# Patient Record
Sex: Female | Born: 1935
Health system: Southern US, Community
[De-identification: ages and names within clinical notes are randomized; demographics above are authoritative.]

## PROBLEM LIST (undated history)

## (undated) DIAGNOSIS — Z8489 Family history of other specified conditions: Secondary | ICD-10-CM

## (undated) DIAGNOSIS — M1990 Unspecified osteoarthritis, unspecified site: Secondary | ICD-10-CM

## (undated) DIAGNOSIS — Z9889 Other specified postprocedural states: Secondary | ICD-10-CM

## (undated) DIAGNOSIS — I5042 Chronic combined systolic (congestive) and diastolic (congestive) heart failure: Secondary | ICD-10-CM

## (undated) DIAGNOSIS — D62 Acute posthemorrhagic anemia: Secondary | ICD-10-CM

## (undated) DIAGNOSIS — K219 Gastro-esophageal reflux disease without esophagitis: Secondary | ICD-10-CM

## (undated) DIAGNOSIS — I4891 Unspecified atrial fibrillation: Secondary | ICD-10-CM

## (undated) DIAGNOSIS — R0602 Shortness of breath: Secondary | ICD-10-CM

## (undated) DIAGNOSIS — K922 Gastrointestinal hemorrhage, unspecified: Secondary | ICD-10-CM

## (undated) DIAGNOSIS — I050 Rheumatic mitral stenosis: Secondary | ICD-10-CM

## (undated) DIAGNOSIS — I272 Pulmonary hypertension, unspecified: Secondary | ICD-10-CM

## (undated) DIAGNOSIS — I639 Cerebral infarction, unspecified: Secondary | ICD-10-CM

## (undated) DIAGNOSIS — I10 Essential (primary) hypertension: Secondary | ICD-10-CM

## (undated) DIAGNOSIS — E785 Hyperlipidemia, unspecified: Secondary | ICD-10-CM

## (undated) DIAGNOSIS — R350 Frequency of micturition: Secondary | ICD-10-CM

## (undated) DIAGNOSIS — D649 Anemia, unspecified: Secondary | ICD-10-CM

## (undated) HISTORY — DX: Chronic combined systolic (congestive) and diastolic (congestive) heart failure: I50.42

## (undated) HISTORY — PX: PATENT FORAMEN OVALE CLOSURE: SHX2181

## (undated) HISTORY — DX: Other specified postprocedural states: Z98.890

## (undated) HISTORY — DX: Pulmonary hypertension, unspecified: I27.20

## (undated) HISTORY — DX: Acute posthemorrhagic anemia: D62

## (undated) HISTORY — PX: ABDOMINAL HYSTERECTOMY: SHX81

## (undated) HISTORY — DX: Rheumatic mitral stenosis: I05.0

## (undated) HISTORY — DX: Anemia, unspecified: D64.9

## (undated) HISTORY — PX: EYE SURGERY: SHX253

## (undated) HISTORY — PX: CHOLECYSTECTOMY: SHX55

## (undated) HISTORY — DX: Gastrointestinal hemorrhage, unspecified: K92.2

## (undated) HISTORY — DX: Cerebral infarction, unspecified: I63.9

---

## 2003-02-21 ENCOUNTER — Other Ambulatory Visit: Admission: RE | Admit: 2003-02-21 | Discharge: 2003-02-21 | Payer: Self-pay | Admitting: Obstetrics and Gynecology

## 2003-02-28 ENCOUNTER — Ambulatory Visit (HOSPITAL_COMMUNITY): Admission: RE | Admit: 2003-02-28 | Discharge: 2003-02-28 | Payer: Self-pay | Admitting: Obstetrics and Gynecology

## 2003-02-28 ENCOUNTER — Encounter: Payer: Self-pay | Admitting: Obstetrics and Gynecology

## 2003-07-16 ENCOUNTER — Encounter: Payer: Self-pay | Admitting: Emergency Medicine

## 2003-07-16 ENCOUNTER — Emergency Department (HOSPITAL_COMMUNITY): Admission: EM | Admit: 2003-07-16 | Discharge: 2003-07-16 | Payer: Self-pay | Admitting: Emergency Medicine

## 2004-02-03 ENCOUNTER — Inpatient Hospital Stay (HOSPITAL_COMMUNITY): Admission: EM | Admit: 2004-02-03 | Discharge: 2004-02-04 | Payer: Self-pay | Admitting: Emergency Medicine

## 2004-02-03 ENCOUNTER — Encounter (INDEPENDENT_AMBULATORY_CARE_PROVIDER_SITE_OTHER): Payer: Self-pay | Admitting: Specialist

## 2004-10-16 ENCOUNTER — Ambulatory Visit (HOSPITAL_COMMUNITY): Admission: RE | Admit: 2004-10-16 | Discharge: 2004-10-16 | Payer: Self-pay | Admitting: Cardiovascular Disease

## 2004-11-20 ENCOUNTER — Inpatient Hospital Stay (HOSPITAL_COMMUNITY): Admission: AD | Admit: 2004-11-20 | Discharge: 2004-11-22 | Payer: Self-pay | Admitting: Cardiovascular Disease

## 2004-11-21 ENCOUNTER — Encounter (INDEPENDENT_AMBULATORY_CARE_PROVIDER_SITE_OTHER): Payer: Self-pay | Admitting: Specialist

## 2005-01-23 ENCOUNTER — Ambulatory Visit (HOSPITAL_COMMUNITY): Admission: RE | Admit: 2005-01-23 | Discharge: 2005-01-23 | Payer: Self-pay | Admitting: Gastroenterology

## 2005-01-23 ENCOUNTER — Encounter (INDEPENDENT_AMBULATORY_CARE_PROVIDER_SITE_OTHER): Payer: Self-pay | Admitting: *Deleted

## 2006-10-18 ENCOUNTER — Encounter: Admission: RE | Admit: 2006-10-18 | Discharge: 2006-10-18 | Payer: Self-pay | Admitting: Cardiovascular Disease

## 2007-08-12 ENCOUNTER — Observation Stay (HOSPITAL_COMMUNITY): Admission: AD | Admit: 2007-08-12 | Discharge: 2007-08-13 | Payer: Self-pay | Admitting: Cardiovascular Disease

## 2008-01-29 ENCOUNTER — Encounter: Admission: RE | Admit: 2008-01-29 | Discharge: 2008-01-29 | Payer: Self-pay | Admitting: Cardiovascular Disease

## 2008-02-14 ENCOUNTER — Encounter: Admission: RE | Admit: 2008-02-14 | Discharge: 2008-02-14 | Payer: Self-pay | Admitting: Cardiovascular Disease

## 2008-02-23 ENCOUNTER — Inpatient Hospital Stay (HOSPITAL_COMMUNITY): Admission: EM | Admit: 2008-02-23 | Discharge: 2008-02-25 | Payer: Self-pay | Admitting: Emergency Medicine

## 2008-12-16 DIAGNOSIS — I4891 Unspecified atrial fibrillation: Secondary | ICD-10-CM

## 2008-12-16 DIAGNOSIS — K922 Gastrointestinal hemorrhage, unspecified: Secondary | ICD-10-CM

## 2008-12-16 HISTORY — DX: Unspecified atrial fibrillation: I48.91

## 2008-12-16 HISTORY — DX: Gastrointestinal hemorrhage, unspecified: K92.2

## 2008-12-23 ENCOUNTER — Inpatient Hospital Stay (HOSPITAL_COMMUNITY): Admission: AD | Admit: 2008-12-23 | Discharge: 2008-12-26 | Payer: Self-pay | Admitting: Cardiovascular Disease

## 2008-12-26 ENCOUNTER — Encounter (INDEPENDENT_AMBULATORY_CARE_PROVIDER_SITE_OTHER): Payer: Self-pay | Admitting: Gastroenterology

## 2009-04-26 ENCOUNTER — Encounter (INDEPENDENT_AMBULATORY_CARE_PROVIDER_SITE_OTHER): Payer: Self-pay | Admitting: Cardiovascular Disease

## 2009-04-26 ENCOUNTER — Ambulatory Visit (HOSPITAL_COMMUNITY): Admission: RE | Admit: 2009-04-26 | Discharge: 2009-04-26 | Payer: Self-pay | Admitting: Cardiovascular Disease

## 2009-05-03 ENCOUNTER — Ambulatory Visit (HOSPITAL_COMMUNITY): Admission: RE | Admit: 2009-05-03 | Discharge: 2009-05-03 | Payer: Self-pay | Admitting: Cardiovascular Disease

## 2009-06-09 ENCOUNTER — Ambulatory Visit: Payer: Self-pay | Admitting: Thoracic Surgery (Cardiothoracic Vascular Surgery)

## 2009-06-30 ENCOUNTER — Encounter
Admission: RE | Admit: 2009-06-30 | Discharge: 2009-06-30 | Payer: Self-pay | Admitting: Thoracic Surgery (Cardiothoracic Vascular Surgery)

## 2009-07-03 ENCOUNTER — Ambulatory Visit: Payer: Self-pay | Admitting: Thoracic Surgery (Cardiothoracic Vascular Surgery)

## 2009-07-05 ENCOUNTER — Ambulatory Visit: Payer: Self-pay | Admitting: Thoracic Surgery (Cardiothoracic Vascular Surgery)

## 2009-07-05 ENCOUNTER — Encounter: Payer: Self-pay | Admitting: Thoracic Surgery (Cardiothoracic Vascular Surgery)

## 2009-07-05 ENCOUNTER — Inpatient Hospital Stay (HOSPITAL_COMMUNITY)
Admission: RE | Admit: 2009-07-05 | Discharge: 2009-07-10 | Payer: Self-pay | Admitting: Thoracic Surgery (Cardiothoracic Vascular Surgery)

## 2009-07-05 DIAGNOSIS — Z9889 Other specified postprocedural states: Secondary | ICD-10-CM

## 2009-07-05 HISTORY — PX: MITRAL VALVE REPAIR: SHX2039

## 2009-07-05 HISTORY — DX: Other specified postprocedural states: Z98.890

## 2009-07-12 ENCOUNTER — Ambulatory Visit: Payer: Self-pay | Admitting: Thoracic Surgery (Cardiothoracic Vascular Surgery)

## 2009-07-12 ENCOUNTER — Encounter
Admission: RE | Admit: 2009-07-12 | Discharge: 2009-07-12 | Payer: Self-pay | Admitting: Thoracic Surgery (Cardiothoracic Vascular Surgery)

## 2009-07-17 ENCOUNTER — Encounter
Admission: RE | Admit: 2009-07-17 | Discharge: 2009-07-17 | Payer: Self-pay | Admitting: Thoracic Surgery (Cardiothoracic Vascular Surgery)

## 2009-07-17 ENCOUNTER — Ambulatory Visit: Payer: Self-pay | Admitting: Thoracic Surgery (Cardiothoracic Vascular Surgery)

## 2009-08-02 ENCOUNTER — Observation Stay (HOSPITAL_COMMUNITY): Admission: AD | Admit: 2009-08-02 | Discharge: 2009-08-03 | Payer: Self-pay | Admitting: Cardiovascular Disease

## 2009-08-14 ENCOUNTER — Encounter
Admission: RE | Admit: 2009-08-14 | Discharge: 2009-08-14 | Payer: Self-pay | Admitting: Thoracic Surgery (Cardiothoracic Vascular Surgery)

## 2009-08-14 ENCOUNTER — Ambulatory Visit: Payer: Self-pay | Admitting: Thoracic Surgery (Cardiothoracic Vascular Surgery)

## 2009-09-20 IMAGING — CR DG KNEE 1-2V*R*
2 series · 2 of 2 positions shown · non-contrast
Comparison: none

CLINICAL DATA: Bilateral knee pain.  
 LEFT KNEE - 2 VIEW:

[t knee ap right]
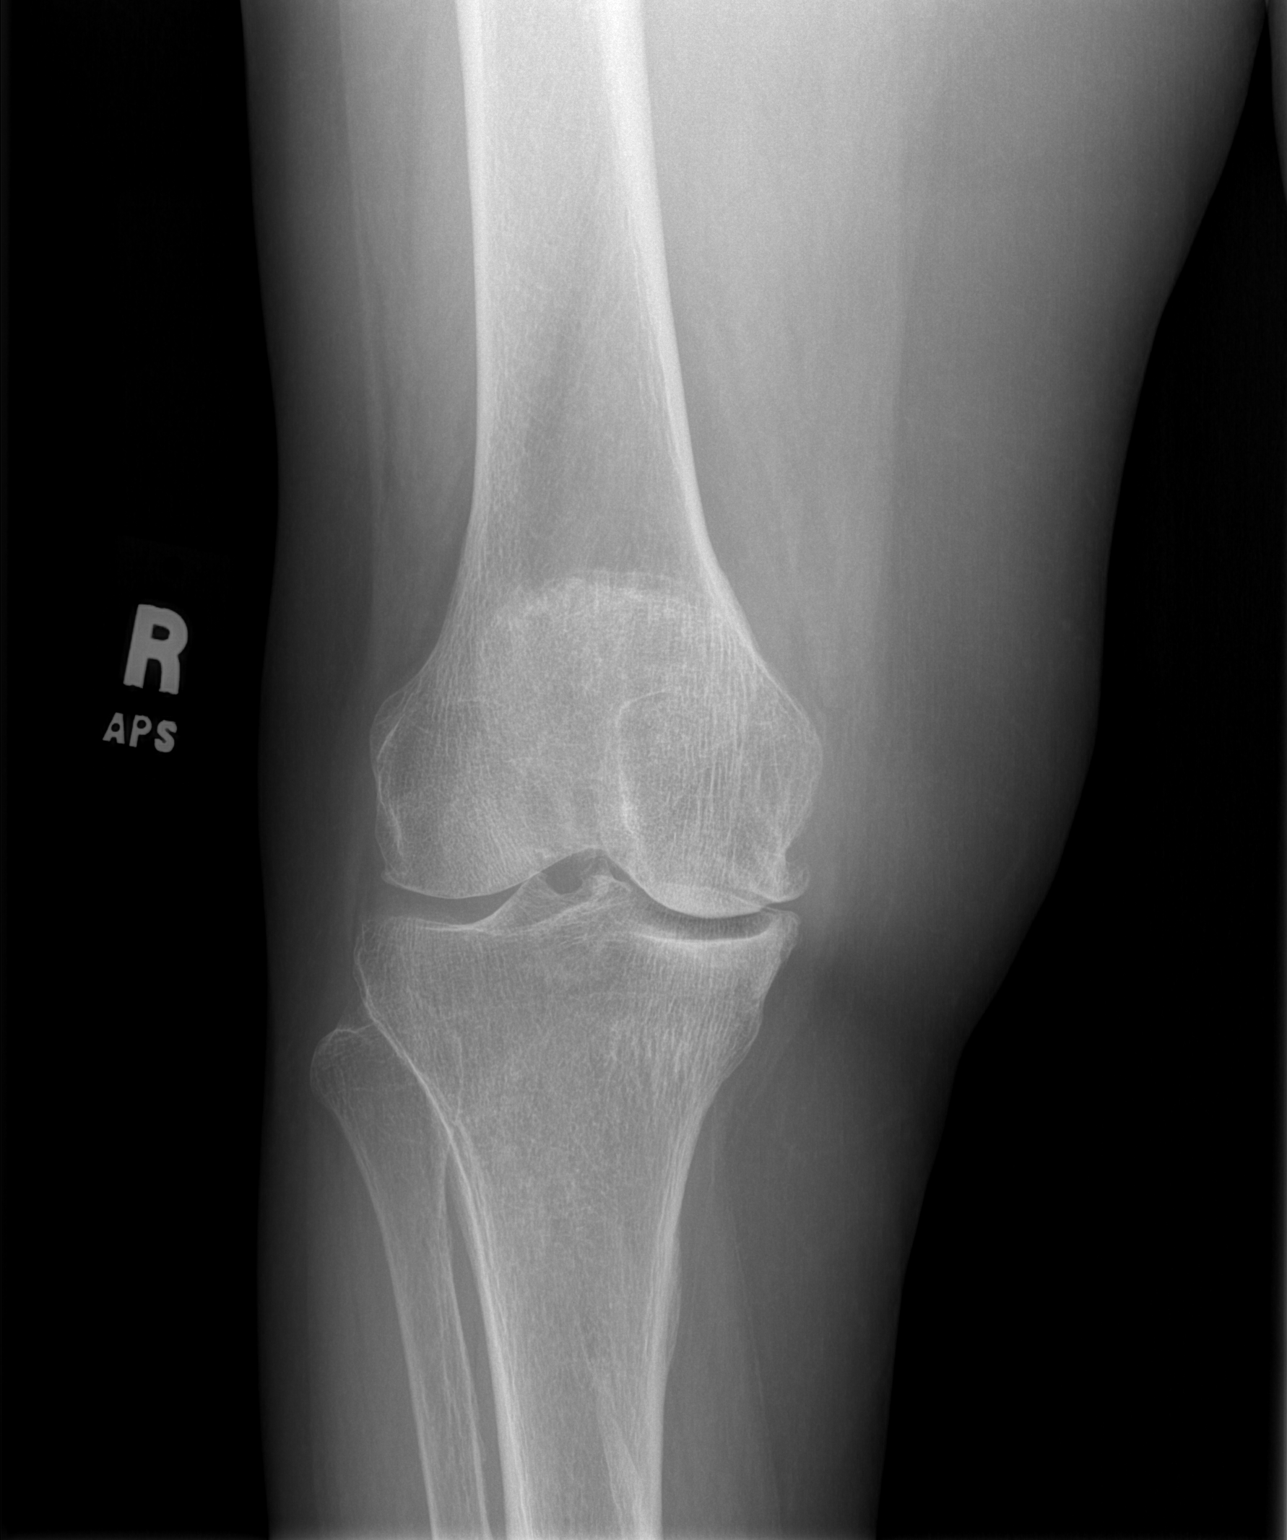

[t knee lat right]
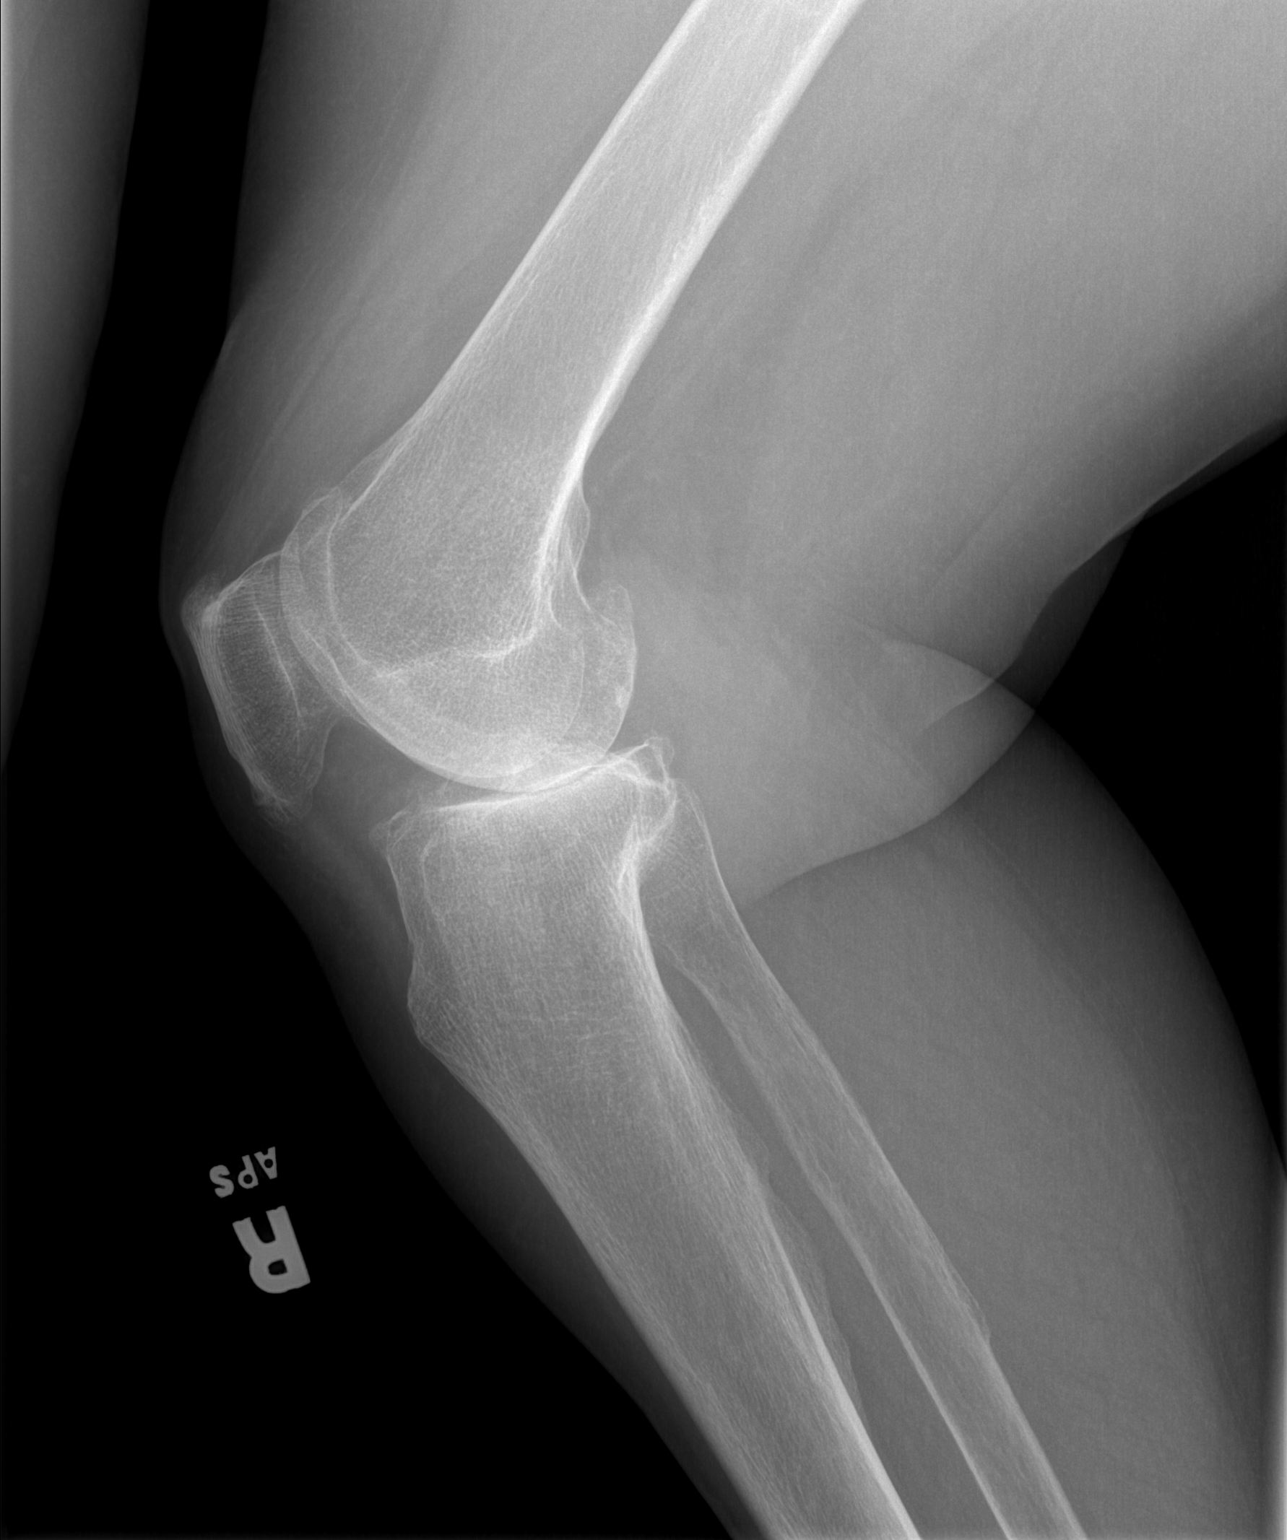

[2 of 2 positions shown; findings below may reference images not displayed]

FINDINGS: Two views of the left knee show significant degenerative change with loss of joint space medially.  Also there is sclerosis and spur formation medially.  No fracture or effusion is seen.   There is a bony density located just above the intercondylar notch of the mid distal femur which appears to be posteriorly positioned on the lateral view and may represent a loose body.
IMPRESSION: Significant degenerative change medially.  Cannot exclude a loose body.  
 RIGHT KNEE - 2 VIEW:
FINDINGS: Two views of the right knee show loss of joint space medially with sclerosis and spur formation consistent with degenerative change.  Mild degenerative change is present at the patellofemoral articulation as well.  No acute abnormality is seen and no effusion is noted.
IMPRESSION: Degenerative change primarily medially.

## 2009-09-20 IMAGING — CR DG KNEE 1-2V*L*
2 series · 2 of 2 positions shown · non-contrast
Comparison: none

CLINICAL DATA: Bilateral knee pain.  
 LEFT KNEE - 2 VIEW:

[t knee ap left]
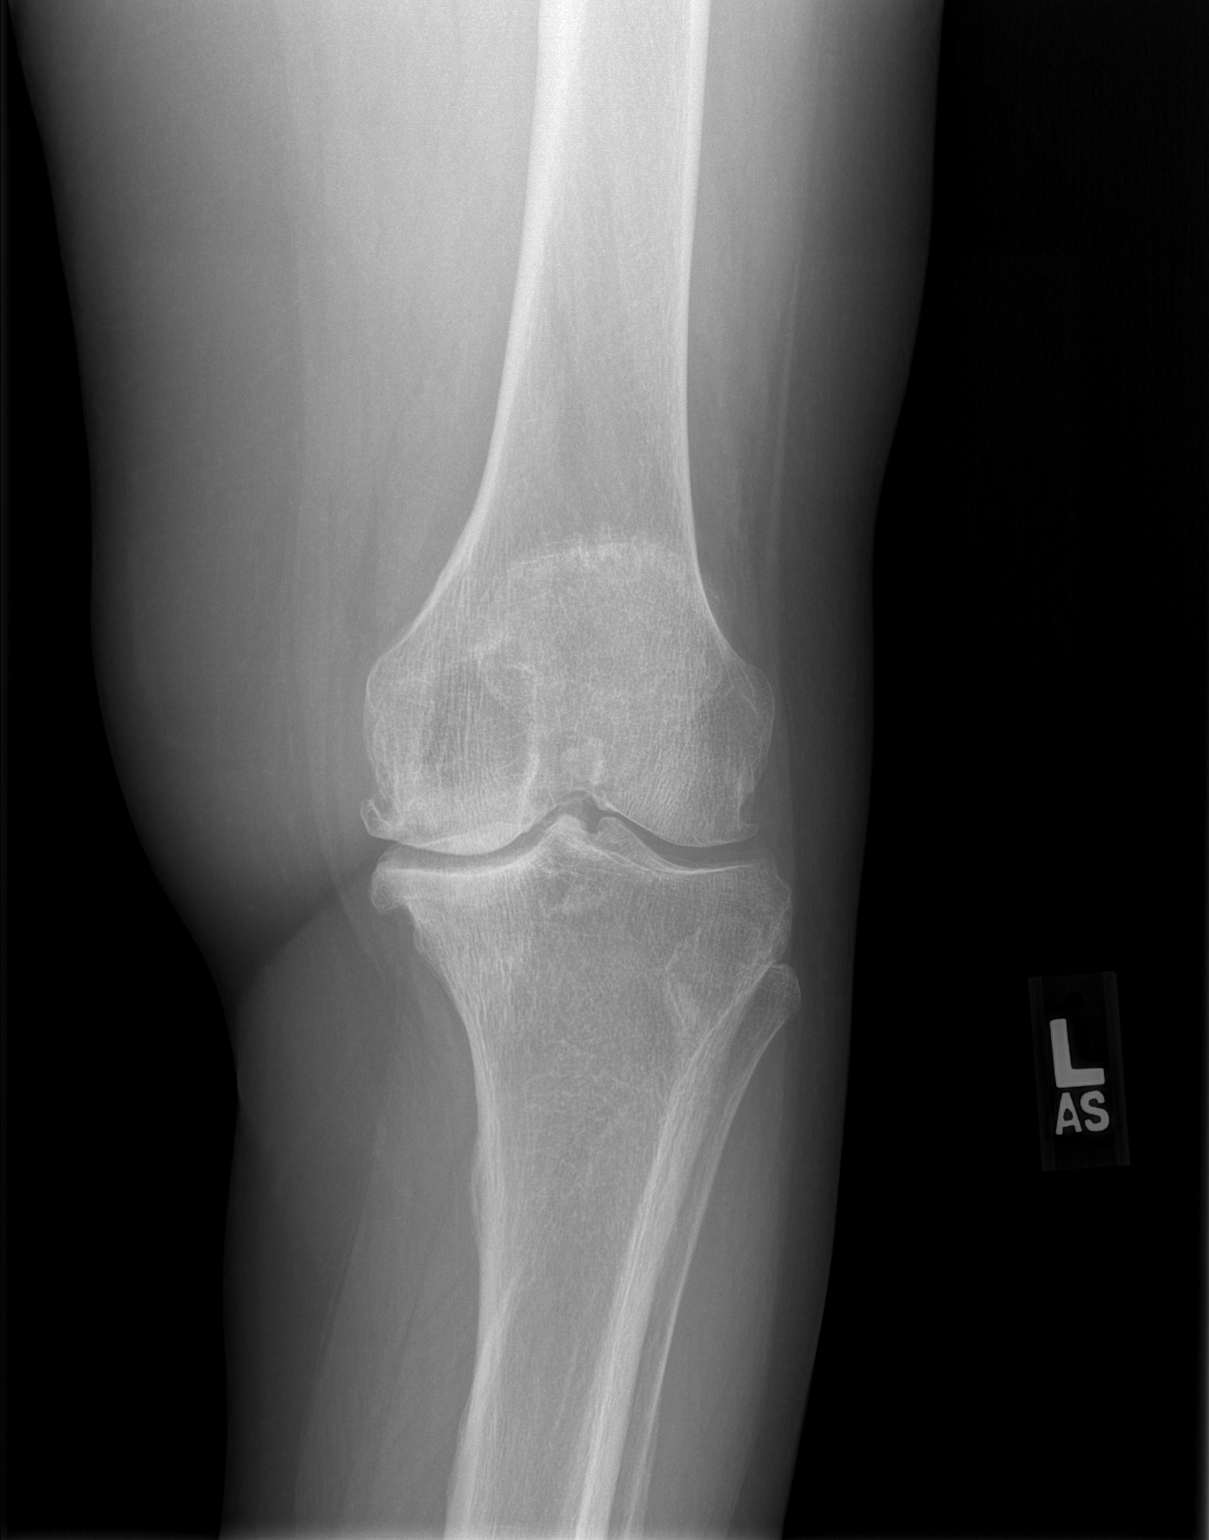

[t knee lat left]
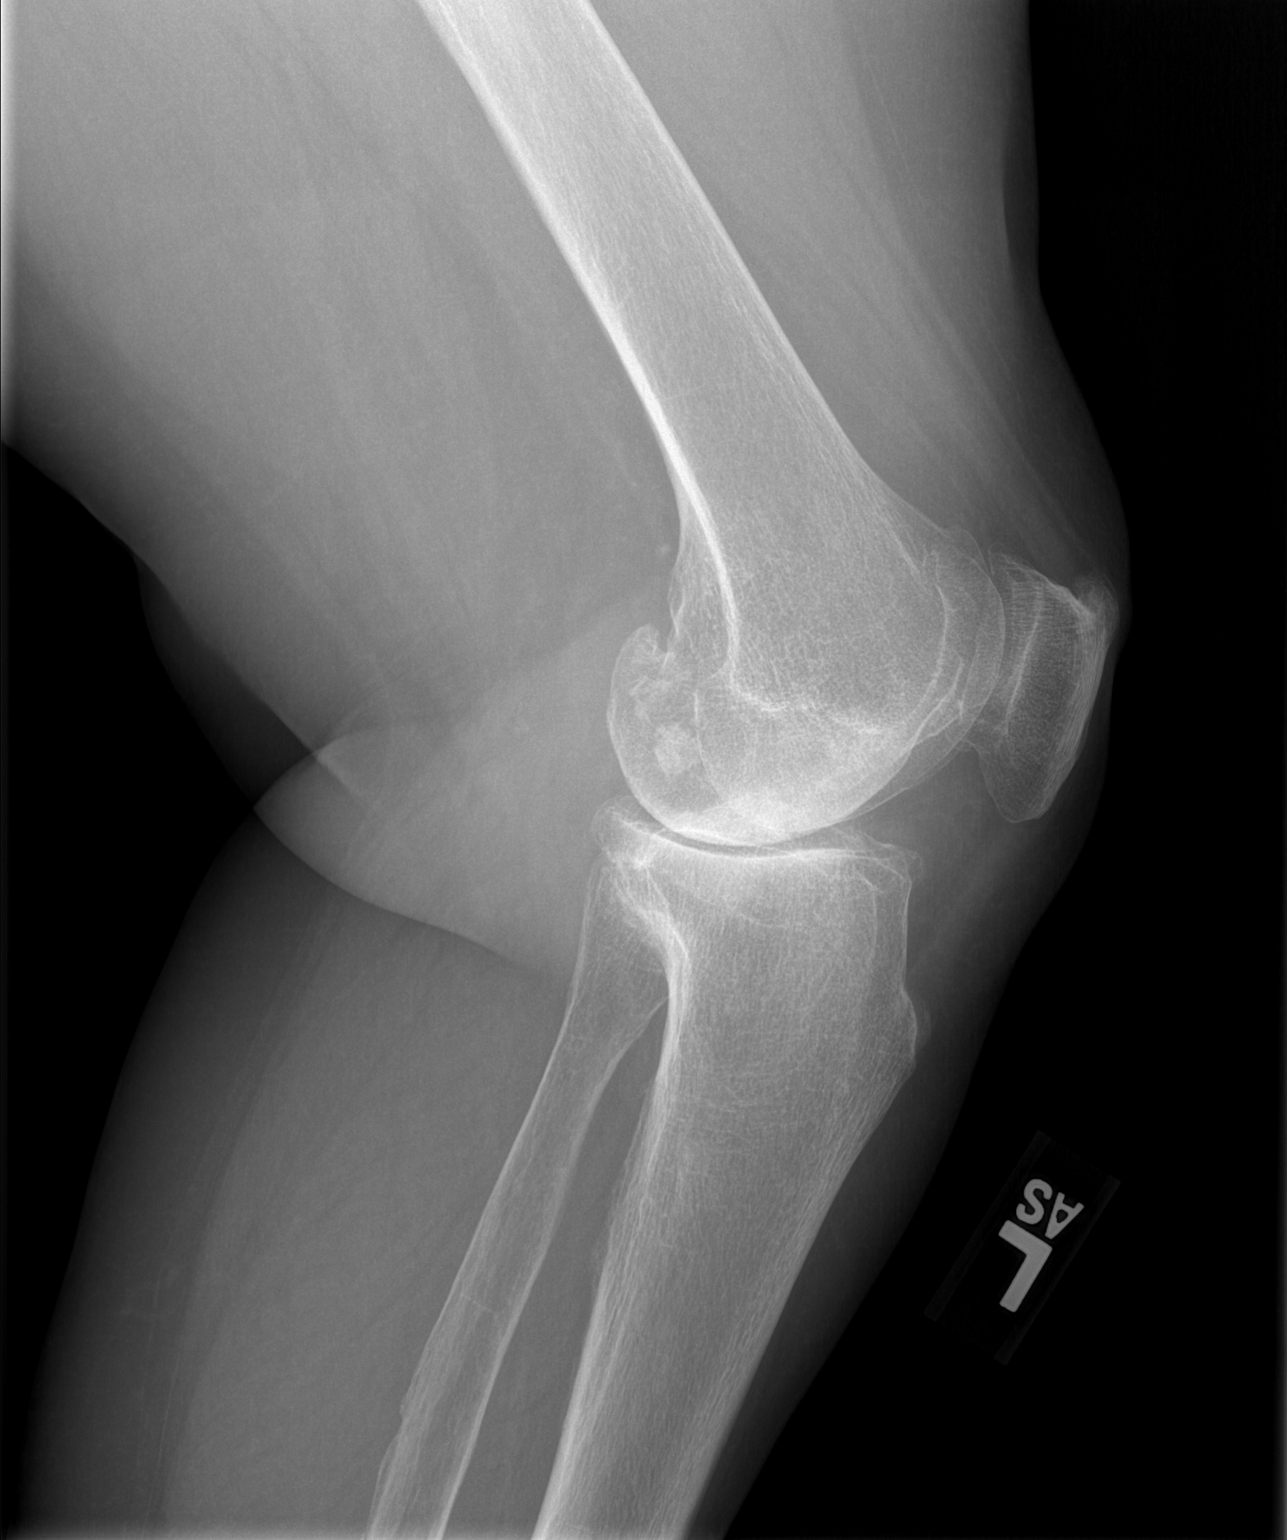

[2 of 2 positions shown; findings below may reference images not displayed]

FINDINGS: Two views of the left knee show significant degenerative change with loss of joint space medially.  Also there is sclerosis and spur formation medially.  No fracture or effusion is seen.   There is a bony density located just above the intercondylar notch of the mid distal femur which appears to be posteriorly positioned on the lateral view and may represent a loose body.
IMPRESSION: Significant degenerative change medially.  Cannot exclude a loose body.  
 RIGHT KNEE - 2 VIEW:
FINDINGS: Two views of the right knee show loss of joint space medially with sclerosis and spur formation consistent with degenerative change.  Mild degenerative change is present at the patellofemoral articulation as well.  No acute abnormality is seen and no effusion is noted.
IMPRESSION: Degenerative change primarily medially.

## 2010-03-05 ENCOUNTER — Ambulatory Visit: Payer: Self-pay | Admitting: Thoracic Surgery (Cardiothoracic Vascular Surgery)

## 2010-10-12 ENCOUNTER — Encounter: Admission: RE | Admit: 2010-10-12 | Discharge: 2010-10-12 | Payer: Self-pay | Admitting: Cardiovascular Disease

## 2011-01-07 ENCOUNTER — Encounter: Payer: Self-pay | Admitting: Thoracic Surgery (Cardiothoracic Vascular Surgery)

## 2011-02-21 ENCOUNTER — Other Ambulatory Visit (HOSPITAL_COMMUNITY): Payer: Self-pay | Admitting: Neurosurgery

## 2011-02-21 DIAGNOSIS — M542 Cervicalgia: Secondary | ICD-10-CM

## 2011-02-21 DIAGNOSIS — G930 Cerebral cysts: Secondary | ICD-10-CM

## 2011-02-21 DIAGNOSIS — G950 Syringomyelia and syringobulbia: Secondary | ICD-10-CM

## 2011-02-26 ENCOUNTER — Other Ambulatory Visit (HOSPITAL_COMMUNITY): Payer: Self-pay

## 2011-03-01 IMAGING — CR DG CHEST 2V
2 series · 2 of 2 positions shown · non-contrast
Comparison: 07/08/2009

CLINICAL DATA: Mitral valve replacement, recent chest tube removal

CHEST - 2 VIEW

[w chest pa]
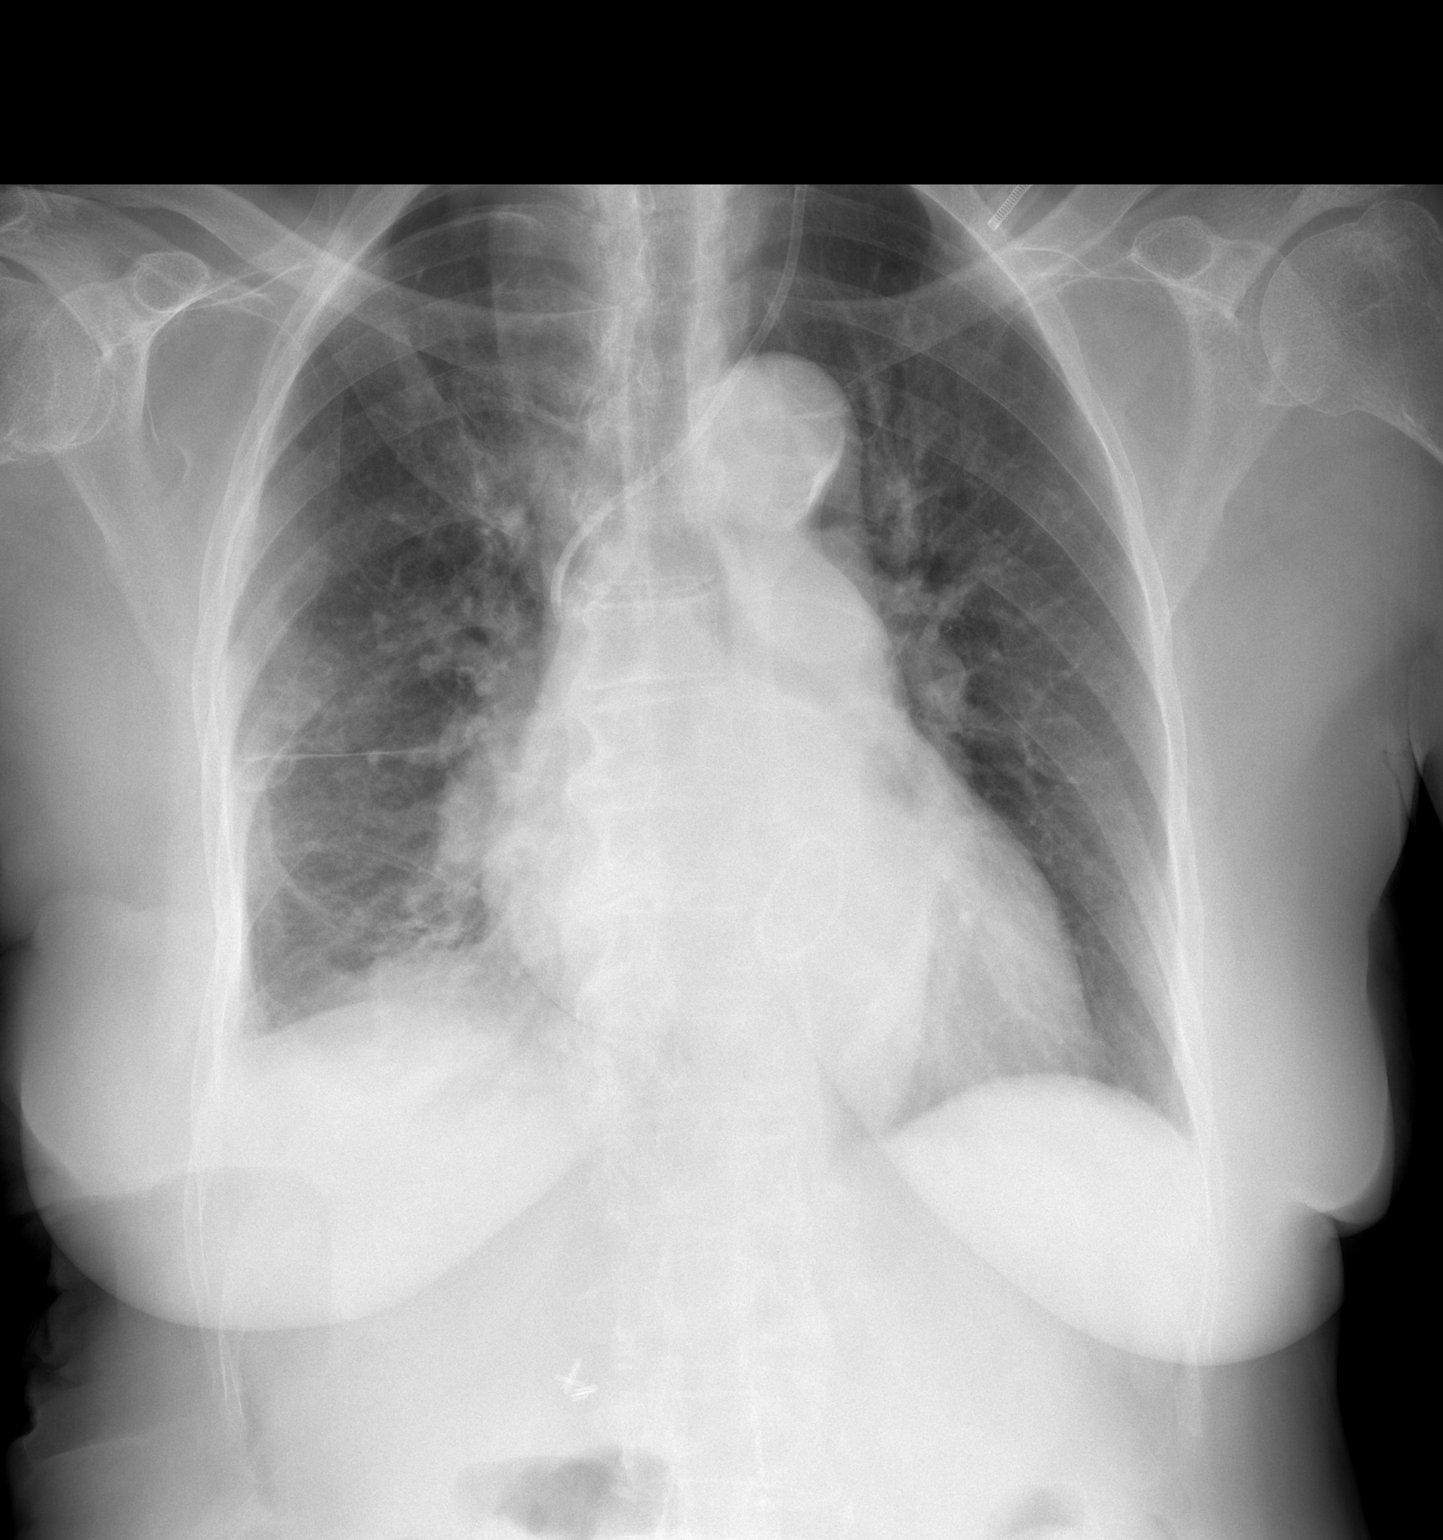

[w chest lat]
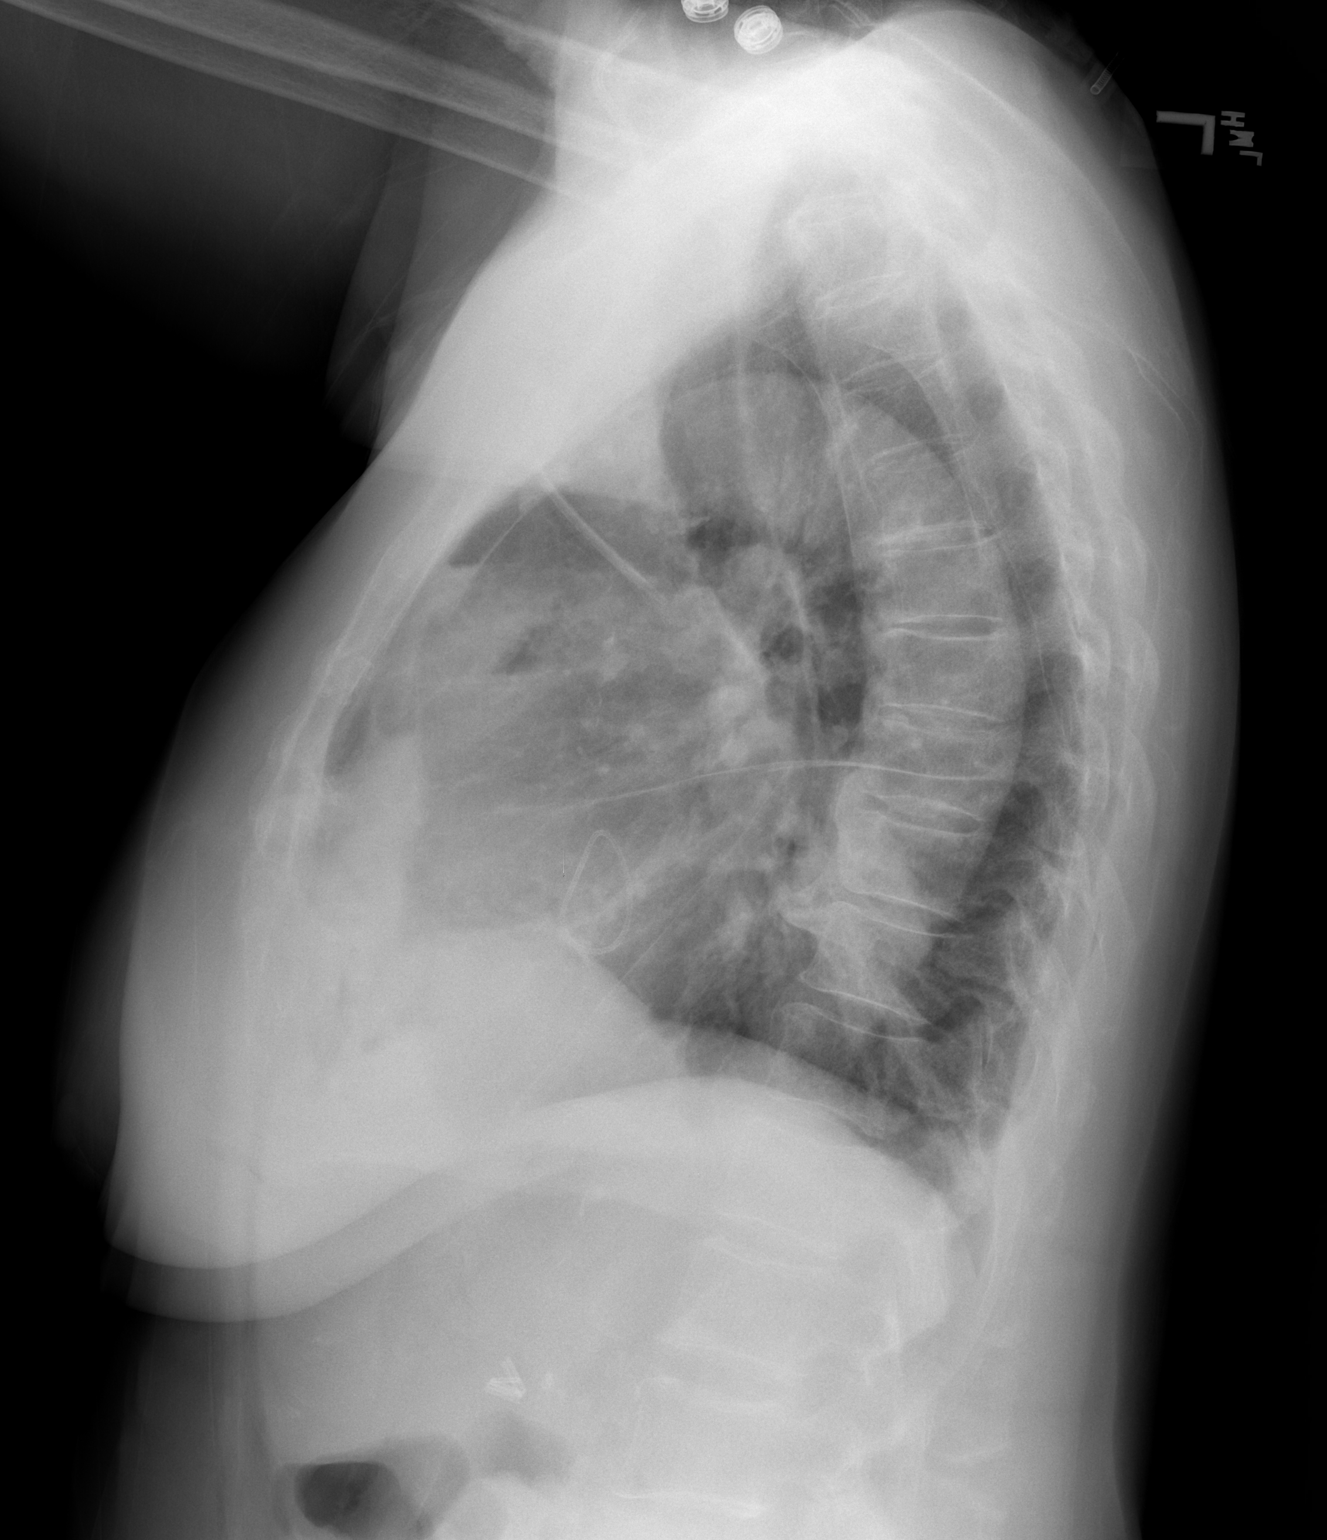

[2 of 2 positions shown; findings below may reference images not displayed]

FINDINGS: Stable small residual right apical pneumothorax.  Stable
cardiac enlargement status post mitral valve replacement.  Aorta is
atherosclerotic and ectatic.  Left IJ central line tip remains in
the upper SVC.  Right base atelectasis verses airspace disease
persist with residual right pleural fluid.
IMPRESSION: Persistent stable right apical pneumothorax

## 2011-03-23 LAB — DIFFERENTIAL
Eosinophils Absolute: 0.1 10*3/uL (ref 0.0–0.7)
Lymphocytes Relative: 16 % (ref 12–46)
Lymphs Abs: 1.1 10*3/uL (ref 0.7–4.0)
Neutro Abs: 4.9 10*3/uL (ref 1.7–7.7)
Neutrophils Relative %: 73 % (ref 43–77)

## 2011-03-23 LAB — COMPREHENSIVE METABOLIC PANEL
CO2: 24 mEq/L (ref 19–32)
Calcium: 9.3 mg/dL (ref 8.4–10.5)
Creatinine, Ser: 1.42 mg/dL — ABNORMAL HIGH (ref 0.4–1.2)
GFR calc non Af Amer: 36 mL/min — ABNORMAL LOW (ref >60)
Glucose, Bld: 97 mg/dL (ref 70–99)
Total Protein: 8.2 g/dL (ref 6.0–8.3)

## 2011-03-23 LAB — CBC
Hemoglobin: 12.3 g/dL (ref 12.0–15.0)
MCHC: 32.5 g/dL (ref 30.0–36.0)
MCV: 85.9 fL (ref 78.0–100.0)
RBC: 4.39 MIL/uL (ref 3.87–5.11)
RDW: 14 % (ref 11.5–15.5)

## 2011-03-23 LAB — PROTIME-INR
INR: 4.8 — ABNORMAL HIGH (ref 0.00–1.49)
Prothrombin Time: 44.5 seconds — ABNORMAL HIGH (ref 11.6–15.2)

## 2011-03-24 LAB — CBC
HCT: 31 % — ABNORMAL LOW (ref 36.0–46.0)
HCT: 33.1 % — ABNORMAL LOW (ref 36.0–46.0)
HCT: 33.9 % — ABNORMAL LOW (ref 36.0–46.0)
HCT: 34.5 % — ABNORMAL LOW (ref 36.0–46.0)
HCT: 35.3 % — ABNORMAL LOW (ref 36.0–46.0)
HCT: 40.4 % (ref 36.0–46.0)
Hemoglobin: 11 g/dL — ABNORMAL LOW (ref 12.0–15.0)
Hemoglobin: 11.6 g/dL — ABNORMAL LOW (ref 12.0–15.0)
MCHC: 33 g/dL (ref 30.0–36.0)
MCHC: 33 g/dL (ref 30.0–36.0)
MCHC: 33.3 g/dL (ref 30.0–36.0)
MCHC: 33.5 g/dL (ref 30.0–36.0)
MCHC: 33.6 g/dL (ref 30.0–36.0)
MCHC: 34.2 g/dL (ref 30.0–36.0)
MCV: 87.2 fL (ref 78.0–100.0)
MCV: 87.6 fL (ref 78.0–100.0)
MCV: 87.8 fL (ref 78.0–100.0)
MCV: 87.8 fL (ref 78.0–100.0)
MCV: 87.9 fL (ref 78.0–100.0)
MCV: 88.3 fL (ref 78.0–100.0)
Platelets: 141 10*3/uL — ABNORMAL LOW (ref 150–400)
Platelets: 146 10*3/uL — ABNORMAL LOW (ref 150–400)
Platelets: 181 10*3/uL (ref 150–400)
Platelets: 181 10*3/uL (ref 150–400)
Platelets: 196 10*3/uL (ref 150–400)
RBC: 3.77 MIL/uL — ABNORMAL LOW (ref 3.87–5.11)
RBC: 3.93 MIL/uL (ref 3.87–5.11)
RBC: 4.61 MIL/uL (ref 3.87–5.11)
RDW: 13.6 % (ref 11.5–15.5)
RDW: 13.8 % (ref 11.5–15.5)
RDW: 13.8 % (ref 11.5–15.5)
RDW: 14 % (ref 11.5–15.5)
WBC: 11.9 10*3/uL — ABNORMAL HIGH (ref 4.0–10.5)
WBC: 16.9 10*3/uL — ABNORMAL HIGH (ref 4.0–10.5)
WBC: 5.7 10*3/uL (ref 4.0–10.5)

## 2011-03-24 LAB — TYPE AND SCREEN

## 2011-03-24 LAB — POCT I-STAT 4, (NA,K, GLUC, HGB,HCT)
Glucose, Bld: 106 mg/dL — ABNORMAL HIGH (ref 70–99)
Glucose, Bld: 124 mg/dL — ABNORMAL HIGH (ref 70–99)
Glucose, Bld: 142 mg/dL — ABNORMAL HIGH (ref 70–99)
Glucose, Bld: 89 mg/dL (ref 70–99)
HCT: 24 % — ABNORMAL LOW (ref 36.0–46.0)
HCT: 32 % — ABNORMAL LOW (ref 36.0–46.0)
HCT: 35 % — ABNORMAL LOW (ref 36.0–46.0)
HCT: 38 % (ref 36.0–46.0)
Hemoglobin: 10.9 g/dL — ABNORMAL LOW (ref 12.0–15.0)
Hemoglobin: 12.9 g/dL (ref 12.0–15.0)
Hemoglobin: 8.2 g/dL — ABNORMAL LOW (ref 12.0–15.0)
Hemoglobin: 8.8 g/dL — ABNORMAL LOW (ref 12.0–15.0)
Potassium: 3.6 mEq/L (ref 3.5–5.1)
Potassium: 5.1 mEq/L (ref 3.5–5.1)
Sodium: 134 mEq/L — ABNORMAL LOW (ref 135–145)
Sodium: 135 mEq/L (ref 135–145)

## 2011-03-24 LAB — PREPARE FRESH FROZEN PLASMA

## 2011-03-24 LAB — POCT I-STAT, CHEM 8
BUN: 10 mg/dL (ref 6–23)
Calcium, Ion: 1.1 mmol/L — ABNORMAL LOW (ref 1.12–1.32)
Creatinine, Ser: 0.9 mg/dL (ref 0.4–1.2)
Hemoglobin: 11.6 g/dL — ABNORMAL LOW (ref 12.0–15.0)
Sodium: 136 mEq/L (ref 135–145)
TCO2: 25 mmol/L (ref 0–100)

## 2011-03-24 LAB — POCT I-STAT 3, ART BLOOD GAS (G3+)
Acid-base deficit: 1 mmol/L (ref 0.0–2.0)
Bicarbonate: 20.7 mEq/L (ref 20.0–24.0)
Bicarbonate: 23.4 mEq/L (ref 20.0–24.0)
O2 Saturation: 100 %
O2 Saturation: 94 %
O2 Saturation: 99 %
Patient temperature: 100.8
TCO2: 22 mmol/L (ref 0–100)
TCO2: 24 mmol/L (ref 0–100)
TCO2: 24 mmol/L (ref 0–100)
pCO2 arterial: 33.3 mmHg — ABNORMAL LOW (ref 35.0–45.0)
pCO2 arterial: 37 mmHg (ref 35.0–45.0)
pCO2 arterial: 42 mmHg (ref 35.0–45.0)
pH, Arterial: 7.408 — ABNORMAL HIGH (ref 7.350–7.400)
pO2, Arterial: 107 mmHg — ABNORMAL HIGH (ref 80.0–100.0)
pO2, Arterial: 298 mmHg — ABNORMAL HIGH (ref 80.0–100.0)
pO2, Arterial: 74 mmHg — ABNORMAL LOW (ref 80.0–100.0)

## 2011-03-24 LAB — BASIC METABOLIC PANEL
BUN: 14 mg/dL (ref 6–23)
BUN: 18 mg/dL (ref 6–23)
CO2: 24 mEq/L (ref 19–32)
CO2: 26 mEq/L (ref 19–32)
CO2: 28 mEq/L (ref 19–32)
Calcium: 8.9 mg/dL (ref 8.4–10.5)
Chloride: 107 mEq/L (ref 96–112)
Chloride: 95 mEq/L — ABNORMAL LOW (ref 96–112)
GFR calc Af Amer: >60 mL/min (ref >60)
Glucose, Bld: 120 mg/dL — ABNORMAL HIGH (ref 70–99)
Glucose, Bld: 97 mg/dL (ref 70–99)
Potassium: 3.2 mEq/L — ABNORMAL LOW (ref 3.5–5.1)
Potassium: 3.8 mEq/L (ref 3.5–5.1)
Potassium: 3.9 mEq/L (ref 3.5–5.1)
Sodium: 131 mEq/L — ABNORMAL LOW (ref 135–145)
Sodium: 136 mEq/L (ref 135–145)

## 2011-03-24 LAB — PREPARE PLATELETS

## 2011-03-24 LAB — CREATININE, SERUM
Creatinine, Ser: 0.77 mg/dL (ref 0.4–1.2)
GFR calc Af Amer: >60 mL/min (ref >60)

## 2011-03-24 LAB — BLOOD GAS, ARTERIAL
Acid-Base Excess: 0.4 mmol/L (ref 0.0–2.0)
FIO2: 0.21 %
pCO2 arterial: 37.3 mmHg (ref 35.0–45.0)
pH, Arterial: 7.428 — ABNORMAL HIGH (ref 7.350–7.400)

## 2011-03-24 LAB — DIFFERENTIAL
Basophils Absolute: 0 10*3/uL (ref 0.0–0.1)
Lymphocytes Relative: 30 % (ref 12–46)
Neutro Abs: 3.4 10*3/uL (ref 1.7–7.7)
Neutrophils Relative %: 60 % (ref 43–77)

## 2011-03-24 LAB — HEMOGLOBIN AND HEMATOCRIT, BLOOD
HCT: 25.5 % — ABNORMAL LOW (ref 36.0–46.0)
Hemoglobin: 8.6 g/dL — ABNORMAL LOW (ref 12.0–15.0)

## 2011-03-24 LAB — COMPREHENSIVE METABOLIC PANEL
BUN: 12 mg/dL (ref 6–23)
CO2: 23 mEq/L (ref 19–32)
Chloride: 107 mEq/L (ref 96–112)
Creatinine, Ser: 1.11 mg/dL (ref 0.4–1.2)
GFR calc non Af Amer: 48 mL/min — ABNORMAL LOW (ref >60)
Total Bilirubin: 0.5 mg/dL (ref 0.3–1.2)

## 2011-03-24 LAB — HEMOGLOBIN A1C: Hgb A1c MFr Bld: 5.2 % (ref 4.6–6.1)

## 2011-03-24 LAB — PROTIME-INR
INR: 1.1 (ref 0.00–1.49)
Prothrombin Time: 14.3 seconds (ref 11.6–15.2)
Prothrombin Time: 16 seconds — ABNORMAL HIGH (ref 11.6–15.2)

## 2011-03-24 LAB — URINALYSIS, ROUTINE W REFLEX MICROSCOPIC
Glucose, UA: NEGATIVE mg/dL
Ketones, ur: NEGATIVE mg/dL
Protein, ur: NEGATIVE mg/dL

## 2011-03-24 LAB — GLUCOSE, CAPILLARY
Glucose-Capillary: 108 mg/dL — ABNORMAL HIGH (ref 70–99)
Glucose-Capillary: 129 mg/dL — ABNORMAL HIGH (ref 70–99)

## 2011-03-24 LAB — MAGNESIUM: Magnesium: 3 mg/dL — ABNORMAL HIGH (ref 1.5–2.5)

## 2011-04-01 LAB — CBC
HCT: 29 % — ABNORMAL LOW (ref 36.0–46.0)
HCT: 29 % — ABNORMAL LOW (ref 36.0–46.0)
HCT: 32 % — ABNORMAL LOW (ref 36.0–46.0)
Hemoglobin: 6.4 g/dL — CL (ref 12.0–15.0)
MCHC: 30.8 g/dL (ref 30.0–36.0)
MCHC: 31.3 g/dL (ref 30.0–36.0)
MCV: 72.6 fL — ABNORMAL LOW (ref 78.0–100.0)
Platelets: 374 10*3/uL (ref 150–400)
Platelets: 380 10*3/uL (ref 150–400)
Platelets: 383 10*3/uL (ref 150–400)
Platelets: 401 10*3/uL — ABNORMAL HIGH (ref 150–400)
RBC: 4.4 MIL/uL (ref 3.87–5.11)
RDW: 18.8 % — ABNORMAL HIGH (ref 11.5–15.5)
RDW: 21.7 % — ABNORMAL HIGH (ref 11.5–15.5)
RDW: 22.3 % — ABNORMAL HIGH (ref 11.5–15.5)
WBC: 7.6 10*3/uL (ref 4.0–10.5)

## 2011-04-01 LAB — COMPREHENSIVE METABOLIC PANEL
AST: 15 U/L (ref 0–37)
Albumin: 3.4 g/dL — ABNORMAL LOW (ref 3.5–5.2)
BUN: 17 mg/dL (ref 6–23)
Calcium: 9.7 mg/dL (ref 8.4–10.5)
Creatinine, Ser: 1.14 mg/dL (ref 0.4–1.2)
GFR calc Af Amer: 57 mL/min — ABNORMAL LOW (ref >60)
Total Bilirubin: 0.7 mg/dL (ref 0.3–1.2)
Total Protein: 6.5 g/dL (ref 6.0–8.3)

## 2011-04-01 LAB — URINALYSIS, ROUTINE W REFLEX MICROSCOPIC
Bilirubin Urine: NEGATIVE
Ketones, ur: NEGATIVE mg/dL
Protein, ur: NEGATIVE mg/dL
Specific Gravity, Urine: 1.012 (ref 1.005–1.030)

## 2011-04-01 LAB — BASIC METABOLIC PANEL
BUN: 19 mg/dL (ref 6–23)
CO2: 24 mEq/L (ref 19–32)
Calcium: 9.7 mg/dL (ref 8.4–10.5)
Creatinine, Ser: 1.17 mg/dL (ref 0.4–1.2)
Glucose, Bld: 96 mg/dL (ref 70–99)

## 2011-04-01 LAB — URINE MICROSCOPIC-ADD ON

## 2011-04-01 LAB — URINE CULTURE: Colony Count: NO GROWTH

## 2011-04-01 LAB — HEMOCCULT GUIAC POC 1CARD (OFFICE): Fecal Occult Bld: NEGATIVE

## 2011-04-01 LAB — CROSSMATCH
ABO/RH(D): O POS
Antibody Screen: NEGATIVE

## 2011-04-03 ENCOUNTER — Ambulatory Visit
Admission: RE | Admit: 2011-04-03 | Discharge: 2011-04-03 | Disposition: A | Discharge status: Home / Self Care | Payer: Medicare Other | Source: Ambulatory Visit | Attending: Cardiovascular Disease | Admitting: Cardiovascular Disease

## 2011-04-03 ENCOUNTER — Other Ambulatory Visit: Payer: Self-pay | Admitting: Cardiovascular Disease

## 2011-04-03 DIAGNOSIS — M542 Cervicalgia: Secondary | ICD-10-CM

## 2011-04-30 NOTE — Consult Note (Signed)
NAMELAMANDA, [name]                  ACCOUNT NO.:  [id number]   MEDICAL RECORD NO.:  [id number]          PATIENT TYPE:  INP   LOCATION:  6526                         FACILITY:  [facility]   PHYSICIAN:  John C. Madilyn Fireman, M.D.    DATE OF BIRTH:  06/17/1934   DATE OF CONSULTATION:  02/24/2008  DATE OF DISCHARGE:                                 CONSULTATION   REASON FOR CONSULTATION:  GI bleeding.   HISTORY OF PRESENT ILLNESS:  The patient is a 77 year-old black female  who presents with a three-day history of weakness, black stools and  found to have a hemoglobin of 4.9 on admission which has risen to 9.1  after transfusion of 3 units packed red blood cells.  Her BUN initially  was 29 with creatinine of 0.9 and today her BUN is 15 with creatinine of  0.8.  She denies any abdominal pain.  She had an NSAID-related antral  ulcer found by Dr. Colin Benton in 10/2004 and followed to the healing by  repeat EGD in 01/2005.  At that time, a colonoscopy was done which  showed only a small tiny hyperplastic polyp.  The patient currently  denies using nonsteroidal anti-inflammatory drugs and denies any  abdominal pain.   PAST MEDICAL HISTORY:  1. Hypertension.  2. Hyperlipidemia.  3. Heart murmur.   PAST SURGICAL HISTORY:  Cholecystectomy.   MEDICATIONS:  1. Meclizine.  2. Omeprazole.  3. Cardia XT.  4. Zocor.  5. Nitroglycerin.  6. Hydrochlorothiazide.  7. Coumadin.   SOCIAL HISTORY:  The patient is widowed.  She denies alcohol or tobacco  use.   ALLERGIES:  NONE KNOWN.   PHYSICAL EXAMINATION:  GENERAL:  A well-developed, well-nourished black  female in no acute distress.  HEART:  Regular rate and rhythm without murmur.  LUNGS:  Clear.  ABDOMEN:  Soft, nondistended with normoactive bowel sounds.  No  hepatosplenomegaly, mass or guarding.   IMPRESSION:  Gastrointestinal bleeding with upper source suggested,  exacerbated by use of Coumadin.   PLAN:  Will proceed with EGD tomorrow.          ______________________________  Everardo All Madilyn Fireman, M.D.     J[facility]/MEDQ  D:  02/24/2008  T:  02/25/2008  Job:  [id number]

## 2011-04-30 NOTE — Op Note (Signed)
[name], [name]                  ACCOUNT NO.:  [id number]   MEDICAL RECORD NO.:  [id number]          PATIENT TYPE:  INP   LOCATION:  3705                         FACILITY:  [facility]   PHYSICIAN:  John C. Madilyn Fireman, M.D.    DATE OF BIRTH:  24-Jan-1934   DATE OF PROCEDURE:  DATE OF DISCHARGE:                               OPERATIVE REPORT   INDICATION FOR PROCEDURE:  Persistent anemia and recurrent drops in  hemoglobin with last colonoscopy 4-1/2 years ago and EGD prior to this  procedure unrevealing.   PROCEDURE:  The patient was placed in the left lateral decubitus  position and placed on the pulse monitor with continuous low-flow oxygen  delivered by nasal cannula.  She was sedated with 50 mcg IV fentanyl and  5 mg IV Versed in addition to the medicine given for the previous EGD.  The Olympus video colonoscope  was inserted into the rectum and advanced  through the cecum, confirmed by transillumination of McBurney's point  and visualization of the ileocecal valve and appendiceal orifice.  The  prep was excellent.  The cecum and ascending colon appeared normal with  no masses, polyps, diverticula, or other mucosal abnormalities.  Within  the transverse colon, there was a fairly large pedunculated polyp  approximately 1.5 cm in diameter, which was removed by snare and  retrieved in one piece through the rectum.  The remainder of the  transverse, descending, sigmoid, and rectum were normal.  No further  polyps, masses, diverticula, or other mucosal abnormalities.  The scope  was then withdrawn and the patient returned to the recovery room in  stable condition.  She tolerated the procedure well.  There were no  immediate complications.   IMPRESSION:  Large transverse colon polyp, otherwise normal study.   PLAN:  Await histology to determine interval for next colonoscopy.  If  she has recurrent documented bleeding in the future, will need capsule  endoscopy.     ______________________________  Everardo All. Madilyn Fireman, M.D.     [facility]/MEDQ  D:  12/26/2008  T:  12/27/2008  Job:  [id number]

## 2011-04-30 NOTE — H&P (Signed)
[name], [name]                  ACCOUNT NO.:  [id number]   MEDICAL RECORD NO.:  [id number]          PATIENT TYPE:  INP   LOCATION:  3705                         FACILITY:  [facility]   PHYSICIAN:  Ricki Rodriguez, M.D.  DATE OF BIRTH:  1934/07/06   DATE OF ADMISSION:  12/23/2008  DATE OF DISCHARGE:                              HISTORY & PHYSICAL   CHIEF COMPLAINT:  Shortness of breath and weakness.   HISTORY OF PRESENT ILLNESS:  This 77 year old black female with a known  history of anemia of blood loss had exertional dyspnea.  She did had  black tarry stools a few days ago and has normal bowel movements for  last 1 week.  The patient denies any active bleeding at the present  time.  However, she felt weak and dizzy for last few days.  Her  hemoglobin was down to 6.5 mg/dL today.  The patient has been off  Coumadin for long time and has not had significant aspirin or ibuprofen  intake.   PAST MEDICAL HISTORY:  Negative for diabetes.  Positive for  hypertension.  Negative for smoking and negative for alcohol intake.  Negative for exercise.  Positive for elevated cholesterol level.   PAST SURGICAL HISTORY:  Include hysterectomy 29 years ago, left little  toe surgery 14 years ago for hammer toe deformity, cholecystectomy in  February 2005, EGD in December 2005, and colonoscopy in March 2009.   FAMILY HISTORY:  Noncontributory.   SOCIAL HISTORY:  The patient is widow.  She has 97 son, 69 year old; and  1 daughter, 30 year old.   DRUG ALLERGY:  Include high-dose ASPIRIN causing bleeding.   CURRENT MEDICATIONS:  1. Diltiazem Extended Release 240 mg 1 daily.  2. Meclizine 12.5 mg 1 daily.  3. Metoprolol 50 mg half twice daily.  4. Ferrous sulfate 325 mg 1 daily, recently resumed.  5. Zocor 40 mg 1 daily.  6. Hydrochlorothiazide 12.5 mg daily.  7. K-Dur 10 mEq 2 in the morning and 1 in the evening.  8. Prilosec 20 mg 1 daily.  9. Tylenol as needed.  10.Nitroglycerin 0.4 mg 1 tablet  sublingually every 5 minutes x3 as      needed for chest pain.  11.Multivitamin with iron 1 daily.  12.Calcium 600 mg plus vitamin D 1 daily.   REVIEW OF SYSTEMS:  The patient denies recent weight gain or weight  loss.  No history of asthma.  No history of hemoptysis.  Positive  history of dyspnea.  Positive history of chest pain.  Positive history  of GI bleed.  Negative history of kidney stone.  Negative history of  stroke, seizures, or psychiatric admissions.   PHYSICAL EXAMINATION:  VITAL SIGNS:  Temperature 98.7, pulse 75,  respirations 18, blood pressure 116/73, and oxygen saturations 100% on  room air.  GENERAL:  The patient is short and averagely nourished black female in  no acute distress.  HEENT:  The patient is normocephalic and atraumatic.  Eyes:  Brown eyes.  Conjunctivae pale.  Tongue pale.  Sclerae white.  NECK:  No JVD.  LUNGS:  Clear bilaterally.  HEART:  Normal S1, S2 with a grade 3/6 systolic murmur at left sternal  border and apical area.  ABDOMEN:  Soft.  EXTREMITIES:  Trace edema.  No cyanosis or clubbing.  SKIN:  Warm and dry.  NEUROLOGIC:  Cranial nerves are grossly intact.  The patient moves all 4  extremities.   LABORATORY DATA:  Hemoglobin 6.4, hematocrit 20.7, WBC count normal, and  platelet count borderline at 401,000.  B-MET essentially unremarkable.  The blood group is O positive.   IMPRESSION:  1. Acute on chronic gastrointestinal bleed.  2. Anemia of blood loss.  3. Hypertension.  4. Hyperlipidemia.   PLAN:  To admit the patient, to give IV fluids and IV transfusion of 2  units, check stool for occult blood, and continue home medications  except for hydrochlorothiazide and K-Dur.      Ricki Rodriguez, M.D.  Electronically Signed     ASK/MEDQ  D:  12/23/2008  T:  12/24/2008  Job:  [id number]

## 2011-04-30 NOTE — Assessment & Plan Note (Signed)
OFFICE VISIT   Janet [name], Janet [name]  DOB:  1934-03-07                                        July 17, 2009  CHART #:  [id number]   The patient returns to the office today for further followup, status  post right miniature thoracotomy for mitral valve repair and closure of  patent foramen ovale on July 05, 2009.  She was last seen here in the  office last week on July 12, 2009.  Since then, she is feeling much  better.  She states that her cough has essentially resolved.  She has no  shortness of breath.  Her appetite is improving.  She is delighted that  she is feeling much better and feels like she has turned things around  considerably.   PHYSICAL EXAMINATION:  GENERAL:  [name] well-appearing female.  VITAL SIGNS:  Blood pressure 116/77, pulse is 84 and regular, and oxygen  saturation 98% on room air.  SKIN:  Her surgical incisions are healing nicely.  LUNGS:  Auscultation of the chest demonstrates clear breath sounds  bilaterally.  CARDIOVASCULAR:  Regular rate and rhythm.  No murmurs, rubs, or gallops  are noted.  ABDOMEN:  Soft and nontender.  EXTREMITIES:  Warm and well perfused.  There is no lower extremity  edema.   DIAGNOSTIC TEST:  Lab work obtained in the office last week was notable  only for mild iron-deficient anemia with hemoglobin 11.2, hematocrit  34.6%.  Her white blood count was normal at 8400 and platelet count  225,000.  Her prothrombin time was 21.6 seconds with INR of 1.8.  Potassium 3.8, BUN 16, creatinine 0.96, and BNP level was 260.   Chest x-ray performed today is notable for clear lung fields bilaterally  with trivial right pleural effusion.  No other abnormalities are noted.   IMPRESSION:  The patient is doing much better and feels much better.   PLAN:  I have encouraged the patient to continue gradually increase her  activity as tolerated.  I have encouraged her really start walking more  during the day.  We have not made any  changes in her current  medications.  We will plan to see her back in 4 weeks with [name] repeat  chest x-ray.   Salvatore Decent. Cornelius Moras, M.D.  Electronically Signed   CHO/MEDQ  D:  07/17/2009  T:  07/18/2009  Job:  [id number]   cc:   Ricki Rodriguez, M.D.

## 2011-04-30 NOTE — Op Note (Signed)
[name], BERGMAN                  ACCOUNT NO.:  [id number]   MEDICAL RECORD NO.:  [id number]          PATIENT TYPE:  INP   LOCATION:  2307                         FACILITY:  [facility]   PHYSICIAN:  Salvatore Decent. Cornelius Moras, M.D. DATE OF BIRTH:  1934/05/05   DATE OF PROCEDURE:  07/05/2009  DATE OF DISCHARGE:                               OPERATIVE REPORT   PREOPERATIVE DIAGNOSIS:  Mitral valve prolapse with severe mitral  regurgitation.   POSTOPERATIVE DIAGNOSIS:  Mitral valve prolapse with severe mitral  regurgitation.   PROCEDURE:  Right miniature thoracotomy for mitral valve repair  (quadrangular resection of posterior leaflet with CorMatrix patch  augmentation of posterior leaflet and 30 mm Sorin Memo 3-D ring  annuloplasty) with closure of patent foramen ovale.   SURGEON:  Salvatore Decent. Cornelius Moras, MD   ASSISTANT:  Stephanie Acre Dasovich, PA   ANESTHESIA:  General.   BRIEF CLINICAL NOTE:  The patient is a 77 year old African American  female from [place] who was told she has had a heart murmur for many  many years.  Recently, she has been followed by Dr. Algie Coffer and over the  last 2 years she has developed progressive symptoms of exertional  shortness of breath, orthopnea, lower extremity edema, and intermittent  chest tightness and chest pressure.  Transesophageal echocardiogram  reveals mitral valve prolapse with a flail segment of the posterior leaf  of the mitral valve and severe (4+) mitral regurgitation.  There was  normal left ventricular systolic function.  Left and right heart  catheterization demonstrates the presence of normal coronary artery  anatomy with no significant coronary artery disease.  A full  consultation note has been dictated previously.  The patient and her  daughter have been counseled regarding the indications, risks, essential  benefits of surgery and they provided full informed consent for the  surgery as described.   OPERATIVE FINDINGS:  1. Flail segment of  portion of posterior leaflet (P2).  2. Diminutive P3 portion of posterior leaflet with foreshortened      chords and abnormal subvalvular apparatus consistent with      congenital abnormality of the mitral valve.  3. Severe (4+) mitral regurgitation.  4. Normal left ventricular systolic function.  5. Mild left ventricular hypertrophy.  6. Mild tricuspid regurgitation.  7. Severe left atrial enlargement.  8. Small patent foramen ovale.  9. Trivial residual mitral regurgitation following successful mitral      valve repair.   OPERATIVE NOTE IN DETAIL:  The patient was brought to the operating room  on the above-mentioned date and central monitoring was established by  the anesthesia team under the care and direction of Dr. Kipp Brood.  Specifically, a Swan-Ganz catheter was placed through the left internal  jugular approach.  However, the Swan-Ganz catheter could not be floated  into the pulmonary artery, it was left serving as a central venous  pressure monitor.  A radial arterial line was placed.  Intravenous  antibiotics were administered.  The patient was placed in the supine  position on the operating table.  General endotracheal anesthesia was  induced  uneventfully.  The patient was initially intubated with a dual-  lumen endotracheal tube.  A Foley catheter was placed.  Baseline  transesophageal echocardiogram was performed by Dr. Noreene Larsson.  This  confirmed the presence of wide-open severe mitral regurgitation.  There  was a broad jet of regurgitation that was directed eccentrically  anteriorly around the left atrium.  There was severe left atrial  enlargement.  The middle scallop (P2) of the posterior leaflet was flail  with numerous ruptured primary chords.  There was normal left  ventricular systolic function.  There was only mild tricuspid  regurgitation.  No other abnormalities were noted.   The patient was positioned with a soft roll behind the right scapula and  the neck  gently extended and turned towards the left.  The patient's  right neck, chest, abdomen, both groins, and both lower extremities were  prepared and draped in sterile manner.  A small incision was made in the  right inguinal crease.  The subcutaneous tissues were divided with  electrocautery.  The anterior surface of the right common femoral artery  and right common femoral vein were dissected.  The femoral vein was  located at the level of the greater saphenous bulb.  The femoral artery  was soft and normal in appearance.   Single lung ventilation was begun.  A right miniature anterolateral  thoracotomy incision was performed.  The right pleural space was entered  through the third intercostal space.  Exposure was felt to be excellent.  A soft tissue retractor was placed.  A longitudinal incision was made in  the pericardium 3 cm anterior to the phrenic nerve.  Silk traction  sutures were placed through the pericardium to facilitate exposure.   The patient was heparinized systemically.  A pursestring suture was  placed on the anterior surface of the greater saphenous bulb.  Two  concentric small pursestring sutures were placed on the anterior surface  of the right common femoral artery.  The femoral vein was cannulated  with a Seldinger technique through the greater saphenous bulb and a long  flexible guidewire was advanced up through the femoral vein, through the  inferior vena cava through the right atrium into the superior vena cava  using transesophageal echocardiogram for guidance.  The femoral vein was  dilated with serial dilators and a 22-French long femoral venous cannula  was advanced over the guidewire up through the inferior vena cava until  the tip of the cannula passed through the right atrium into the superior  vena cava, again using transesophageal echocardiogram for guidance.  The  femoral artery was cannulated with Seldinger technique and a flexible  guidewire was  advanced into the descending aorta.  The femoral artery  was dilated with serial dilators and a 16-French femoral arterial  cannula was advanced uneventfully into the iliac artery.  A small stab  incision was made low in the right neck.  The right internal jugular  vein was cannulated with Seldinger technique.  The internal jugular vein  was dilated with serial dilators and a 14-French pediatric femoral  venous cannula was advanced over the guidewire into the superior vena  cava.   Cardiopulmonary bypass was begun.  Vacuum assist venous drainage was  employed.  Venous drainage and exposure were excellent.  The pericardial  incision was extended in both directions to facilitate exposure.  The  undersurface of the aorta was gently dissected away from the anterior  surface of the right pulmonary artery.  Two concentric  pursestring  sutures were placed on the anterolateral surface of the aorta and an  antegrade cardioplegic cannula was placed through the pursestring  sutures.  A pursestring stitch was placed in the right atrium and a  retrograde cardioplegic cannula was placed through the right atrium into  coronary sinus using transesophageal echocardiogram for guidance.  The  patient was placed in gentle Trendelenburg position.  The patient was  cooled to 28 degrees systemic temperature.  The aortic cross-clamp was  applied and cold blood cardioplegia was delivered initially in antegrade  fashion through the aortic root.  The initial cardioplegic arrest was  rapid with early diastolic arrest.  Supplemental cardioplegia was  administered retrograde through the coronary sinus catheter.  Repeat  doses of cardioplegia were administered every 20 minutes throughout the  cross-clamp portion of the operation to maintain completely flat  electrocardiogram, both through the aortic root and retrograde through  the coronary sinus catheter.  Myocardial protection was felt to be  excellent.   A left  atriotomy incision was performed posteriorly through the intra-  atrial groove and extended partway across the back wall of the left  atrium after opening the oblique sinus.  A small patent foramen ovale  was discovered and subsequently repaired with a single figure of eight  suture.  The floor of the left atrium in the mitral valve was exposed  using retractor blade attached to a separate side arm placed through a  small stab incision just to the right side of the sternum through the  third intercostal space.  Exposure was felt to be excellent.  The mitral  valve was carefully examined.  A portion of the P2 segment of the  posterior leaflet was flail with numerous ruptured primary chordae  tendineae.  There was severe mitral regurgitation primarily related to  this flail segment.  There was diminutive P3 portion of the posterior  leaflet a very thin leaflet tissue and somewhat foreshortened  subvalvular apparatus.  The chordae tendineae to A3 and P3 were  foreshortened and fan-like, suggestive of congenitally abnormal valve.  The subvalvular apparatus to A1 and P1 are normal and the P1 segment of  posterior leaflet was not diminutive.  There were no anterior prolapse.   Interrupted 2-0 Ethibond horizontal mattress sutures were placed  circumferentially around the entire mitral annulus.  These sutures will  ultimately be utilized for ring annuloplasty, and at this juncture they  were suspended to suspend the valve symmetrically to facilitate repair.  A small quadrangular resection of the flail segment of P2 was performed.  The intervening vertical defect was closed with a single figure-of-eight  compression suture at the base followed by interrupted CV5 Gore-Tex  everting simple sutures to reapproximate the leaflet edges.  At this  juncture the valve was tested.  Due to the diminutive V3 and restricted  P3, there was inadequate posterior leaflet for coaptation in this region  and simple  ring annuloplasty with the quadrangular resection does not  appear to be satisfactory.  Leaflet augmentation of the posterior  leaflet was felt to be indicated.   The entire posterior leaflet was mobilized off the posterior annulus  from commissure to commissure with an 11 blade knife.  A patch of  CorMatrix bovine submucosal tissue patch was soaked in saline for 10  minutes.  The patch was then trimmed to an elliptical shape to utilize  to augment the posterior leaflet.  The patch was sewn initially to the  free margin of  the posterior leaflet anteriorly with a two-layer closure  of running CV5 Gore-Tex suture.  The patch was then reattached to the  posterior annulus also with a two-layer closure of running CV5 Gore-Tex  suture.  At this juncture the valve was again tested and appeared to be  competent even without ring annuloplasty completed.  The valve was sized  to accept a 30-mm annuloplasty ring.  This size was slightly larger than  the size of the surface area of the anterior leaflet.  The Sorin Memo 3-  D annuloplasty ring (catalog number [id number], serial number [id number]) was  secured in place uneventfully.  The valve was again tested with saline  at this juncture, and appeared to be perfectly competent.  There was a  broad surface area of coaptation of the anterior and posterior leaflet  with symmetrical coaptation.  There was no residual leak.  Rewarming was  begun.   A sump drain was placed across the mitral valve into the left ventricle  to serve as a left ventricular vent.  The left atriotomy incision was  closed with a two-layer closure of running 3-0 Prolene suture.  Epicardial pacing wires were fixed to the undersurface of the right  ventricular surface.  One final dose of warm hot shot cardioplegia was  administered.  The aortic cross-clamp was removed after a total cross-  clamp time of 162 minutes.   The heart was defibrillated and spontaneous rhythm resumed.   Epicardial  pacing wires were fixed to the right atrial appendage.  The lungs were  ventilated and heart allowed to fill after which time the left  ventricular vent was removed.  The patient was rewarmed to 37 degrees  centigrade temperature.  The lungs were ventilated and the heart allowed  to fill and transesophageal echocardiogram was used to briefly examine  the repair.  The repair appeared to be perfectly intact.  Full bypass  was continued and the antegrade cardioplegic cannula was removed.   The patient was weaned from cardiopulmonary bypass without difficulty.  The patient's rhythm at separation from bypass was AV sequential paced.  Total cardiopulmonary bypass time of the operation was 218 minutes.  The  patient was weaned from bypass on dopamine at 3 mcg/kg/minute.  Followup  transesophageal echocardiogram performed by Dr. Noreene Larsson after separation  from bypass demonstrated a well seated annuloplasty ring in the mitral  position.  The mitral valve was functioning normally and there was  trivial residual mitral regurgitation.  No other abnormalities were  noted.  Left ventricular function was preserved.   Protamine was administered to reverse the anticoagulation.  The femoral  venous cannula was removed and its cannulation pursestring suture was  secured.  The femoral arterial cannula was removed and pursestring  sutures were secured.  There was a palpable pulse in the distal right  femoral artery.  The right internal jugular cannula was removed and the  cannulation site controlled with everting pledgeted suture and direct  manual pressure.   Single lung ventilation was begun.  The left atriotomy closure and the  antegrade cardioplegic cannulation site were both inspected for  hemostasis.  The pericardial space was drained with a 28-French Bard  drain exited through the anterior port incision.  The pericardium was  loosely reapproximated with a patch of CorMatrix tissue matrix.   Right  chest was irrigated with saline solution and inspected for hemostasis.  The On-Q continuous pain management system was utilized to facilitate  postoperative pain control.  One  5-inch catheter was supplied with the  On-Q kit was tunneled initially through the subcutaneous tissues to the  posterior portal incision and then tunneled into the subpleural space to cover the second through the sixth intercostal space posteriorly.  The  catheter was flushed with 5 mL of 0.5% bupivacaine solution and then  ultimately connected to continuous infusion pump.  The right pleural  space was drained with a 28-French Bard drain.  The thoracotomy incision  was closed in multiple layers.  The skin incision was closed with  subcuticular skin closure.  The right groin incision was inspected for  hemostasis, closed in multiple layers and the skin incision closed with  a subcuticular skin closure.   The patient tolerated the procedure well.  Both chest tubes were fixed  to closed suction drainage devices.  The patient was reintubated with a  single-lumen endotracheal tube and subsequently transported to surgical  intensive care unit in stable condition.  There were no intraoperative  complications.  All sponge, instrument and needle counts were verified  correct at completion of the operation.      Salvatore Decent. Cornelius Moras, M.D.  Electronically Signed     CHO/MEDQ  D:  07/05/2009  T:  07/06/2009  Job:  [id number]   cc:   Ricki Rodriguez, M.D.

## 2011-04-30 NOTE — Discharge Summary (Signed)
NAMESHARISSA, [name]                  ACCOUNT NO.:  [id number]   MEDICAL RECORD NO.:  [id number]          PATIENT TYPE:  INP   LOCATION:  2016                         FACILITY:  [facility]   PHYSICIAN:  Salvatore Decent. Cornelius Moras, M.D. DATE OF BIRTH:  11-08-1934   DATE OF ADMISSION:  07/05/2009  DATE OF DISCHARGE:                               DISCHARGE SUMMARY   FINAL DIAGNOSIS:  Mitral valve prolapse with severe mitral  regurgitation.   IN-HOSPITAL DIAGNOSES:  1. Volume overload postoperatively.  2. Leukocytosis postoperatively.  3. Acute blood loss anemia postoperatively.   SECONDARY DIAGNOSES:  1. Hypertension.  2. Degenerative arthritis.  3. Positional vertigo.  4. Status post hysterectomy.  5. Status post laparoscopic cholecystectomy.   IN-HOSPITAL OPERATIONS AND PROCEDURES:  1. Right miniature thoracotomy for mitral valve repair with      quadrangular resection of posterior leaflet with core matrix patch      augmentation of posterior leaflet 30 mm Sorin MEMO 3D ring      annuloplasty with closure of patent foramen ovale.  2. Intraoperative transesophageal echocardiogram.   HISTORY AND PHYSICAL AND HOSPITAL COURSE:  The patient is a 74-year  [place] American female from [place], who told she had a heart murmur  for many many years.  Recently, she was seen by Dr. Algie Coffer and over the  last year, she has developed progressive symptoms of exertional  shortness of breath, orthopnea, lower extremity edema, and intermittent  chest tightness and chest pressure.  Transesophageal echocardiogram done  revealed mitral valve prolapse with a flail segment posterior leaflet  other than mitral valve and severe 4+ mitral regurgitation.  There is  normal left ventricular systolic function.  Cardiac catheterization done  demonstrated normal coronary artery anatomy with no significant coronary  artery disease.  The patient was referred to Dr. Cornelius Moras.  Dr. Cornelius Moras  discussed with the patient undergoing  miniature thoracotomy with mitral  valve repair.  He discussed risks and benefits with the patient.  The  patient acknowledged understanding and agreed to proceed.  Surgery was  scheduled for July 05, 2009.  For further details of the patient's past  medical history and physical exam, please see dictated H and P.   The patient was taken to the operating room on July 05, 2009, where she  underwent right miniature thoracotomy for mitral valve repair with  quadrangular resection of posterior leaflet with core matrix patch  augmentation of posterior leaflet and 30 mm Sorin MEMO 3D ring  annuloplasty with closure of patent foramen ovale.  The patient  tolerated this procedure and was transferred to the Intensive Care Unit  in stable condition.  Postoperatively, the patient noted to be  hemodynamically stable.  The patient was able to be extubated early  morning postop day #1.  Postextubation noted to be alert and oriented  x4.  Neuro intact.  Postop day #1 in the Intensive Care Unit, the  patient was AAI paced for sinus brady/junctional rhythm.  Blood pressure  was stable.  All drips were weaned and discontinued.  A chest x-ray  obtained was stable.  Her mediastinal tube was discontinued and pleural  tube was left in place.  The patient was up ambulating with cardiac  rehab.  She was felt to be stable for transfer out to PCTU on postop day  #1.  While in telemetry floor, repeat chest x-ray remained stable, small  bilateral effusions.  The patient's pleural tube drainage was monitored  and had decreased and was able to be discontinued postop day #3.  Repeat  chest x-ray remained stable.  During this time, the patient was  encouraged to use her incentive spirometer.  She was able to be weaned  off oxygen with O2 sats greater than 90% on room air.  The patient's  heart rate and blood pressure continued to be monitored.  She was  started on low-dose beta-blocker as well as an ACE inhibitor.   Blood  pressure tolerated well.  Her external pacer was turned off and she was  noted to be normal sinus rhythm in 80s.  External pacing wires were  discontinued without difficulty on postop day #3.  Postoperatively, the  patient was up ambulating well with assistance.  She was tolerating diet  well.  No nausea or vomiting noted.  All incisions were clean, dry,  intact, and healing well.  She was started on Coumadin and daily PT,  INRs were obtained.  They were increasing appropriately.  We will  continue Coumadin as directed.  The patient did develop leukocytosis  postoperatively.  Her white count increased to 16.9.  She was afebrile.  No signs of superficial infection.  She still had her Foley and at that  time, it was discontinued.  White count was monitored and was back down  to normal by postop day #4 at 10.4.  The patient did have some mild  volume overload postoperatively and was started on diuretics.  This was  improving prior to discharge home.  She also had some mild acute blood  loss anemia, but was stable.  She did not require any transfusions  postoperatively.   Postop day 4, July 09, 2009, the patient's vital signs noted to be  stable.  She is in normal sinus rhythm.  Her most recent lab work shows  INR of 1.5.  White blood cell count 10.4, hemoglobin of 11.3, hematocrit  33.9, and platelet count 146.  Sodium of 131, potassium 3.8, chloride of  94, bicarbonate 26, BUN of 18, creatinine of 1.24, and glucose of 97.  The patient is tentatively ready for discharge to home in the a.m.  pending she remained stable.   FOLLOWUP APPOINTMENTS:  Follow up appointment has been arranged with Dr.  Cornelius Moras for July 17, 2009, at 9:15 a.m.  The patient will need to obtain  PA and lateral chest x-ray 30 minutes prior to this appointment.  She  has a suture removal appointment for July 13, 2009, at 9 a.m. with the  nurse.  She is to follow up with Dr. Algie Coffer in 2 weeks.  She will need  to  contact the office to make these arrangements.  Home Health PT as  well as home health nurse has been arranged.  Home health nurse will  draw PT/INR level on July 28, and fax the results to Dr. Roseanne Kaufman  office.   ACTIVITY:  The patient is instructed no driving until released to do so,  no lifting over 10 pounds.  She is told to ambulate 3-4 times per day,  progress as tolerated, and continue her breathing exercises.  INCISIONAL CARE:  The patient is told to shower, washing her incisions  using soap and water.  She is to contact the office if she develops any  drainage or opening from any of her incision sites.   DIET:  The patient educated on diet to be low-fat and low-salt.   DISCHARGE MEDICATIONS:  1. Meclizine 12.5 mg daily.  2. Omeprazole 20 mg daily.  3. Simvastatin 40 mg daily.  4. Calcium 600 mg daily with vitamin D.  5. Coumadin 2.5 mg at night.  6. Enteric-coated aspirin 81 mg daily.  7. Lopressor 25 mg b.i.d.  8. Lisinopril 10 mg daily.  9. Lasix 40 mg daily x5 days.  10.Potassium chloride 20 mEq daily x5 days.  11.Ultram 50 mg 1-2 tablets q.4-6 h. p.r.n. pain.      Sol Blazing, PA      Salvatore Decent. Cornelius Moras, M.D.  Electronically Signed    KMD/MEDQ  D:  07/09/2009  T:  07/10/2009  Job:  [id number]   cc:   Ricki Rodriguez, M.D.

## 2011-04-30 NOTE — Consult Note (Signed)
[name], [name]                  ACCOUNT NO.:  [id number]   MEDICAL RECORD NO.:  [id number]          PATIENT TYPE:  INP   LOCATION:  3705                         FACILITY:  [facility]   PHYSICIAN:  John C. Madilyn Fireman, M.D.    DATE OF BIRTH:  Jan 02, 1934   DATE OF CONSULTATION:  12/24/2008  DATE OF DISCHARGE:                                 CONSULTATION   REASON FOR CONSULTATION:  Presumed GI bleeding.   HISTORY OF PRESENT ILLNESS:  The patient is a 77 year old black female  who has had a confirmed and suspected GI bleeds in the past who  presented with exertional dyspnea of several weeks' duration.  She was  found to have a hemoglobin of 6.5.  On questioning, she states she  noticed some dark stools a week or two ago that stopped spontaneously  and she took no particular note of.  She has been off Coumadin at least  since March when she presented with heme-positive stools and hemoglobin  of 4.9.  An EGD at that time revealed a small hiatal hernia and was  otherwise unremarkable.  She also had a documented gastric ulcer in  2005, had a followup EGD with colonoscopy by Dr. Ewing Schlein that showed  confirmed healing of the ulcer and showed a small ascending colon polyp,  which was negative for adenomatous changes.   PAST MEDICAL HISTORY:  1. Hypertension.  2. Dyslipidemia.  3. History of heart murmur.   PAST SURGICAL HISTORY:  Cholecystectomy.   MEDICATIONS:  Diltiazem, meclizine, metoprolol, ferrous sulfate, Zocor,  hydrochlorothiazide, K-Dur, Prilosec.   PHYSICAL EXAMINATION:  GENERAL:  Alert and oriented, in no acute  distress.  HEART:  Regular rate and rhythm without murmur.  ABDOMEN:  Soft, nondistended with normoactive bowel sounds.  No  hepatosplenomegaly, mass, or guarding.   Hemoglobin 6.4, hematocrit 20.7.  BMET unremarkable.  MCV 68.6,  hemoglobin after 2 units of blood transfusion 8.8, platelet counts  normal.   IMPRESSION:  Presumed recurrent gastrointestinal bleed, await  pending  Hemoccults with etiology of presumed bleeding not clear at the moment,  currently off anticoagulation.   PLAN:  We will wait Hemoccults and consider whether or not to pursue  repeat EGD with or without colonoscopy or possibly capsule endoscopy.           ______________________________  Everardo All. Madilyn Fireman, M.D.     JCH/MEDQ  D:  12/24/2008  T:  12/24/2008  Job:  [id number]   cc:   Ricki Rodriguez, M.D.

## 2011-04-30 NOTE — Assessment & Plan Note (Signed)
OFFICE VISIT   [name], [name] A  DOB:  1934-09-02                                        August 14, 2009  CHART #:  [id number]   HISTORY:  The patient returns to the office today for further followup  status post right miniature thoracotomy for mitral valve repair and  closure of patent foramen ovale on July 05, 2009.  She was last seen  here in the office on July 17, 2009.  Since then, she did go to the  emergency room on one occasion approximately 2 weeks ago for an episode  of dehydration.  She was observed briefly in emergency room and  responded dehydration.  Diuretics were discontinued.  Since then, she  has been seen further in followup by Dr. Algie Coffer.  She has now been  taken off Coumadin.  She has been put back on diltiazem XR and  Lopressor.  ACE inhibitor has been discontinued.  She reports that she  is doing quite well.  She denies any fevers or chills.  She has not had  any shortness of breath.  Her activity level has continued to increase,  although she still stays at home most of the time so that she can care  for her elderly mother.  She has been out shopping and back to  essentially normal activities at this point.  She has not had any dizzy  spells.  She has minimal soreness remaining in her right chest.  She is  not taking any pain medicine.  She has no other complaints.   PHYSICAL EXAMINATION:  General:  Well-appearing female.  Vital Signs:  Blood pressure 132/73, pulse is 44 and regular, oxygen saturation 95% on  room air.  Chest:  Examination of the chest demonstrates clear lung  fields which are symmetrical bilaterally.  No wheezes or rhonchi are  noted.  The mini thoracotomy incision has healed nicely.  Cardiovascular:  Bradycardic heart rhythm.  No murmurs or rubs are  noted.  There are prominent S2 gallop.  Abdomen:  Soft, nontender.  Extremities:  Warm and well perfused.  There is no lower extremity  edema.   DIAGNOSTIC TESTS:   Chest x-ray performed today at the [facility] is reviewed.  This demonstrates clear lung fields  bilaterally.  There are no pleural effusions.  The cardiac silhouette is  stable.  No other abnormalities are noted.   IMPRESSION:  Satisfactory progress following right miniature thoracotomy  for mitral valve repair.  The patient looks quite good, although her  heart rate is regular but bradycardic here in the office today.  She is  asymptomatic.   PLAN:  We will contact Dr. Roseanne Kaufman office to see if they could have  the patient come in for routine electrocardiogram to check her heart  rhythm sometime later this week.  Now that she is on both Lopressor and  a calcium channel blocker, she may be exhibiting sinus node dysfunction  or heart block.  She otherwise seems to be doing quite well.  At some  point, a followup echocardiogram would be useful for routine followup  after her recent mitral valve repair.  We will plan to see her back in 6  months.  At this point, she has no particular physical restrictions with  respect to her recent surgery.  All of  her questions have been  addressed.   Salvatore Decent. Cornelius Moras, M.D.  Electronically Signed   CHO/MEDQ  D:  08/14/2009  T:  08/15/2009  Job:  [id number]   cc:   Ricki Rodriguez, M.D.

## 2011-04-30 NOTE — Op Note (Signed)
[name], [name]                  ACCOUNT NO.:  [id number]   MEDICAL RECORD NO.:  [id number]          PATIENT TYPE:  INP   LOCATION:  2307                         FACILITY:  [facility]   PHYSICIAN:  Guadalupe Maple, M.D.  DATE OF BIRTH:  23-Feb-1934   DATE OF PROCEDURE:  07/05/2009  DATE OF DISCHARGE:                               OPERATIVE REPORT   PROCEDURE:  Intraoperative transesophageal echocardiography.   [name] is a 77 year old African American female with a history of  severe mitral regurgitation, scheduled to undergo minimally invasive  repair of mitral valve by Dr. Cornelius Moras.  Intraoperative transesophageal  echocardiography was requested to evaluate the mitral valve and  determine if any other valvular pathology was present and to serve as a  monitor of right and left ventricular status.   The patient was brought to the operating room of [facility] and  general anesthesia was induced without difficulty.  The trachea was  intubated without difficulty.  The following orogastric suctioning, the  transesophageal echocardiography probe was inserted into the esophagus  without difficulty.   IMPRESSION:  Prebypass findings:  1. Aortic valve.  The aortic valve was trileaflet and opened normally.      There was no aortic insufficiency that could be seen.  There was      some filamentous strands attached to the leaflets of the aortic      valve consistent with Lambl excrescences.  There were no signs of      vegetations on the valve.  2. Mitral valve.  There was severe mitral regurgitation.  The area of      the P2 and P3 regions of the posterior leaflet showed flail      segments with an anteriorly directed jet of mitral regurgitation,      which tracked along the superior surface of the anterior leaflet.      There was an area in the P2, A2 region where there was no      coaptation and the remainder of the P3 and P2 areas showed a      severely flail segment.  3. Left  ventricle.  The left ventricular cavity was dilated.  Left      ventricular wall thickness measured 1.2-1.3 cm. which was      consistent with mild left ventricular hypertrophy.  There was good      contractility in all segments interrogated.  Ejection fraction was      estimated at 60-65%.  There was no thrombus noted in the left      ventricular apex.  4. Right ventricle.  The right ventricular size appeared normal with      good contractility of the right ventricular free wall.  5. Tricuspid valve.  The tricuspid valve appeared structurally normal.      There were no vegetations or flail segments and there was 1+      tricuspid insufficiency.  6. Interatrial septum.  The interatrial septum was intact without      evidence of patent foramen ovale or atrial septal defect.  7. Left  atrium.  Left atrial cavity was severely enlarged and measured      6.3 cm in the anterior-posterior dimension.  There was no thrombus      noted in the left atrial cavity or left atrial appendage.  8. Ascending aorta.  There was mild atheromatous disease at the      ascending aorta with no mobile plaques with excessive thickening      noted.  9. Descending aorta.  The descending aorta showed mild atheromatous      disease and measured 2.3 cm in diameter.   Postbypass findings:  1. Aortic valve.  The aortic valve was unchanged from the prebypass      study.  There was again Lambl excrescences noted on the valve      leaflets, but no aortic insufficiency and normal opening of the      valve .  2. Mitral valve.  There was an annuloplasty ring in the mitral      position.  The valve appeared to open well.  There was trace mitral      insufficiency in the region of the base of the anterior leaflet.      This appeared distance just a tiny flame jet consistent with very      mild regurgitation.  There was no regurgitation along the      coaptation line.  Continuous wave Doppler interrogation of the      mitral  inflow revealed a transmitral gradient of 3-4 mmHg and a      mitral valve area of 2.0 cm2 by pressure half-time.  3. Left ventricle.  The left ventricular cavity was reduced in size.      There was some dyssynchrony of the contractile pattern due to      ventricular pacing.  Ejection fraction was estimated at 55-60%.  4. Right ventricle.  The right ventricular function appeared      satisfactory with adequate contractility of the right ventricular      free wall.  5. Tricuspid valve.  The tricuspid valve was unchanged from the      prebypass study.  There was 1+ tricuspid insufficiency.           ______________________________  Guadalupe Maple, M.D.     DCJ/MEDQ  D:  07/05/2009  T:  07/06/2009  Job:  [id number]

## 2011-04-30 NOTE — Assessment & Plan Note (Signed)
OFFICE VISIT   [name], [name]  DOB:  12/02/1934                                        July 12, 2009  CHART #:  [id number]   Ms. Andringa returns to the office today for follow up, status post right  miniature thoracotomy for mitral valve repair and closure of patent  foramen ovale on July 05, 2009.  Her early postoperative recovery was  uneventful and she was discharged from the hospital 2 days ago.  She  returns to the office today for wound check and suture removal.  She  does report that she has had some slight increased shortness of breath  and nonproductive cough over the last couple of days.  She has mild  soreness in her chest and she is infrequently using any pain medicine.  Her appetite has not improved much but she is eating.  She has not had  any dizzy spells.  Medications remain unchanged from the time of  hospital discharge.  She has not yet had her prothrombin time checked.   PHYSICAL EXAMINATION:  GENERAL:  Notable for [name] well-appearing female  with pulse 108 and regular.  Two-channel telemetry rhythm strip  demonstrates what appears to be sinus tachycardia versus controlled rate  atrial flutter.  Blood pressure 93/71, oxygen saturations 99% on room  air.  LUNGS:  Auscultation of the chest reveals [name] few bibasilar inspiratory  crackles and diminished breath sounds at both lung bases.  No wheezes or  rhonchi noted.  Her surgical incisions are healing nicely.  Chest tube  sutures are removed.  There is some serous drainage from her posterior  pleural tube exit site.  EXTREMITIES:  Warm and well perfused.  There is no lower extremity  edema.  The remainder of her physical exam is unremarkable.   IMPRESSION:  Overall, Ms. Starkel looks good although, her heart rate is  up.  Rhythm is regular and is either sinus tachycardia or [name] controlled  rate atrial flutter.  Her blood pressure is not elevated.  She still has  volume overload on exam.   PLAN:  I  will go ahead and start Ms. Ostergaard on amiodarone empirically.  We  will check her prothrombin time today and her Coumadin dose may need to  be adjusted particularly given the initiation of amiodarone therapy.  I  have asked her to increase her Lasix to 40 mg twice daily.  She will  increase potassium with this.  We will check complete blood count and  basic metabolic panel and B-natriuretic peptide level today as well.  We  will plan to see her back in the office on Monday for further followup.  Her first follow up with Dr. Algie Coffer is scheduled for August 10.   Salvatore Decent. Cornelius Moras, M.D.  Electronically Signed   CHO/MEDQ  D:  07/12/2009  T:  07/12/2009  Job:  [id number]

## 2011-04-30 NOTE — Op Note (Signed)
[name], [name]                  ACCOUNT NO.:  [id number]   MEDICAL RECORD NO.:  [id number]          PATIENT TYPE:  INP   LOCATION:  6526                         FACILITY:  [facility]   PHYSICIAN:  John C. Madilyn Fireman, M.D.    DATE OF BIRTH:  [birth year]   DATE OF PROCEDURE:  02/22/2008  DATE OF DISCHARGE:                               OPERATIVE REPORT   PROCEDURE:  Esophagogastroduodenoscopy:   ENDOSCOPIST:  Everardo All. Madilyn Fireman, M.D.   INDICATIONS FOR PROCEDURE:  Gastrointestinal bleeding manifested as  melena.   PROCEDURE:  The patient was placed in the left lateral decubitus  position and placed on the pulse monitor with continuous low-flow oxygen  delivered by nasal cannula.  She was sedated with 40 mcg IV fentanyl and  4 mg IV Versed.  The Olympus video endoscope was advanced under direct  vision into the oropharynx and esophagus.  The esophagus was straight  and of normal caliber with the squamocolumnar line at 38 cm.  There was  a 2-cm hiatal hernia with no ring, stricture or other abnormalities and  no bleeding sources at the GE junction or esophagus.  The stomach was  entered and a small amount of liquid secretions were suctioned from the  fundus.  A retroflexed view of the cardia confirmed a hiatal hernia and  was otherwise unremarkable.  The fundus, body and antrum appeared  normal.  The duodenum was entered and both the bulb and second portion  were inspected and appeared to be within normal limits.  Scope was then  withdrawn and the patient returned to the recovery room and stable  condition.  She tolerated the procedure well and there were no immediate  complications.   IMPRESSION:  Hiatal hernia, otherwise normal study.   PLAN:  Consider colonoscopy as in or outpatient, and will review  indication and ongoing need for Coumadin.           ______________________________  Everardo All. Madilyn Fireman, M.D.     JCH/MEDQ  D:  02/25/2008  T:  02/26/2008  Job:  [id number]   cc:   Ricki Rodriguez, M.D.

## 2011-04-30 NOTE — Assessment & Plan Note (Signed)
OFFICE VISIT   [name]  DOB:  December 29, 1933                                        July 03, 2009  CHART #:  [id number]   The patient returns to the office today for further followup with  tentative plans to proceed with surgery on Wednesday July 21.  She was  originally seen in consultation on June 25, and [name] full consultation  report, history and physical exam was dictated at that time.  Since  then, the patient underwent CT angiogram of the thoracic and abdominal  aorta on July 16.  This revealed moderate atherosclerosis of the aorta  but no significant flow-limiting lesions or findings that might preclude  femoral artery cannulation.  She returns to the office today with one of  her daughters to discuss matters further.  She is eager to proceed with  surgery as soon as practical.  She still has some significant exertional  shortness of breath, but she clinically remains stable.  We again  reviewed the indications, risks, and potential benefits of surgery.  We  will plan mitral valve repair using minimally invasive approach.  She  understands and we will reevaluate the significance of her tricuspid  regurgitation, and consider tricuspid valve repair if necessary.  She  understands there is [name] very small chance that her valve cannot be  repaired in which case we would replace her valve using [name] bioprosthetic  tissue valve.  She understands and accepts all potential associated  risks of surgery including but not limited to risk of death, stroke,  myocardial infarction, congestive heart failure, respiratory failure,  pneumonia, bleeding requiring blood transfusion, heart block with  bradycardia requiring permanent pacemaker, late complications related to  valve repair, possible need to convert to full open sternotomy or larger  thoracotomy.  All of her questions have been addressed.   Salvatore Decent. Cornelius Moras, M.D.  Electronically Signed   CHO/MEDQ  D:  07/03/2009   T:  07/04/2009  Job:  [id number]   cc:   Ricki Rodriguez, M.D.

## 2011-04-30 NOTE — H&P (Signed)
HISTORY AND PHYSICAL EXAMINATION   June 09, 2009   Re:  [name], [name]            DOB:  1934-08-27   DATE OF [facility] ADMISSION AND SURGERY:  July 05, 2009.   PRESENTING CHIEF COMPLAINT:  Shortness of breath.   HISTORY OF PRESENT ILLNESS:  The patient is [name] 77 year old [place]  American female from [place], who states that she has had [name] long  history of heart murmur with mitral regurgitation for which she has been  followed by Dr. Orpah Cobb.  The patient has history of hypertension  and hyperlipidemia.  She states that she was hospitalized with  congestive heart failure nearly 24 years ago on one occasion.  She has  otherwise done well and been physically active and fairly healthy most  of her life.  She states that over the last 2 years, she has had  significant progression of exertional shortness of breath, orthopnea,  lower extremity edema, and intermittent chest tightness and chest  pressure.  These symptoms have progressed dramatically in recent months  to the point where now she gets short of breath with very mild physical  activity.  She has been followed carefully by Dr. Algie Coffer, and recently  she underwent transesophageal echocardiogram on Apr 26, 2009.  She was  found to have mitral valve prolapse with [name] flail segment of the  posterior leaflet with ruptured chordae tendineae and severe (4+) mitral  regurgitation.  There is left atrial enlargement.  There is normal left  ventricular size and systolic function.  There was mild-to-moderate  tricuspid regurgitation.  She subsequently underwent left and right  heart catheterization by Dr. Algie Coffer.  This revealed normal coronary  artery anatomy with no significant coronary artery disease.  The patient  has been referred for surgical intervention.   REVIEW OF SYSTEMS:  General:  The patient reports normal appetite.  She  has not been gaining nor losing weight recently.  She is 5 feet tall and  weighs approximately 147 pounds.  Cardiac:  Notable for progressive  symptoms of congestive heart failure, now functionally class III.  She  has intermittent episodes of tightness across her chest that seem to  come and go sporadically.  She has severe exertional shortness of breath  with orthopnea and bilateral lower extremity edema.  She has  intermittent palpitations.  She has intermittent mild dizziness.  She  has never had any syncopal episodes.  Respiratory:  Notable for  shortness of breath.  The patient denies productive cough, hemoptysis,  wheezing.  Gastrointestinal:  Notable for two previous episodes of upper  GI bleeding within the last 2 years.  Most recently she was hospitalized  in January 2010 with melanotic stool and severe iron deficient anemia.  She was transfused packed cells and underwent upper and lower endoscopy.  No active source of bleeding was identified.  She did have excision of [name]  benign colonic polyp.  She has had no blood in her stools since then.  Genitourinary:  Negative.  Peripheral Vascular:  Negative.  Musculoskeletal:  Notable for chronic pain in both knees.  This limits  her walking somewhat and she walks using [name] cane.  Neurologic:  Negative.  The patient denies transient monocular blindness or transient numbness  or weakness.  HEENT:  Negative.  The patient is edentulous with full set  upper and lower dentures.   PAST MEDICAL HISTORY:  1. Mitral regurgitation.  2. Hypertension.  3. Obesity.  4. Degenerative arthritis.  5. Positional vertigo.   PAST SURGICAL HISTORY:  1. Hysterectomy.  2. Laparoscopic cholecystectomy.   FAMILY HISTORY:  Notable for the absence of any family members with  valvular heart disease.   SOCIAL HISTORY:  The patient is widowed and lives with her 37 year old  mother here in [place].  She has fairly large family with numerous  children and grandchildren who are supportive.  She is [name] nonsmoker.  She  denies alcohol  consumption.  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   CURRENT MEDICATIONS:  1. Diltiazem ER 240 mg daily.  2. Meclizine 12.5 mg daily.  3. Metoprolol 50 mg daily.  4. Iron sulfate 325 mg daily.  5. Zocor 40 mg daily.  6. Hydrochlorothiazide 12.5 mg daily.  7. Potassium chloride 10 mEq twice in the morning to once in the      evening.  8. Prilosec 20 mg daily.  9. Tylenol as needed.  10.Nitroglycerin as needed.  11.Multivitamin daily.  12.Vitamin D and calcium supplement.  13.Colace 100 mg daily.   DRUG ALLERGIES:  Aspirin has been implicated with gastric ulcerations  and GI bleeding.  Coumadin has also been implicated as [name] possible source  of bleeding in the past, although the patient has never had any  documented drug allergies per se.   PHYSICAL EXAMINATION:  GENERAL:  The patient is [name] well-appearing, mildly  obese [place] American female.  VITAL SIGNS:  Blood pressure is 119/75, pulse 64 and regular, and oxygen  saturation 97% on room air.  HEENT:  Unrevealing.  NECK:  Supple.  There is no jugular venous distention.  LUNGS:  Auscultation of the chest reveals breath sounds which are fairly  clear, slightly diminished at the left lung base.  No wheezes or rhonchi  are noted.  CARDIOVASCULAR:  Regular rate and rhythm.  There is [name] loud grade 4/6  holosystolic murmur heard best at the apex, but with radiation all  across the entire precordium into the axilla and back.  No diastolic  murmurs are noted.  ABDOMEN:  Moderately obese, but soft and nontender.  The liver edge is  not palpable.  Bowel sounds are present.  EXTREMITIES:  Warm and well perfused.  There is mild-to-moderate  bilateral lower extremity edema.  Pulses are diminished at the ankle.  Femoral pulses are palpable.  RECTAL/GU:  Both deferred.  NEUROLOGIC:  Grossly nonfocal and symmetrical throughout.   DIAGNOSTIC TEST:  Transesophageal  echocardiogram performed by Dr.  Algie Coffer on Apr 26, 2009, is reviewed.  This demonstrates mitral valve  prolapse with flail middle scallop of the posterior leaflet of the  mitral valve (P2) with numerous ruptured primary cords.  There is wide  open severe mitral regurgitation.  There is normal left ventricular  systolic function.  There is mild left ventricular hypertrophy.  There  may be trace aortic insufficiency.  The aortic valve otherwise appears  normal.  The left atrium is somewhat enlarged.  There is moderate  tricuspid regurgitation at least.  No other abnormalities are noted.  Cardiac catheterization is also reviewed.  This demonstrates codominant  coronary circulation with normal coronary artery anatomy.  No  significant coronary artery disease.  Left ventricular systolic function  appears preserved.  There is obvious severe mitral regurgitation.   IMPRESSION:  Mitral valve prolapse with flail middle scallop of the  posterior leaflet and severe (4+) mitral regurgitation.  The  patient had  slow progression of symptoms of congestive heart failure, now  functionally class III.  She has normal coronary artery anatomy with no  significant coronary artery disease, and left ventricular systolic  function remains preserved.   PLAN:  I have discussed options at length with the patient and her  daughter here in the office today.  Alternative treatment strategies  have been discussed.  The risks and benefits associated with surgical  intervention have been reviewed in detail.  I believe there is [name] high  likelihood that her valve could be repaired.  If it cannot be repaired,  we would replace it using [name] bioprosthetic tissue valve.  I suspect that  she will be [name] fairly good candidate for use of minimally invasive  approach for her surgery.  She will need CT angiogram of the thoracic  and abdominal aorta to evaluate the significance of her underlying  peripheral vascular disease with  diminished pulses on physical exam.  All of her questions have been addressed.  We will tentatively plan to  proceed with surgery on July 05, 2009.  The patient will return to our  office for final followup on July 03, 2009.  All of her questions have  been addressed.   Salvatore Decent. Cornelius Moras, M.D.  Electronically Signed   CHO/MEDQ  D:  06/09/2009  T:  06/10/2009  Job:  [id number]   cc:   Ricki Rodriguez, M.D.

## 2011-04-30 NOTE — Assessment & Plan Note (Signed)
OFFICE VISIT   Janet [name], Janet [name]  DOB:  [birth year]                                        March 05, 2010  CHART #:  [id number]   HISTORY OF PRESENT ILLNESS:  The patient returns for routine followup  status post right miniature thoracotomy for mitral valve repair on July 05, 2009.  She was last seen here in the office on August 14, 2009.  She  was originally scheduled for [name] 72-month followup, but she cancelled [name]  couple of office visits and rescheduled.  She reports that overall she  is doing very well.  She states that her exercise tolerance is pretty  good and she notes that in particular she gets short of breath less with  activities than now in comparison to prior to her surgery last summer.  She still gets some swelling in her lower legs, but this also seems to  be somewhat decreased.  She no longer has any pain in her chest.  Her  appetite is good.  She is getting around well and has no complaints.  She continues to follow up with Dr. Algie [name] who attends to her long-term  medical needs.  She thinks that he did an outpatient echocardiogram for  followup sometime [name] few months ago, but she is not certain and we have  not yet seen an echo report.  She is no longer taking Coumadin.  She has  not had any new medical problems or difficulties.  The remainder of her  review of systems is unremarkable.   CURRENT MEDICATIONS:  1. Diltiazem XR 240 mg daily.  2. Zocor 40 mg daily.  3. Multivitamin 1 tablet daily.  4. Omeprazole 20 mg daily.  5. Lopressor 25 mg twice daily.  6. Aspirin 1 tablet daily.  7. Coenzyme Q10 one tablet daily.   PHYSICAL EXAMINATION:  Notable for well-appearing elderly female with  blood pressure 154/90, pulse 81, oxygen saturation 98% on room air.  Examination of the chest reveals clear breath sounds which are  symmetrical bilaterally.  No wheezes, rales, or rhonchi are noted.  Cardiovascular exam includes regular rate and rhythm.  No  murmurs, rubs,  or gallops are appreciated.  The right miniature thoracotomy incision  has healed completely.  The abdomen is soft, nontender.  The extremities  are warm and well perfused.  There is mild bilateral lower extremity  edema.   IMPRESSION:  The patient is doing very well and getting along fine from  [name] functional standpoint.  She has no ongoing symptoms or signs of  significant congestive heart failure and her exercise tolerance has  improved.  She thinks she has had [name] followup echocardiogram in recent  months, though we have not yet seen an echo report.   PLAN:  In the future, the patient will call and return to see [place] as  needed.  All of her questions have been addressed.  We will contact Dr.  Roseanne [name] office to obtain records from her recent followup  echocardiogram.   Janet [name], M.D.  Electronically Signed   CHO/MEDQ  D:  03/05/2010  T:  03/06/2010  Job:  [id number]   cc:   Janet [name], M.D.

## 2011-04-30 NOTE — Op Note (Signed)
[name], [name]                  ACCOUNT NO.:  [id number]   MEDICAL RECORD NO.:  [id number]          PATIENT TYPE:  INP   LOCATION:  3705                         FACILITY:  [facility]   PHYSICIAN:  John C. Madilyn Fireman, M.D.    DATE OF BIRTH:  [birth year]   DATE OF PROCEDURE:  12/26/2008  DATE OF DISCHARGE:  12/26/2008                               OPERATIVE REPORT   OPERATION:  Esophagogastroduodenoscopy.   INDICATIONS FOR PROCEDURE:  Recurrent drop in hemoglobin with GI workup  unrevealing in the past but no colonoscopy in the last 4-1/2 years.  She  is actually heme negative on current admission but has been heme  positive in the past when source of her bleeding has been elusive.  She  has been on Coumadin in the past but now is off of it.   PROCEDURE:  The patient was placed in the left lateral decubitus  position and placed on the pulse monitor with continuous low-flow oxygen  delivered by nasal cannula.  She was sedated with 50 mcg IV fentanyl and  5 mg IV Versed.  The Olympus video endoscope was advanced under direct  vision into the oropharynx and the esophagus.  The esophagus was  straight and of normal caliber at the squamocolumnar line at 38 cm.  There was no visible hiatal hernia, ring, stricture or other abnormality  at the GE junction.  The stomach was entered and a small amount of  liquid secretions were suctioned from the fundus.  Retroflexed view of  the cardia was unremarkable.  The fundus, body, antrum, and pylorus all  appeared normal.  The duodenum was entered.  Both the bulb and second  portion were well inspected and appeared to be within normal limits.  The scope was then withdrawn and the patient prepared for colonoscopy.  She tolerated the procedure well.  There were no immediate  complications.   IMPRESSION:  Normal endoscopy.   PLAN:  We will proceed with colonoscopy.           ______________________________  Everardo All. Madilyn Fireman, M.D.     JCH/MEDQ  D:   12/26/2008  T:  12/26/2008  Job:  [id number]   cc:   Ricki Rodriguez, M.D.

## 2011-05-03 NOTE — Consult Note (Signed)
[name], [name]                  ACCOUNT NO.:  [id number]   MEDICAL RECORD NO.:  [id number]          PATIENT TYPE:  INP   LOCATION:  3713                         FACILITY:  [facility]   PHYSICIAN:  Petra Kuba, M.D.    DATE OF BIRTH:  [birth year]   DATE OF CONSULTATION:  11/21/2004  DATE OF DISCHARGE:                                   CONSULTATION   HISTORY:  The patient is seen at the request of Dr. Algie Coffer for microcytic  anemia.  Rectal exam was done on admission, the guaiac was reported to me  that it was positive, although I do not note documentation in the chart.  She has not had any previous GI complaints or problems, only occasionally  had some constipation and will take some Senokot for that.  She has no upper  tract symptoms.  She has been on both aspirin and Voltaren but has not  noticed any blood in her bowels but says she does not look.   PAST MEDICAL HISTORY:  Pertinent for hypertension, heart murmur, as well as  history of cholecystectomy.   ALLERGIES:  Does not smoke or drink.   MEDICATIONS:  Cardizem, K-Dur, HCTZ, Lipitor, Hyoscyamine, nitroglycerin,  Diclofenac, Imdur, and an aspirin everyday.   FAMILY HISTORY:  Pertinent for mother with polyps and hiatal hernia.  No  colon cancer in the family.   REVIEW OF SYMPTOMS:  Negative except as above.  She was short of breath from  her anemia.   PHYSICAL EXAMINATION:  GENERAL:  No acute distress lying comfortably in the bed.  VITAL SIGNS:  Stable, afebrile.  ABDOMEN:  Soft, nontender.   LABORATORY DATA:  Pertinent for microcytic anemia, normal BUN and  creatinine.  PT 15.  Liver tests very slightly elevated, otherwise, OK.   ASSESSMENT:  Microcytic anemia in a patient on aspirin and nonsteroidals.   PLAN:  The risks, benefits, and methods of endoscopy and colonoscopy were  discussed with the patient and her daughter.  We will proceed with an  endoscopy today and decide further workup and plans pending the above.   In  the meantime, agree with holding nonsteroidals.  Will probably add pump  inhibitors after her endoscopy.       MEM/MEDQ  D:  11/21/2004  T:  11/21/2004  Job:  [id number]   cc:   Ricki Rodriguez, M.D.  [address]  [place] [address]  Fax: [phone]

## 2011-05-03 NOTE — Op Note (Signed)
[name], [name]                  ACCOUNT NO.:  [id number]   MEDICAL RECORD NO.:  [id number]          PATIENT TYPE:  AMB   LOCATION:  ENDO                         FACILITY:  [facility]   PHYSICIAN:  Petra Kuba, M.D.    DATE OF BIRTH:  Nov 08, 1934   DATE OF PROCEDURE:  01/23/2005  DATE OF DISCHARGE:                                 OPERATIVE REPORT   PROCEDURE:  Colonoscopy with polypectomy.   INDICATIONS FOR PROCEDURE:  Patient with anemia probably due to ulcers.  We  want to make sure there is no problem with her colon.  Well overdue for  colonic screening.  Consent was signed after risks, benefits, methods, and  options were thoroughly discussed in the office with both the patient and  her family member who brought her.   MEDICINES USED:  Demerol 75 mg, Versed 4 mg.   DESCRIPTION OF PROCEDURE:  Rectal inspection was pertinent for external  hemorrhoids, small.  Digital examination was negative.  Video pediatric  adjustable colonoscope was inserted and fairly easily advanced around the  colon to the cecum.  This did not require any abdominal pressure or any  position changes.  No obvious abnormality was seen on insertion.  The cecum  was identified by the appendiceal orifice and the ileocecal valve.  The  scope was inserted a short ways into the terminal ileum which was normal.  Photodocumentation was obtained.  The scope was slowly withdrawn.  Prep was  adequate.  There was some liquid stool that required washing and suctioning.  On slow withdrawal through the colon in the more proximal ascending, a tiny  polyp was seen and was hot biopsied x2.  The scope was further withdrawn.  No other polypoid lesions, masses, diverticuli, or other abnormalities were  seen as we slowly withdrew back to the rectum.  Anal rectal pullthrough and  retroflexion confirmed the hemorrhoids.  The scope was straightened and  readvanced a short ways up the left side of the colon.  Air was suctioned  and the  scope removed.  The patient tolerated the procedure well.  There was  no obvious immediate complications.   ENDOSCOPIC ASSESSMENT:  1.  Internal and external hemorrhoids.  2.  Tiny ascending polyp hot biopsied.  3.  Otherwise within normal limits to the cecum and the terminal ileum.   PLAN:  Await pathology.  Probably recheck colon screening in five years if  doing well medically, otherwise continue workup with a repeat EGD to recheck  her ulcer.      MEM/MEDQ  D:  01/23/2005  T:  01/24/2005  Job:  [id number]   cc:   Ricki Rodriguez, M.D.  [address]  [place] [address]  Fax: [phone]

## 2011-05-03 NOTE — Op Note (Signed)
NAMEDAWNN, NAM                  ACCOUNT NO.:  [id number]   MEDICAL RECORD NO.:  [id number]          PATIENT TYPE:  INP   LOCATION:  3713                         FACILITY:  [facility]   PHYSICIAN:  Petra Kuba, M.D.    DATE OF BIRTH:  05/22/1934   DATE OF PROCEDURE:  11/21/2004  DATE OF DISCHARGE:                                 OPERATIVE REPORT   PROCEDURE:  Esophagogastroduodenoscopy with biopsy.   INDICATIONS:  Anemia in a patient on aspirin, B.C.'s, nonsteroidals.  Consent was signed after risks, benefits, methods, and options thoroughly  discussed in the hospital with both the patient and her daughter.   MEDICINES USED:  Demerol 30 mg, Versed 3 mg.   PROCEDURE:  The video endoscope was inserted by direct vision.  The  esophagus was normal.  She did have a tiny hiatal hernia.  The scope passed  into the stomach and advanced to the antrum, where some antral erosions were  seen as well as a deep yet small ulcer, which was biopsied at the end of the  procedure.  The scope passed through a normal pylorus into a duodenal bulb  pertinent for a few erosions.  She had a very tortuous C-loop.  We had  trouble getting a good look in this area but normal second portion of the  duodenum.  On slow withdrawal back to the bulb again, it was __________ to  maintain position in the C-loop, but the second portion of the duodenum was  normal.  The scope was withdrawn back to the stomach and retroflexed.  Cardia, fundus, angularis, lesser and greater curve were normal.  Straight  visualization of the stomach did not reveal any additional findings.  We  then obtained the biopsies of the antral ulcer from the edges as well as a  few biopsies of the antrum and a few of the proximal stomach to rule out  Helicobacter.  Air was suctioned and the scope slowly withdrawn.  Again a  good look at the esophagus was normal.  The scope was removed.  The patient  tolerated the procedure well.  There was no  obvious immediate complication.   ENDOSCOPIC DIAGNOSES:  1.  Tiny hiatal hernia.  2.  Deep yet small antral ulcer, status post biopsy.  3.  Antral erosions, status post biopsy, as well as proximal stomach biopsy      to rule out Helicobacter.  4.  Bulb erosions.  5.  Tortuous C-loop.  6.  Otherwise normal esophagogastroduodenoscopy.   PLAN:  1.  Await pathology.  2.  Pump inhibitors b.i.d. for now, can be decreased to once a day when      better.  3.  Recheck endoscopy in two months to document healing and also check a      colonoscopy probably at that time.  Follow up with me in one month in      the office to set that up.  4.  No aspirin or nonsteroidals long-term.  Could consider Plavix in a few      weeks if hemoglobin up after  using iron and guaiac negative.  Will need      different arthritis pills and possibly COX inhibitors at some point in      the future.  Will leave that to Dr. Algie Coffer.  Maybe a workup for her      arthritic pains, etc.       MEM/MEDQ  D:  11/21/2004  T:  11/22/2004  Job:  [id number]   cc:   Ricki Rodriguez, M.D.  [address]  [place] [address]  Fax: [phone]

## 2011-05-03 NOTE — Discharge Summary (Signed)
[name], [name]                  ACCOUNT NO.:  [id number]   MEDICAL RECORD NO.:  [id number]          PATIENT TYPE:  INP   LOCATION:  3713                         FACILITY:  [facility]   PHYSICIAN:  Ricki Rodriguez, M.D.  DATE OF BIRTH:  1934/05/05   DATE OF ADMISSION:  11/20/2004  DATE OF DISCHARGE:  11/22/2004                                 DISCHARGE SUMMARY   PRINCIPAL DIAGNOSES:  1.  Anemia of chronic blood loss.  2.  Angina pectoris.  3.  Hypertension.  4.  Hyperlipidemia.  5.  Long term use of aspirin.  6.  Stomach ulcer, nonspecific.  7.  Diaphragmatic hernia.   PRINCIPAL PROCEDURE:  Esophagogastroduodenoscopy by Dr. Vida Rigger done on  November 21, 2004.   DISCHARGE MEDICATIONS:  1.  Cardizem CD 240 mg one daily.  2.  Lipitor 20 mg one daily.  3.  Imdur 30 mg one daily.  4.  Tylenol 500 mg four times daily.  5.  Meclizine 12.5 mg daily.  6.  K-Dur 10 mEq one daily.  7.  Protonix 40 mg twice daily.  8.  Ferrous sulfate 325 mg one daily.  9.  Tylenol as needed.  The patient is to stop aspirin and aspirin products including Goody's  Powder, BC Powder, Advil, ibuprofen, and the patient to also stop  hydrochlorothiazide.   DISCHARGE ACTIVITY:  As tolerated.   DISCHARGE DIET:  Low fat, low salt diet.   FOLLOW UP:  Dr. Ricki Rodriguez in one week.  The patient is to call [phone] for an appointment.   CONDITION ON DISCHARGE:  Improved.   HISTORY:  This 77 year old black female presented with chest pain described  as a heaviness, improved with nitroglycerin use, along with exertional  dyspnea without nausea, plus loss of appetite, and black stools.   PHYSICAL EXAMINATION:  VITAL SIGNS:  Pulse 70, respirations 14, blood  pressure 125/60 , height 5 feet, weight 149 pounds.  HEENT:  The patient was normocephalic, atraumatic with brown eyes.  Mild  haziness of left lens.  The right lens implanted.  Positive arcus senilis.  Sinuses nontender.  Conjunctivae pale.  NECK:   No JVD.  No carotid bruits with a full range of motion.  LUNGS:  Clear bilaterally.  HEART:  Normal S1 S2 with a grade 3 systolic murmur.  ABDOMEN:  Soft and nontender.  There is a small scar of surgery.  EXTREMITIES:  With 1+ edema.  No cyanosis or clubbing.  Mild bilateral knee  swelling.  NEUROLOGIC:  Cranial nerves II-XII grossly intact.  Bilateral weak grips due  to arthritic changes in hands and the patient moves all four extremities.   LABORATORY DATA:  Revealed a hemoglobin of 5.7, hematocrit of 19.5, normal  WBC count, platelet count.  Normal BUN creatinine.  Sodium borderline at  134, potassium low at 2.8.  Subsequent sodium was 136, potassium was 3.4.  Liver enzymes slightly elevated.  CK-MB, troponin I normal x 2.  Lipid  profile showed a cholesterol of 77, triglycerides of 96, HDL cholesterol low  at 26.  Blood type was O.  RH type positive.  Antibody screen negative.  Esophagogastroduodenoscopy showed hiatal hernia, deep small antral ulcer,  antral erosion, gastric erosion.  Stomach antral biopsy revealed gastric  antral type mucosa with a focal fibrosis without metaplasia, dysplasia, or  malignancy.   HOSPITAL COURSE:  The patient was admitted to telemetry unit because of her  low hemoglobin.  She received three units of packed RBCs.  The patient's  hemoglobin improved to 9.4 gram/dL on November 21, 2004.  She also underwent  esophagogastroduodenoscopy by Dr. Vida Rigger and her endoscopy showed  gastritis with an antral ulcer.  The patient was strongly advised to  discontinue all aspirin products ibuprofen, Goody's Powder, etcetera.  Her  cardiac catheterization was postponed until her hemoglobin improves.  She  was started on Protonix 40 mg twice daily.  She will be followed by me in  one week and if her chest pain symptoms recur, she will undergo cardiac  catheterization at a later date.      ASK/MEDQ  D:  04/10/2005  T:  04/11/2005  Job:  [id number]

## 2011-05-03 NOTE — Op Note (Signed)
NAME:  [name], [name]                            ACCOUNT NO.:  [id number]   MEDICAL RECORD NO.:  [id number]                   PATIENT TYPE:  INP   LOCATION:  5735                                 FACILITY:  [facility]   PHYSICIAN:  Gabrielle Dare. Janee Morn, M.D.             DATE OF BIRTH:  [birth year]   DATE OF PROCEDURE:  02/03/2004  DATE OF DISCHARGE:                                 OPERATIVE REPORT   PREOPERATIVE DIAGNOSIS:  Acute cholecystitis.   POSTOPERATIVE DIAGNOSIS:  Acute cholecystitis.   PROCEDURE:  Laparoscopic cholecystectomy.   SURGEON:  Gabrielle Dare. Janee Morn, M.D.   ASSISTANT:  Gita Kudo, M.D.   ANESTHESIA:  General.   HISTORY OF PRESENT ILLNESS:  The patient is a 77 year old African American  female with a history of hypertension and a heart murmur, who presented to  the emergency room with signs and symptoms of acute cholecystitis including  a right upper quadrant ultrasound which showed gallstones and gallbladder  wall thickening. The patient continued to have vomiting and some pain and  had no other complaints, but the vomiting and pain persisted and she was  admitted with acute cholecystitis. She was given intravenous antibiotics and  she is brought to the operating room for cholecystectomy.   DESCRIPTION OF PROCEDURE:  Informed consent was obtained from the patient  and her daughter. She was receiving intravenous antibiotics. She was brought  to the operating room. General anesthesia was administered. Her abdomen  was  prepped and draped in a sterile fashion.   A supraumbilical  midline incision was made. The subcutaneous tissues were  dissected downward revealing the anterior fascia. This was divided sharply.  The peritoneal cavity was then entered under direct vision without  difficulty. A 0 Vicryl pursestring suture was placed around the fascial  opening and the Hasson trocar was inserted into the abdomen.  The abdomen was insufflated with CO2 in the standard  fashion.   Exploration revealed lots of adhesions between the liver and the anterior  abdominal wall consistent with Fitzhue-Curtis. Under direct vision an 11-mm  and two 5-mm ports were placed. A few of the adhesions were taken down  sharply to facilitate our exposure. Subsequently the dome of the gallbladder  was retracted superomedially. The infundibulum was then retracted  inferolaterally.   Dissection began laterally and progressed medially, identifying the cystic  duct which was a narrow caliber and only moderate length. Circumferential  dissection was completed, making a good window between the infundibulum, the  cystic duct and the liver. During this dissection the cystic artery was  dissected. Excellent visualization of the anatomy was obtained.   A clip was placed on the infundibular cystic duct junction and a Cook  cholangiogram  catheter was inserted into the abdomen. A small nick was made  in the cystic duct. Several attempts were made to insert the [facility] catheter  into the cystic duct. The cystic  duct was a very narrow caliber and we could  not get a good cholangiogram  despite multiple attempts, and this was  abandoned.   The patient's liver function tests __________ were within normal limits.  Subsequently 3 clips were placed proximally  on the cystic duct and it was  divided. The cystic artery was then clamped twice proximally, once distally  and divided. The gallbladder  was then taken off the liver bed with Bovie  cautery. Several areas on the liver bed were cauterized to get excellent  hemostasis and the gallbladder  was placed in an EndoCatch bag and taken out  of the abdomen  via the supraumbilical  port site.   Once this was accomplished, the abdomen was copiously irrigated. The liver  bed was cauterized to get excellent hemostasis and the irrigant was  returned. The final irrigant was  returning clear. The liver bed was  reinspected and hemostasis was  noted.   Once this was accomplished, the ports were removed under direct vision. The  pneumoperitoneum was released and the Hasson trocar was taken out of the  abdomen. The supraumbilical port site was closed by tying the 0 Vicryl  pursestring suture, being careful not to trap any intraabdominal contents.   Once this was accomplished, all 4 wounds were copiously irrigated. Marcaine  0.25% with epinephrine had been used for local anesthetic at each site and  these were all closed with a running 4-0 Vicryl subcuticular stitch after  irrigating them. All sponge, instrument and needle counts were correct.  Benzoin and Steri-Strips and sterile dressings were applied.   The patient tolerated the procedure well without apparent complications. She  was taken to the recovery room in stable condition.                                               Gabrielle Dare Janee Morn, M.D.    BET/MEDQ  D:  02/03/2004  T:  02/04/2004  Job:  [id number]

## 2011-05-03 NOTE — Discharge Summary (Signed)
[name], [name]                  ACCOUNT NO.:  [id number]   MEDICAL RECORD NO.:  [id number]          PATIENT TYPE:  INP   LOCATION:  6526                         FACILITY:  [facility]   PHYSICIAN:  Ricki Rodriguez, M.D.  DATE OF BIRTH:  30-Jun-1934   DATE OF ADMISSION:  02/23/2008  DATE OF DISCHARGE:  02/25/2008                               DISCHARGE SUMMARY   FINAL DIAGNOSES:  1. Rectal bleed.  2. Post hemorrhagic anemia.  3. Long-term use of anticoagulant.  4. Personal history of venous thrombosis and embolism.  5. Hypertension.  6. Arthropathy.  7. Hyperlipidemia.  8. Diaphragmatic hernia.   DISCHARGE MEDICATIONS:  1. KCl 10 mEq 1 daily.  2. Zocor 40 mg 1 in the evening.  3. Meclizine 12.5 mg 1 three times daily as needed.  4. Cartia XT 120 mg 1 daily.  5. Omeprazole 20 mg 1 daily.  6. Nitroglycerin 0.4 mg 1 sublingual every 5 minutes x3 as needed for      chest pain.  7. Ferrous sulfate 325 mg daily.  8. Multivitamin with iron 1 daily.   The patient to discontinue Coumadin, Flexeril, and Imdur.  Followup by Dr. Orpah Cobb in 1 week.  The patient to call [phone]  for appointment and to see Dr. Madilyn Fireman as arranged.  The patient and her  family to call [phone].   DISCHARGE DIET:  Low-sodium heart-healthy diet.   DISCHARGE ACTIVITY:  The patient increase activity slowly.   HISTORY:  This 77 year old black female presented with exertional  dyspnea along with a black tarry stool x60 days.  The patient denied any  chest pain, cough, or fever.  Did admit to nausea.  Had been recently  started on Coumadin for possible lower extremity DVT.   PHYSICAL EXAMINATION:  VITAL SIGNS:  Temperature 99.6, pulse 120,  respirations 18, and blood pressure 135/78.  GENERAL:  The patient is averagely built and nourished in no acute  distress.  HEENT:  The patient is normocephalic and atraumatic with a pale  conjunctivae and tongue midline and pale.  NECK:  No JVD.  LUNGS:  Clear.  HEART:  Normal S1 and S2.  ABDOMEN:  Soft and nontender.  EXTREMITIES:  Trace edema.  Negative cyanosis or clubbing.  SKIN:  Warm.  NEUROLOGICAL:  Grossly intact.   LABORATORY DATA:  Hemoglobin down to 4.9, hematocrit 15.2,  WBC count  12,500,  and platelet count  235,000.  EKG sinus tachycardia with  occasional pleurodesis.   HOSPITAL COURSE:  The patient was admitted to telemetry unit.  She was  started on packed red cells transfusion.  Her condition improved  overnight.  GI consult was obtained.  She was taken off her NSAIDs,  aspirin, and Plavix.  The patient had  esophagogastroduodenoscopy that  showed hiatal hernia.  No acute source of GI bleed was found.  The  patient's condition remained stable after 3 units of packed red cells  and she was discharged home in satisfactory condition with a followup by  me and by Dr. Madilyn Fireman.      Ricki Rodriguez,  M.D.  Electronically Signed     ASK/MEDQ  D:  03/31/2008  T:  03/31/2008  Job:  [id number]

## 2011-05-03 NOTE — H&P (Signed)
NAME:  [name], [name]                            ACCOUNT NO.:  [id number]   MEDICAL RECORD NO.:  [id number]                   PATIENT TYPE:  EMS   LOCATION:  MAJO                                 FACILITY:  [facility]   PHYSICIAN:  Gabrielle Dare. Janee Morn, M.D.             DATE OF BIRTH:  [birth year]   DATE OF ADMISSION:  02/03/2004  DATE OF DISCHARGE:                                HISTORY & PHYSICAL   CHIEF COMPLAINT:  Nausea, vomiting and right upper quadrant pain.   HISTORY OF PRESENT ILLNESS:  The patient is a 77 year old African American  female with a history of hypertension and a heart murmur, who is a patient  of Dr. Ricki Rodriguez, and she complains of nausea and vomiting since about  3 a.m. with some associated right upper quadrant pain.  She came to the  [facility] Emergency Department with her daughter, where evaluations  included a right upper quadrant ultrasound that showed cholelithiasis and  some gallbladder wall thickening.  The patient continues to have persistent  vomiting and some pain in her right upper quadrant, white blood cell count  8.5 and she has no other current complaint.   PAST MEDICAL HISTORY:  1. Hypertension.  2. Heart murmur per Dr. Algie Coffer and she had an echo on May 17, 2003 that     showed normal left ventricular function.   PAST SURGICAL HISTORY:  Hysterectomy.   SOCIAL HISTORY:  She does not smoke or drink alcohol.   ALLERGIES:  No known drug allergies.   MEDICATIONS:  Medications include:  1. Diltiazem CD 240 mg p.o. daily.  2. K-Dur.  3. Hydrochlorothiazide 12.5 mg p.o. daily.  4. Lipitor.  5. Hyoscyamine p.r.n.  6. Nitroglycerin p.r.n., but she not taken that for over a year.   REVIEW OF SYSTEMS:  GENERAL:  She is a little bit sedated after receiving  Phenergan.  CARDIAC:  No current chest pain.  PULMONARY:  No complaints.  GI:  See the history of present illness.  She has no complaints with her  bowel movement.  GU:  No complaints.   MUSCULOSKELETAL:  No complaints.   PHYSICAL EXAM:  GENERAL:  She is awake and in no distress.  VITAL SIGNS:  Temperature is 98.3, blood pressure 130/72, pulse 96,  respirations 28, saturations 94%.  HEENT:  Pupils are equal, round and reactive.  NECK:  Neck is supple with no masses.  LUNGS:  Lungs are clear to auscultation bilaterally with good respiratory  excursion.  HEART:  Heart has a regular rate and rhythm with a 3/6 systolic ejection  murmur with click. PMI is palpable in the left chest.  Distal pulses are 2+  and equal at dorsalis pedis.  ABDOMEN:  Her abdomen is soft.  There is some mild-to-moderate right upper  quadrant tenderness with no guarding.  Bowel sounds are normal.  No  hepatosplenomegaly is palpable.  The  remainder of her abdomen is soft and  nontender.  SKIN:  Skin is warm with no rashes.   DATA REVIEWED:  Data reviewed include the ultrasound report as above; white  blood cell count 8.5, hemoglobin 11.6, platelets 268,000; sodium 137,  potassium 3.3, chloride 100, carbon dioxide 27, glucose 150, BUN 13,  creatinine 0.9; AST 25, ALT 16, alkaline phosphatase 107, bilirubin 0.4.   IMPRESSION AND PLAN:  Cholecystitis.  I discussed the patient's operative  risks in detail with her primary medical doctor, Dr. Algie Coffer, and he feels  she is cleared for surgery with low cardiac risk for surgery.  We will plan  to admit her, place her on intravenous antibiotics and proceed with  laparoscopic cholecystectomy and intraoperative cholangiogram.  The  procedure, risks and benefits were discussed with the patient and her  daughter and they are agreeable.                                                Gabrielle Dare Janee Morn, M.D.    BET/MEDQ  D:  02/03/2004  T:  02/04/2004  Job:  [id number]

## 2011-05-03 NOTE — Discharge Summary (Signed)
[name], TRUAX                  ACCOUNT NO.:  [id number]   MEDICAL RECORD NO.:  [id number]          PATIENT TYPE:  INP   LOCATION:  3705                         FACILITY:  [facility]   PHYSICIAN:  Ricki Rodriguez, M.D.  DATE OF BIRTH:  August 13, 1934   DATE OF ADMISSION:  12/23/2008  DATE OF DISCHARGE:  12/26/2008                               DISCHARGE SUMMARY   FINAL DIAGNOSES:  1. Chronic blood loss anemia.  2. Gastroesophageal hemorrhage.  3. Hypertension.  4. Hyperlipidemia.  5. Constipation.  6. 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   PRINCIPAL PROCEDURE:  Esophagogastroduodenoscopy with closed biopsy and  endoscopic polypectomy of large intestine done by Dr. Dorena Cookey.   DISCHARGE MEDICATIONS:  1. Diltiazem extended 240 mg 1 daily.  2. Meclizine 12.5 mg 1 daily.  3. Metoprolol 50 mg 1/2 twice daily.  4. Simvastatin 40 mg 1 daily.  5. Ferrous sulfate 325 mg 1 daily.  6. Hydrochlorothiazide 12.5 mg 1 daily.  7. Prilosec 20 mg 1 twice daily.  8. Tylenol 325 mg 4 times daily as needed.  9. Nitroglycerin 0.4 mg 1 tablet sublingual every 5 minutes x3 as      needed for chest pain.  10.Multivitamin 1 daily.  11.Calcium 600 mg plus vitamin D 1 daily.  12.K-Dur 10 mEq 2 in the morning and 1 in the evening.   DISCHARGE DIET:  Low-sodium, heart-healthy diet.   DISCHARGE ACTIVITY:  The patient is to increase activity slowly.   SPECIAL INSTRUCTIONS:  The patient is to stop any activity that causes  chest pain, shortness of breath, dizziness, sweating, or excessive  weakness.   FOLLOWUP:  By Dr. Orpah Cobb in 1 week.  The patient is to call [phone] for appointment.   HISTORY:  This 77 year old black female presented with exertional  dyspnea.  Her hemoglobin was found to be 6.5.  The patient did admit to  dark stool a week or two ago prior to hospitalization.  She had a  history of documented  gastric ulcer in 2005.   PHYSICAL EXAMINATION:  HEENT:  The patient was  normocephalic and  atraumatic with brown eyes.  Conjunctivae are pale.  Tongue is pale.  Sclerae are white.  NECK:  No JVD.  LUNGS:  Clear bilaterally.  HEART:  Normal.  S1 and S2 with grade 3/6 systolic murmur at left  sternal border and apical area.  ABDOMEN:  Soft.  EXTREMITIES:  Trace edema and no cyanosis or clubbing.  SKIN:  Warm and dry.  NEUROLOGIC:  Cranial nerves are grossly intact and the patient moves all  4 extremities.   LABORATORY DATA:  Hemoglobin 6.4, hematocrit 20, WBC count normal, and  platelet count borderline at .  Basic metabolic panel was essentially  unremarkable.  Albumin was slightly low at 2.4.  Urinalysis was mostly  unremarkable with no growth on urine culture and H. pylori tests were  also negative.  The patient had received 2 units of blood transfusion.  Blood group was O+.  Esophagogastroduodenoscopy was essentially  unremarkable.  Colonoscopy revealed a large polyp  of 1.5 cm in the  transverse colon, otherwise unremarkable.   HOSPITAL COURSE:  The patient was admitted to telemetry unit.  She  underwent esophagogastroduodenoscopy that failed to show any bleeding  ulcer.  She received 2 units of blood.  She then underwent colonoscopy  and large polyp was discovered but no significant source of bleed.  The  patient's condition otherwise remained stable.  If she has bleeding in  the future, she will be a candidate for capsule endoscopy and she was  discharged home in satisfactory condition with followup by me in 1 week  and by GI physician in 1 month.      Ricki Rodriguez, M.D.  Electronically Signed     ASK/MEDQ  D:  02/02/2009  T:  02/02/2009  Job:  [id number]

## 2011-05-03 NOTE — Op Note (Signed)
[name], [name]                  ACCOUNT NO.:  [id number]   MEDICAL RECORD NO.:  [id number]          PATIENT TYPE:  AMB   LOCATION:  ENDO                         FACILITY:  [facility]   PHYSICIAN:  Petra Kuba, M.D.    DATE OF BIRTH:  [birth year]   DATE OF PROCEDURE:  01/23/2005  DATE OF DISCHARGE:                                 OPERATIVE REPORT   PROCEDURE:  EGD with biopsy.   INDICATIONS FOR PROCEDURE:  Patient with a history of gastric ulcer, want to  document healing.  Consent was signed after risks, benefits, methods, and  options were thoroughly discussed multiple times in the past.   ADDITIONAL MEDICINES FOR THIS PROCEDURE:  1 mg of Versed and 25 mg of  Demerol.   DESCRIPTION OF PROCEDURE:  The video endoscope was inserted by direct  vision.  The proximal, mid, and distal esophagus were normal.  She did have  a small hiatal hernia.  The scope was passed into the stomach and advanced  to the antrum.  You could see where the ulcer had been and it had nicely  healed.  There was some antritis, but no more erosions or ulcerations.  The  scope was inserted through a normal pyloris into a normal duodenal bulb and  around the C-loop to a normal second portion of the duodenum.  The scope was  withdrawn back to the bulb and a good look there ruled out ulcers in that  location.  The scope was withdrawn back to the stomach and retroflexed.  Angularis, cardia, fundus, lesser and greater curve were normal on  retroflexed visualization.  Straight visualization of the stomach did not  reveal any additional findings.  We went ahead and took some biopsies of the  healed ulcer site as well as a few of the antrum, again to rule out  Helicobacter.  Air was suctioned.  The scope was slowly withdrawn.  Again a  good look at the esophagus was normal.  The scope was removed.  The patient  tolerated the procedure well and there was no immediate complication.   ENDOSCOPIC ASSESSMENT:  1.  Small  hiatal hernia.  2.  Antral ulcer, healed, status post biopsy with minimal residual antritis,      status post biopsy.  3.  Otherwise normal esophagogastroduodenoscopy.   PLAN:  Await pathology.  Would use Prilosec over-the-counter p.r.n. symptoms  or everyday to prevent a problem if she ever gets on aspirin or  nonsteroidals.  Happy to see back p.r.n., otherwise return care to Ricki Rodriguez, M.D. for the customary health care maintenance to include yearly  rectals and guaiacs.      MEM/MEDQ  D:  01/23/2005  T:  01/24/2005  Job:  [id number]   cc:   Ricki Rodriguez, M.D.  [address]  [place] [address]  Fax: [phone]

## 2011-05-03 NOTE — H&P (Signed)
[name], [name]                  ACCOUNT NO.:  [id number]   MEDICAL RECORD NO.:  [id number]          PATIENT TYPE:  INP   LOCATION:  3713                         FACILITY:  [facility]   PHYSICIAN:  Ricki Rodriguez, M.D.  DATE OF BIRTH:  12/04/1934   DATE OF ADMISSION:  11/20/2004  DATE OF DISCHARGE:                                HISTORY & PHYSICAL   CHIEF COMPLAINT:  Chest pain described with heaviness along with  nitroglycerin use and exertional dyspnea without nausea.  Also complained of  loss of appetite, neck pain, and right ear buzzing.   HISTORY OF PRESENT ILLNESS:  This 77 year old black female complains of  substernal chest heaviness lasting for 5 to 10 minutes requiring  nitroglycerin use and shortness of breath along with exertional dyspnea for  the last month, more so in the last few days.  The patient also admits to  poor appetite and has bilateral leg edema.   PAST MEDICAL HISTORY:  Negative for diabetes, positive for hypertension for  15 years.  Chewed tobacco for 50 years.  No history of smoking or alcohol  intake.  No history of elevated cholesterol level, myocardial infarction,  however, there is a history of coronary artery disease in the family.   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   MEDICATIONS:  1.  Diltiazem 240 mg daily.  2.  Aspirin 325 mg daily.  3.  Meclozine 12.5 mg daily.  4.  Lipitor 10 mg daily.  5.  Hydrochlorothiazide 12.5 mg daily.  6.  IMDUR 30 mg daily.  7.  Theo-Dur 10 mEq daily.  8.  Diclofenac 75 mg one twice daily.  9.  Nitroglycerin 0.4 mg one sublingual every 5 minutes x3 as needed for      chest pain.  10. Flexeril 10 mg one at bedtime.   ALLERGIES:  No known drug allergies.   SOCIAL HISTORY:  The patient is a widow.  Has two children, one son 46 years  old, and one daughter 48 years old.   FAMILY HISTORY:   Essentially noncontributory.   REVIEW OF SYSTEMS:  Negative for weight gain.  The patient wears glasses.  No hearing problems.  The patient has upper and lower dentures.  No history  of asthma, no history of bronchitis, no history of pneumonia, no history of  heart palpitations.  Positive history of dizziness, positive history of  chest pain.  No history of heart failure, no history of peptic ulcer  disease, bleeding in the bowel, hepatitis, or blood transfusion in the past.  No history of kidney stone or hematuria in the past.  No history of stroke,  seizures, or psychiatric admissions.   PHYSICAL EXAMINATION:  VITAL SIGNS:  Pulse 70, respirations 14, blood  pressure 120/68, height 5 feet, weight 149 pounds.  HEENT:  The patient is normocephalic and atraumatic with brown eyes, mild  haziness of left lens, right lens implanted.  Positive arcus senilis.  Sinuses nontender.  Conjunctivae pale.  NECK:  No JVD, no carotid bruit.  Full range of motion.  LUNGS:  Clear bilaterally.  HEART:  Normal S1 and S2 with grade 3 systolic murmur.  ABDOMEN:  Soft and nontender with a small scar of surgery.  EXTREMITIES:  1+ edema, no cyanosis or clubbing.  Mild bilateral knee  swelling.  NEUROLOGY:  Cranial nerves II-XII grossly intact.  Bilateral weak grips due  to arthritic changes in hands.  The patient moves all four extremities.   LABORATORY DATA:  Pending.   EKG pending.   Chest x-ray pending.   IMPRESSION:  1.  Angina, rule out coronary artery disease.  2.  Hypertension.  3.  Hyperlipidemia.  4.  Arthritis.  5.  Anemia.   PLAN:  Admit the patient to telemetry unit, rule out myocardial infarction.  Check CBC, CMET, check EKG, chest x-ray.  Schedule the patient for cardiac  catheterization if stable.      Ajay   ASK/MEDQ  D:  11/20/2004  T:  11/21/2004  Job:  [id number]

## 2011-09-09 LAB — CBC
HCT: 15.2 — ABNORMAL LOW
HCT: 23.9 — ABNORMAL LOW
HCT: 26.6 — ABNORMAL LOW
Hemoglobin: 8.2 — ABNORMAL LOW
Hemoglobin: 9.1 — ABNORMAL LOW
MCHC: 34.3
MCV: 85.4
Platelets: 235
RBC: 1.78 — ABNORMAL LOW
RBC: 2.83 — ABNORMAL LOW
RBC: 3.09 — ABNORMAL LOW
WBC: 10.5
WBC: 12.5 — ABNORMAL HIGH
WBC: 8.1

## 2011-09-09 LAB — BASIC METABOLIC PANEL
CO2: 27
Calcium: 8.3 — ABNORMAL LOW
Chloride: 102
GFR calc Af Amer: >60
GFR calc non Af Amer: >60
Glucose, Bld: 110 — ABNORMAL HIGH
Potassium: 3 — ABNORMAL LOW
Potassium: 3.3 — ABNORMAL LOW
Sodium: 136
Sodium: 137

## 2011-09-09 LAB — CROSSMATCH

## 2011-09-09 LAB — COMPREHENSIVE METABOLIC PANEL
ALT: 12
AST: 23
Albumin: 3.2 — ABNORMAL LOW
CO2: 25
Calcium: 8.4
Chloride: 100
Creatinine, Ser: 0.94
GFR calc Af Amer: >60
Sodium: 134 — ABNORMAL LOW

## 2011-09-09 LAB — DIFFERENTIAL
Eosinophils Absolute: 0
Eosinophils Relative: 0
Lymphocytes Relative: 9 — ABNORMAL LOW
Lymphs Abs: 1.2
Monocytes Absolute: 0.5

## 2011-09-09 LAB — CLOTEST (H. PYLORI), BIOPSY: Helicobacter screen: NEGATIVE

## 2011-09-09 LAB — APTT: aPTT: 33

## 2011-09-09 LAB — CK TOTAL AND CKMB (NOT AT ARMC)
CK, MB: 0.8
Relative Index: INVALID

## 2011-09-09 LAB — PROTIME-INR
INR: 1.2
INR: 3 — ABNORMAL HIGH
Prothrombin Time: 15

## 2011-09-09 LAB — B-NATRIURETIC PEPTIDE (CONVERTED LAB): Pro B Natriuretic peptide (BNP): <30

## 2011-09-27 LAB — COMPREHENSIVE METABOLIC PANEL
AST: 22
BUN: 10
CO2: 28
Calcium: 9.6
Creatinine, Ser: 1.24 — ABNORMAL HIGH
GFR calc Af Amer: 52 — ABNORMAL LOW
GFR calc non Af Amer: 43 — ABNORMAL LOW

## 2011-09-27 LAB — APTT: aPTT: 32

## 2011-09-27 LAB — CBC
HCT: 40.2
Hemoglobin: 13.1
WBC: 7.1

## 2011-09-27 LAB — DIFFERENTIAL
Eosinophils Absolute: 0.1
Lymphocytes Relative: 33
Lymphs Abs: 2.3
Neutrophils Relative %: 58

## 2011-09-27 LAB — CK TOTAL AND CKMB (NOT AT ARMC)
CK, MB: 0.9
CK, MB: 1.1
Relative Index: INVALID

## 2011-09-27 LAB — TROPONIN I
Troponin I: 0.01
Troponin I: 0.01

## 2011-09-27 LAB — LIPID PANEL
HDL: 65
Total CHOL/HDL Ratio: 2
Triglycerides: 74
VLDL: 15

## 2011-09-27 LAB — PROTIME-INR
INR: 1
Prothrombin Time: 13.8

## 2014-05-24 ENCOUNTER — Encounter (HOSPITAL_COMMUNITY): Payer: Self-pay | Admitting: Pharmacy Technician

## 2014-05-24 ENCOUNTER — Other Ambulatory Visit: Payer: Self-pay | Admitting: Orthopedic Surgery

## 2014-05-25 ENCOUNTER — Ambulatory Visit (HOSPITAL_COMMUNITY)
Admission: RE | Admit: 2014-05-25 | Discharge: 2014-05-25 | Disposition: A | Discharge status: Home / Self Care | Payer: Medicare Other | Source: Ambulatory Visit | Attending: Orthopedic Surgery | Admitting: Orthopedic Surgery

## 2014-05-25 ENCOUNTER — Encounter (HOSPITAL_COMMUNITY)
Admission: RE | Admit: 2014-05-25 | Discharge: 2014-05-25 | Disposition: A | Discharge status: Home / Self Care | Payer: Medicare Other | Source: Ambulatory Visit | Attending: Orthopedic Surgery | Admitting: Orthopedic Surgery

## 2014-05-25 ENCOUNTER — Encounter (HOSPITAL_COMMUNITY): Payer: Self-pay

## 2014-05-25 DIAGNOSIS — Z01818 Encounter for other preprocedural examination: Secondary | ICD-10-CM | POA: Diagnosis present

## 2014-05-25 DIAGNOSIS — I517 Cardiomegaly: Secondary | ICD-10-CM | POA: Diagnosis not present

## 2014-05-25 HISTORY — DX: Family history of other specified conditions: Z84.89

## 2014-05-25 HISTORY — DX: Hyperlipidemia, unspecified: E78.5

## 2014-05-25 HISTORY — DX: Frequency of micturition: R35.0

## 2014-05-25 HISTORY — DX: Unspecified osteoarthritis, unspecified site: M19.90

## 2014-05-25 HISTORY — DX: Gastro-esophageal reflux disease without esophagitis: K21.9

## 2014-05-25 HISTORY — DX: Shortness of breath: R06.02

## 2014-05-25 HISTORY — DX: Essential (primary) hypertension: I10

## 2014-05-25 LAB — PROTIME-INR
INR: 1.07 (ref 0.00–1.49)
PROTHROMBIN TIME: 13.7 s (ref 11.6–15.2)

## 2014-05-25 LAB — TYPE AND SCREEN
ABO/RH(D): O POS
Antibody Screen: NEGATIVE

## 2014-05-25 LAB — URINALYSIS, ROUTINE W REFLEX MICROSCOPIC
Bilirubin Urine: NEGATIVE
Glucose, UA: NEGATIVE mg/dL
Hgb urine dipstick: NEGATIVE
Ketones, ur: 15 mg/dL — AB
LEUKOCYTES UA: NEGATIVE
NITRITE: NEGATIVE
PROTEIN: NEGATIVE mg/dL
SPECIFIC GRAVITY, URINE: 1.033 — AB (ref 1.005–1.030)
UROBILINOGEN UA: 0.2 mg/dL (ref 0.0–1.0)
pH: 5.5 (ref 5.0–8.0)

## 2014-05-25 LAB — COMPREHENSIVE METABOLIC PANEL
ALT: 6 U/L (ref 0–35)
AST: 17 U/L (ref 0–37)
Albumin: 3.8 g/dL (ref 3.5–5.2)
Alkaline Phosphatase: 99 U/L (ref 39–117)
BUN: 16 mg/dL (ref 6–23)
CALCIUM: 9.5 mg/dL (ref 8.4–10.5)
CO2: 23 mEq/L (ref 19–32)
Chloride: 100 mEq/L (ref 96–112)
Creatinine, Ser: 0.96 mg/dL (ref 0.50–1.10)
GFR calc non Af Amer: 56 mL/min — ABNORMAL LOW (ref >90)
GFR, EST AFRICAN AMERICAN: 64 mL/min — AB (ref >90)
GLUCOSE: 88 mg/dL (ref 70–99)
POTASSIUM: 3.5 meq/L — AB (ref 3.7–5.3)
Sodium: 140 mEq/L (ref 137–147)
TOTAL PROTEIN: 7.6 g/dL (ref 6.0–8.3)
Total Bilirubin: 0.3 mg/dL (ref 0.3–1.2)

## 2014-05-25 LAB — CBC WITH DIFFERENTIAL/PLATELET
Basophils Absolute: 0 10*3/uL (ref 0.0–0.1)
Basophils Relative: 0 % (ref 0–1)
EOS ABS: 0 10*3/uL (ref 0.0–0.7)
Eosinophils Relative: 1 % (ref 0–5)
HCT: 41.8 % (ref 36.0–46.0)
HEMOGLOBIN: 13.7 g/dL (ref 12.0–15.0)
LYMPHS ABS: 1.8 10*3/uL (ref 0.7–4.0)
Lymphocytes Relative: 32 % (ref 12–46)
MCH: 28.2 pg (ref 26.0–34.0)
MCHC: 32.8 g/dL (ref 30.0–36.0)
MCV: 86 fL (ref 78.0–100.0)
MONOS PCT: 8 % (ref 3–12)
Monocytes Absolute: 0.4 10*3/uL (ref 0.1–1.0)
NEUTROS PCT: 59 % (ref 43–77)
Neutro Abs: 3.3 10*3/uL (ref 1.7–7.7)
PLATELETS: 200 10*3/uL (ref 150–400)
RBC: 4.86 MIL/uL (ref 3.87–5.11)
RDW: 14.2 % (ref 11.5–15.5)
WBC: 5.5 10*3/uL (ref 4.0–10.5)

## 2014-05-25 LAB — SURGICAL PCR SCREEN
MRSA, PCR: NEGATIVE
STAPHYLOCOCCUS AUREUS: NEGATIVE

## 2014-05-25 LAB — APTT: aPTT: 31 seconds (ref 24–37)

## 2014-05-25 NOTE — Pre-Procedure Instructions (Signed)
[name]  05/25/2014   Your procedure is scheduled on:  Friday June 03, 2014 at 11:04 AM.  Report to [facility] Admitting at 9:00 AM.  Call this number if you have problems the morning of surgery: [phone]   Remember:   Do not eat food or drink liquids after midnight.   Take these medicines the morning of surgery with A SIP OF WATER: Acetaminophen (Tylenol) if needed, Diltiazem (Dilacor), Metoprolol (Lopressor), Omeprazole (Prilosec), Afrin nasal spray if needed, and Eye drops if needed   Do not wear jewelry, make-up or nail polish.  Do not wear lotions, powders, or perfumes. You may NOT wear deodorant.  Do not shave 48 hours prior to surgery.   Do not bring valuables to the hospital.  [facility] is not responsible for any belongings or valuables.               Contacts, dentures or bridgework may not be worn into surgery.  Leave suitcase in the car. After surgery it may be brought to your room.  For patients admitted to the hospital, discharge time is determined by your treatment team.               Patients discharged the day of surgery will not be allowed to drive home.  Name and phone number of your driver: Family/Friend  Special Instructions: Shower using CHG soap the night before and the morning of your surgery   Please read over the following fact sheets that you were given: Pain Booklet, Coughing and Deep Breathing, Blood Transfusion Information, Total Joint Packet, MRSA Information and Surgical Site Infection Prevention

## 2014-05-25 NOTE — Progress Notes (Signed)
Nurse called and left a voicemail with Beki at Dr. Luiz Blare office requesting that it be confirmed whether patient was having a total right hip or right knee replacement.

## 2014-05-25 NOTE — Progress Notes (Signed)
PCP is Dr. Loleta Chance at the [facility] and Cardiologist was Dr. Orpah Cobb but due to insurance issues patient no longer sees him. Patient denied having any acute cardiac or pulmonary issues. Patient informed Nurse that she had a stress test within the last 5 years but denied having a cardiac cath or sleep study. Will request records.

## 2014-05-26 NOTE — Progress Notes (Addendum)
Anesthesia chart review: Patient is a 80 year old female scheduled for right TKA on 06/03/14 by Dr. Luiz Blare.  History includes MVP with severe MR and PFO s/p right mini-thoracotomy for MV repair with quadrangular resection and ring annuloplasty and PFO closure 07/05/09, former smoker, HTN, GERD, HLD, arthritis, dyspnea on exertion, cataract extraction, hysterectomy. BMI is 30.33 consistent with mild obesity. She reports that her daughter takes a long time to awake from anesthesia. She was seeing cardiologist Dr. Algie Coffer as her cardiologist and PCP, but due to insurance changes she is now seeing Dr. Loleta Chance at the [facility]. Her last visit with Dr. Algie Coffer was ~ 12/2013. Patient reports that Dr. Loleta Chance cleared her for surgery, but I am still waiting to hear back from Dr. Luiz Blare' office.     EKG on 05/25/14 showed: NSR, possible LAE, LAFB, LVH with repolarization abnormality, cannot rule out septal infarct (age undetermined), poor quality tracing (particularly involving V4-6).  Currently, her last echo in Epic is pre-MVR.  According to 06/09/09 CT surgery notes, "Cardiac catheterization is also reviewed. This demonstrates codominant coronary circulation with normal coronary artery anatomy. No significant coronary artery disease. Left ventricular systolic function appears preserved. There is obvious severe mitral regurgitation." Encounter was 05/03/09 with Dr. Algie Coffer, but there is no cath report noted in Epic, E-chart, or Access Anywhere.  CXR on 05/25/14 showed: Cardiomegaly. No active disease. Again noted mitral valve prosthesis.  Preoperative labs noted.   I called and spoke with patient.  She denies chest pain or new SOB.  She does get swelling around her joints which she attributes to arthritis.  I told her that we would be following up with Dr. Luiz Blare' office regarding any surgical clearance and that I would be contacting Dr. Roseanne Kaufman office for her most recent cardiology testing.  Further recommendations  pending receipt of this information.  Velna Ochs [facility] Short Stay Center/Anesthesiology Phone [phone] 05/26/2014 4:40 PM  Addendum 05/27/2014 14:20 PM: Additional records received from Dr. Roseanne Kaufman office.  There was no recent stress test.  The last echo done was on 10/22/11 and showed: Normal LV size and mild septal hypokinesis with mild systolic and diastolic dysfunction. EF 55%. Mild LVH. Normal RV size and systolic function. Normal LA and RA size. MV with annuloplasty ring, minimal MR and mild MS. Normal TV with mild TR. Normal AV.  Mild PI. Mildly elevated pulmonary systolic pressure. MV area 1.29-1.76 cm2. No intracardia mass. No pericardial effusion.  I reviewed above with anesthesiologist Dr. Noreene Larsson.  If she has been cleared by Dr. Loleta Chance and denies any new CV/CHF symptoms then he felt she could proceed as planned.  I spoke with Albin Felling at Dr. Luiz Blare' office.  A clearance note has already been faxed to Dr. Loleta Chance, but their office is still awaiting a response. Chart was left for Short Stay RN follow-up.

## 2014-05-31 ENCOUNTER — Other Ambulatory Visit (HOSPITAL_COMMUNITY): Payer: Self-pay | Admitting: Cardiology

## 2014-05-31 DIAGNOSIS — Z01818 Encounter for other preprocedural examination: Secondary | ICD-10-CM

## 2014-05-31 DIAGNOSIS — R0602 Shortness of breath: Secondary | ICD-10-CM

## 2014-05-31 NOTE — Progress Notes (Signed)
Message left on answering machine at Dr Myrtie CruiseHill's [facility] office number, for medical clearance.  Message left to fax over medical clearance.

## 2014-06-02 ENCOUNTER — Encounter (HOSPITAL_COMMUNITY)
Admission: RE | Admit: 2014-06-02 | Discharge: 2014-06-02 | Disposition: A | Discharge status: Home / Self Care | Payer: Medicare Other | Source: Ambulatory Visit | Attending: Cardiology | Admitting: Cardiology

## 2014-06-02 ENCOUNTER — Encounter (HOSPITAL_COMMUNITY): Payer: Medicare Other

## 2014-06-02 ENCOUNTER — Other Ambulatory Visit: Payer: Self-pay

## 2014-06-02 DIAGNOSIS — R0602 Shortness of breath: Secondary | ICD-10-CM | POA: Insufficient documentation

## 2014-06-02 DIAGNOSIS — Z01818 Encounter for other preprocedural examination: Secondary | ICD-10-CM

## 2014-06-02 MED ORDER — REGADENOSON 0.4 MG/5ML IV SOLN
INTRAVENOUS | Status: AC
  Administered 2014-06-02: 0.4 mg
  Filled 2014-06-02: qty 5

## 2014-06-02 MED ORDER — TECHNETIUM TC 99M SESTAMIBI GENERIC - CARDIOLITE
30.0000 | Freq: Once | INTRAVENOUS | Status: AC | PRN
  Administered 2014-06-02: 30 via INTRAVENOUS

## 2014-06-02 MED ORDER — TECHNETIUM TC 99M SESTAMIBI GENERIC - CARDIOLITE
10.0000 | Freq: Once | INTRAVENOUS | Status: AC | PRN
Start: 2014-06-02 — End: 2014-06-02
  Administered 2014-06-02: 10 via INTRAVENOUS

## 2014-06-02 MED ORDER — CEFAZOLIN SODIUM-DEXTROSE 2-3 GM-% IV SOLR
2.0000 g | INTRAVENOUS | Status: AC
  Administered 2014-06-03: 2 g via INTRAVENOUS
  Filled 2014-06-02: qty 50

## 2014-06-02 NOTE — Progress Notes (Signed)
Message left with Darl PikesSusan at Dr Luiz BlareGraves office about getting medical clearance.  Message left for her to return call.

## 2014-06-03 ENCOUNTER — Encounter (HOSPITAL_COMMUNITY): Payer: Self-pay | Admitting: *Deleted

## 2014-06-03 ENCOUNTER — Inpatient Hospital Stay (HOSPITAL_COMMUNITY)
Admission: RE | Admit: 2014-06-03 | Discharge: 2014-06-05 | DRG: 470 | Disposition: A | Discharge status: Home Health | Payer: Medicare Other | Source: Ambulatory Visit | Attending: Orthopedic Surgery | Admitting: Orthopedic Surgery

## 2014-06-03 ENCOUNTER — Encounter (HOSPITAL_COMMUNITY): Payer: Medicare Other | Admitting: Vascular Surgery

## 2014-06-03 ENCOUNTER — Encounter (HOSPITAL_COMMUNITY)
Admission: RE | Disposition: A | Discharge status: Home Health | Payer: Self-pay | Source: Ambulatory Visit | Attending: Orthopedic Surgery

## 2014-06-03 ENCOUNTER — Inpatient Hospital Stay (HOSPITAL_COMMUNITY): Payer: Medicare Other | Admitting: Anesthesiology

## 2014-06-03 DIAGNOSIS — K219 Gastro-esophageal reflux disease without esophagitis: Secondary | ICD-10-CM | POA: Diagnosis present

## 2014-06-03 DIAGNOSIS — M1710 Unilateral primary osteoarthritis, unspecified knee: Secondary | ICD-10-CM | POA: Diagnosis present

## 2014-06-03 DIAGNOSIS — I10 Essential (primary) hypertension: Secondary | ICD-10-CM | POA: Diagnosis present

## 2014-06-03 DIAGNOSIS — E785 Hyperlipidemia, unspecified: Secondary | ICD-10-CM | POA: Diagnosis present

## 2014-06-03 DIAGNOSIS — M1711 Unilateral primary osteoarthritis, right knee: Secondary | ICD-10-CM | POA: Diagnosis present

## 2014-06-03 DIAGNOSIS — Z87891 Personal history of nicotine dependence: Secondary | ICD-10-CM

## 2014-06-03 DIAGNOSIS — M25569 Pain in unspecified knee: Secondary | ICD-10-CM | POA: Diagnosis present

## 2014-06-03 HISTORY — PX: TOTAL KNEE ARTHROPLASTY: SHX125

## 2014-06-03 SURGERY — ARTHROPLASTY, KNEE, TOTAL
Anesthesia: Regional | Site: Knee | Laterality: Right

## 2014-06-03 MED ORDER — ASPIRIN EC 325 MG PO TBEC
325.0000 mg | DELAYED_RELEASE_TABLET | Freq: Two times a day (BID) | ORAL | Status: DC
  Administered 2014-06-03 – 2014-06-05 (×4): 325 mg via ORAL
  Filled 2014-06-03 (×6): qty 1

## 2014-06-03 MED ORDER — SODIUM CHLORIDE 0.9 % IR SOLN
Status: DC | PRN
  Administered 2014-06-03: 3000 mL

## 2014-06-03 MED ORDER — LACTATED RINGERS IV SOLN
INTRAVENOUS | Status: DC
  Administered 2014-06-03: 12:00:00 via INTRAVENOUS
  Administered 2014-06-03: 50 mL/h via INTRAVENOUS

## 2014-06-03 MED ORDER — PHENYLEPHRINE HCL 10 MG/ML IJ SOLN
INTRAMUSCULAR | Status: DC | PRN
  Administered 2014-06-03: 40 ug via INTRAVENOUS

## 2014-06-03 MED ORDER — DILTIAZEM HCL ER 240 MG PO CP24
240.0000 mg | ORAL_CAPSULE | Freq: Every day | ORAL | Status: DC
  Administered 2014-06-04 – 2014-06-05 (×2): 240 mg via ORAL
  Filled 2014-06-03 (×2): qty 1

## 2014-06-03 MED ORDER — SODIUM CHLORIDE 0.9 % IV SOLN
INTRAVENOUS | Status: DC
  Administered 2014-06-03: 15:00:00 via INTRAVENOUS

## 2014-06-03 MED ORDER — TRANEXAMIC ACID 100 MG/ML IV SOLN: 1000.0000 mg | INTRAVENOUS | Status: DC

## 2014-06-03 MED ORDER — PROMETHAZINE HCL 25 MG/ML IJ SOLN: 6.2500 mg | Freq: Four times a day (QID) | INTRAMUSCULAR | Status: DC | PRN

## 2014-06-03 MED ORDER — HYDROMORPHONE HCL PF 1 MG/ML IJ SOLN
0.5000 mg | INTRAMUSCULAR | Status: DC | PRN
Start: 2014-06-03 — End: 2014-06-05
  Administered 2014-06-03: 1 mg via INTRAVENOUS
  Filled 2014-06-03: qty 1

## 2014-06-03 MED ORDER — MEPERIDINE HCL 25 MG/ML IJ SOLN: 6.2500 mg | INTRAMUSCULAR | Status: DC | PRN

## 2014-06-03 MED ORDER — CEFAZOLIN SODIUM-DEXTROSE 2-3 GM-% IV SOLR
2.0000 g | Freq: Four times a day (QID) | INTRAVENOUS | Status: AC
  Administered 2014-06-03 (×2): 2 g via INTRAVENOUS
  Filled 2014-06-03 (×3): qty 50

## 2014-06-03 MED ORDER — PROPOFOL 10 MG/ML IV BOLUS
INTRAVENOUS | Status: DC | PRN
  Administered 2014-06-03: 130 mg via INTRAVENOUS

## 2014-06-03 MED ORDER — ALUM & MAG HYDROXIDE-SIMETH 200-200-20 MG/5ML PO SUSP: 30.0000 mL | ORAL | Status: DC | PRN

## 2014-06-03 MED ORDER — DEXAMETHASONE SODIUM PHOSPHATE 10 MG/ML IJ SOLN
10.0000 mg | Freq: Three times a day (TID) | INTRAMUSCULAR | Status: AC
  Filled 2014-06-03 (×3): qty 1

## 2014-06-03 MED ORDER — FENTANYL CITRATE 0.05 MG/ML IJ SOLN
INTRAMUSCULAR | Status: AC
  Filled 2014-06-03: qty 2

## 2014-06-03 MED ORDER — ONDANSETRON HCL 4 MG/2ML IJ SOLN: 4.0000 mg | Freq: Four times a day (QID) | INTRAMUSCULAR | Status: DC | PRN

## 2014-06-03 MED ORDER — ONDANSETRON HCL 4 MG PO TABS: 4.0000 mg | ORAL_TABLET | Freq: Four times a day (QID) | ORAL | Status: DC | PRN

## 2014-06-03 MED ORDER — MIDAZOLAM HCL 2 MG/2ML IJ SOLN: 1.0000 mg | Freq: Once | INTRAMUSCULAR | Status: DC

## 2014-06-03 MED ORDER — ATORVASTATIN CALCIUM 10 MG PO TABS
10.0000 mg | ORAL_TABLET | Freq: Every day | ORAL | Status: DC
  Administered 2014-06-03 – 2014-06-04 (×2): 10 mg via ORAL
  Filled 2014-06-03 (×3): qty 1

## 2014-06-03 MED ORDER — METHOCARBAMOL 500 MG PO TABS: 500.0000 mg | ORAL_TABLET | Freq: Three times a day (TID) | ORAL | Status: DC | PRN

## 2014-06-03 MED ORDER — FENTANYL CITRATE 0.05 MG/ML IJ SOLN
25.0000 ug | INTRAMUSCULAR | Status: DC | PRN
  Administered 2014-06-03: 25 ug via INTRAVENOUS
  Administered 2014-06-03 (×2): 50 ug via INTRAVENOUS

## 2014-06-03 MED ORDER — ONDANSETRON HCL 4 MG/2ML IJ SOLN
INTRAMUSCULAR | Status: AC
  Filled 2014-06-03: qty 2

## 2014-06-03 MED ORDER — PANTOPRAZOLE SODIUM 40 MG PO TBEC
40.0000 mg | DELAYED_RELEASE_TABLET | Freq: Every day | ORAL | Status: DC
  Administered 2014-06-04 – 2014-06-05 (×2): 40 mg via ORAL
  Filled 2014-06-03 (×2): qty 1

## 2014-06-03 MED ORDER — CHLORHEXIDINE GLUCONATE 4 % EX LIQD
60.0000 mL | Freq: Once | CUTANEOUS | Status: DC
  Filled 2014-06-03: qty 60

## 2014-06-03 MED ORDER — METHOCARBAMOL 1000 MG/10ML IJ SOLN
500.0000 mg | Freq: Four times a day (QID) | INTRAMUSCULAR | Status: DC | PRN
Start: 2014-06-03 — End: 2014-06-05
  Filled 2014-06-03: qty 5

## 2014-06-03 MED ORDER — FENTANYL CITRATE 0.05 MG/ML IJ SOLN
INTRAMUSCULAR | Status: AC
  Administered 2014-06-03: 50 ug
  Filled 2014-06-03: qty 2

## 2014-06-03 MED ORDER — DIPHENHYDRAMINE HCL 12.5 MG/5ML PO ELIX
12.5000 mg | ORAL_SOLUTION | ORAL | Status: DC | PRN
  Administered 2014-06-03: 25 mg via ORAL
  Filled 2014-06-03: qty 10

## 2014-06-03 MED ORDER — DEXAMETHASONE 6 MG PO TABS
10.0000 mg | ORAL_TABLET | Freq: Three times a day (TID) | ORAL | Status: AC
  Administered 2014-06-03 – 2014-06-04 (×3): 10 mg via ORAL
  Filled 2014-06-03 (×4): qty 1

## 2014-06-03 MED ORDER — SIMVASTATIN 20 MG PO TABS: 20.0000 mg | ORAL_TABLET | Freq: Every day | ORAL | Status: DC

## 2014-06-03 MED ORDER — BISACODYL 5 MG PO TBEC: 5.0000 mg | DELAYED_RELEASE_TABLET | Freq: Every day | ORAL | Status: DC | PRN

## 2014-06-03 MED ORDER — 0.9 % SODIUM CHLORIDE (POUR BTL) OPTIME
TOPICAL | Status: DC | PRN
  Administered 2014-06-03: 1000 mL

## 2014-06-03 MED ORDER — POLYETHYLENE GLYCOL 3350 17 G PO PACK: 17.0000 g | PACK | Freq: Every day | ORAL | Status: DC | PRN

## 2014-06-03 MED ORDER — FENTANYL CITRATE 0.05 MG/ML IJ SOLN
INTRAMUSCULAR | Status: AC
  Administered 2014-06-03: 25 ug via INTRAVENOUS
  Filled 2014-06-03: qty 2

## 2014-06-03 MED ORDER — OXYCODONE-ACETAMINOPHEN 5-325 MG PO TABS: 1.0000 | ORAL_TABLET | Freq: Four times a day (QID) | ORAL | Status: DC | PRN

## 2014-06-03 MED ORDER — FENTANYL CITRATE 0.05 MG/ML IJ SOLN: 50.0000 ug | Freq: Once | INTRAMUSCULAR | Status: DC

## 2014-06-03 MED ORDER — PROMETHAZINE HCL 25 MG/ML IJ SOLN: 6.2500 mg | INTRAMUSCULAR | Status: DC | PRN

## 2014-06-03 MED ORDER — BUPIVACAINE-EPINEPHRINE (PF) 0.5% -1:200000 IJ SOLN
INTRAMUSCULAR | Status: DC | PRN
  Administered 2014-06-03: 30 mL via PERINEURAL

## 2014-06-03 MED ORDER — BUPIVACAINE LIPOSOME 1.3 % IJ SUSP
20.0000 mL | Freq: Once | INTRAMUSCULAR | Status: DC
  Filled 2014-06-03: qty 20

## 2014-06-03 MED ORDER — ACETAMINOPHEN 650 MG RE SUPP: 650.0000 mg | Freq: Four times a day (QID) | RECTAL | Status: DC | PRN

## 2014-06-03 MED ORDER — KETOROLAC TROMETHAMINE 15 MG/ML IJ SOLN
7.5000 mg | Freq: Four times a day (QID) | INTRAMUSCULAR | Status: AC
  Administered 2014-06-03 – 2014-06-04 (×3): 7.5 mg via INTRAVENOUS
  Filled 2014-06-03 (×2): qty 1

## 2014-06-03 MED ORDER — ACETAMINOPHEN 325 MG PO TABS: 650.0000 mg | ORAL_TABLET | Freq: Four times a day (QID) | ORAL | Status: DC | PRN

## 2014-06-03 MED ORDER — ASPIRIN EC 325 MG PO TBEC: 325.0000 mg | DELAYED_RELEASE_TABLET | Freq: Two times a day (BID) | ORAL | Status: DC

## 2014-06-03 MED ORDER — OXYCODONE-ACETAMINOPHEN 5-325 MG PO TABS
1.0000 | ORAL_TABLET | ORAL | Status: DC | PRN
  Administered 2014-06-04: 2 via ORAL
  Administered 2014-06-04: 1 via ORAL
  Administered 2014-06-04: 2 via ORAL
  Filled 2014-06-03: qty 2
  Filled 2014-06-03: qty 1
  Filled 2014-06-03 (×2): qty 2
  Filled 2014-06-03: qty 1

## 2014-06-03 MED ORDER — DICYCLOMINE HCL 10 MG PO CAPS
10.0000 mg | ORAL_CAPSULE | Freq: Every day | ORAL | Status: DC
  Administered 2014-06-05: 10 mg via ORAL
  Filled 2014-06-03: qty 1

## 2014-06-03 MED ORDER — SODIUM CHLORIDE 0.9 % IJ SOLN
INTRAMUSCULAR | Status: DC | PRN
  Administered 2014-06-03: 40 mL via INTRAVENOUS

## 2014-06-03 MED ORDER — MIDAZOLAM HCL 2 MG/2ML IJ SOLN
INTRAMUSCULAR | Status: AC
  Administered 2014-06-03: 1 mg
  Filled 2014-06-03: qty 2

## 2014-06-03 MED ORDER — LIDOCAINE HCL (CARDIAC) 20 MG/ML IV SOLN
INTRAVENOUS | Status: DC | PRN
  Administered 2014-06-03: 60 mg via INTRAVENOUS

## 2014-06-03 MED ORDER — DOCUSATE SODIUM 100 MG PO CAPS
100.0000 mg | ORAL_CAPSULE | Freq: Two times a day (BID) | ORAL | Status: DC
  Administered 2014-06-03 – 2014-06-05 (×4): 100 mg via ORAL
  Filled 2014-06-03 (×5): qty 1

## 2014-06-03 MED ORDER — METOPROLOL TARTRATE 25 MG PO TABS
25.0000 mg | ORAL_TABLET | Freq: Every day | ORAL | Status: DC
  Administered 2014-06-04 – 2014-06-05 (×2): 25 mg via ORAL
  Filled 2014-06-03 (×2): qty 1

## 2014-06-03 MED ORDER — FENTANYL CITRATE 0.05 MG/ML IJ SOLN
INTRAMUSCULAR | Status: DC | PRN
  Administered 2014-06-03: 50 ug via INTRAVENOUS
  Administered 2014-06-03: 100 ug via INTRAVENOUS
  Administered 2014-06-03: 50 ug via INTRAVENOUS

## 2014-06-03 MED ORDER — METHOCARBAMOL 500 MG PO TABS
500.0000 mg | ORAL_TABLET | Freq: Four times a day (QID) | ORAL | Status: DC | PRN
  Administered 2014-06-04: 500 mg via ORAL
  Filled 2014-06-03: qty 1

## 2014-06-03 MED ORDER — FENTANYL CITRATE 0.05 MG/ML IJ SOLN
INTRAMUSCULAR | Status: AC
  Filled 2014-06-03: qty 5

## 2014-06-03 SURGICAL SUPPLY — 68 items
APL SKNCLS STERI-STRIP NONHPOA (GAUZE/BANDAGES/DRESSINGS) ×1
BANDAGE ESMARK 6X9 LF (GAUZE/BANDAGES/DRESSINGS) ×1 IMPLANT
BENZOIN TINCTURE PRP APPL 2/3 (GAUZE/BANDAGES/DRESSINGS) ×3 IMPLANT
BLADE SAGITTAL 25.0X1.19X90 (BLADE) ×2 IMPLANT
BLADE SAGITTAL 25.0X1.19X90MM (BLADE) ×1
BLADE SAW SAG 90X13X1.27 (BLADE) ×3 IMPLANT
BNDG CMPR 9X6 STRL LF SNTH (GAUZE/BANDAGES/DRESSINGS) ×1
BNDG ESMARK 6X9 LF (GAUZE/BANDAGES/DRESSINGS) ×3
BOWL SMART MIX CTS (DISPOSABLE) ×3 IMPLANT
CAPT RP KNEE ×2 IMPLANT
CEMENT HV SMART SET (Cement) ×6 IMPLANT
CLOSURE WOUND 1/2 X4 (GAUZE/BANDAGES/DRESSINGS) ×1
COVER SURGICAL LIGHT HANDLE (MISCELLANEOUS) ×3 IMPLANT
CUFF TOURNIQUET SINGLE 34IN LL (TOURNIQUET CUFF) ×3 IMPLANT
CUFF TOURNIQUET SINGLE 44IN (TOURNIQUET CUFF) IMPLANT
DRAPE EXTREMITY T 121X128X90 (DRAPE) ×3 IMPLANT
DRAPE U-SHAPE 47X51 STRL (DRAPES) ×3 IMPLANT
DRSG PAD ABDOMINAL 8X10 ST (GAUZE/BANDAGES/DRESSINGS) ×3 IMPLANT
DURAPREP 26ML APPLICATOR (WOUND CARE) ×3 IMPLANT
ELECT REM PT RETURN 9FT ADLT (ELECTROSURGICAL) ×3
ELECTRODE REM PT RTRN 9FT ADLT (ELECTROSURGICAL) ×1 IMPLANT
EVACUATOR 1/8 PVC DRAIN (DRAIN) ×3 IMPLANT
FACESHIELD WRAPAROUND (MASK) ×3 IMPLANT
FACESHIELD WRAPAROUND OR TEAM (MASK) ×1 IMPLANT
GAUZE XEROFORM 5X9 LF (GAUZE/BANDAGES/DRESSINGS) ×3 IMPLANT
GLOVE BIOGEL PI IND STRL 8 (GLOVE) ×2 IMPLANT
GLOVE BIOGEL PI INDICATOR 8 (GLOVE) ×4
GLOVE ECLIPSE 7.5 STRL STRAW (GLOVE) ×6 IMPLANT
GOWN STRL REUS W/ TWL LRG LVL3 (GOWN DISPOSABLE) ×1 IMPLANT
GOWN STRL REUS W/ TWL XL LVL3 (GOWN DISPOSABLE) ×2 IMPLANT
GOWN STRL REUS W/TWL LRG LVL3 (GOWN DISPOSABLE) ×3
GOWN STRL REUS W/TWL XL LVL3 (GOWN DISPOSABLE) ×6
HANDPIECE INTERPULSE COAX TIP (DISPOSABLE) ×3
HOOD PEEL AWAY FACE SHEILD DIS (HOOD) ×9 IMPLANT
IMMOBILIZER KNEE 20 (SOFTGOODS) IMPLANT
IMMOBILIZER KNEE 22 (SOFTGOODS) ×2 IMPLANT
IMMOBILIZER KNEE 22 UNIV (SOFTGOODS) ×3 IMPLANT
KIT BASIN OR (CUSTOM PROCEDURE TRAY) ×3 IMPLANT
KIT ROOM TURNOVER OR (KITS) ×3 IMPLANT
MANIFOLD NEPTUNE II (INSTRUMENTS) ×3 IMPLANT
NDL HYPO 25GX1X1/2 BEV (NEEDLE) IMPLANT
NEEDLE 22X1 1/2 (OR ONLY) (NEEDLE) ×2 IMPLANT
NEEDLE HYPO 25GX1X1/2 BEV (NEEDLE) IMPLANT
NS IRRIG 1000ML POUR BTL (IV SOLUTION) ×3 IMPLANT
PACK TOTAL JOINT (CUSTOM PROCEDURE TRAY) ×3 IMPLANT
PAD ARMBOARD 7.5X6 YLW CONV (MISCELLANEOUS) ×6 IMPLANT
PAD CAST 4YDX4 CTTN HI CHSV (CAST SUPPLIES) ×1 IMPLANT
PADDING CAST COTTON 4X4 STRL (CAST SUPPLIES) ×3
PADDING CAST COTTON 6X4 STRL (CAST SUPPLIES) ×2 IMPLANT
SET HNDPC FAN SPRY TIP SCT (DISPOSABLE) ×1 IMPLANT
SPONGE GAUZE 4X4 12PLY (GAUZE/BANDAGES/DRESSINGS) ×3 IMPLANT
SPONGE GAUZE 4X4 12PLY STER LF (GAUZE/BANDAGES/DRESSINGS) ×2 IMPLANT
STAPLER VISISTAT 35W (STAPLE) IMPLANT
STRIP CLOSURE SKIN 1/2X4 (GAUZE/BANDAGES/DRESSINGS) ×2 IMPLANT
SUCTION FRAZIER TIP 10 FR DISP (SUCTIONS) ×3 IMPLANT
SUT MNCRL AB 3-0 PS2 18 (SUTURE) IMPLANT
SUT VIC AB 0 CTB1 27 (SUTURE) ×6 IMPLANT
SUT VIC AB 1 CT1 27 (SUTURE) ×6
SUT VIC AB 1 CT1 27XBRD ANBCTR (SUTURE) ×2 IMPLANT
SUT VIC AB 2-0 CTB1 (SUTURE) ×6 IMPLANT
SYR 50ML LL SCALE MARK (SYRINGE) ×1 IMPLANT
SYR 50ML SLIP (SYRINGE) ×2 IMPLANT
SYR CONTROL 10ML LL (SYRINGE) IMPLANT
TOWEL OR 17X24 6PK STRL BLUE (TOWEL DISPOSABLE) ×3 IMPLANT
TOWEL OR 17X26 10 PK STRL BLUE (TOWEL DISPOSABLE) ×3 IMPLANT
TRAY FOLEY CATH 14FR (SET/KITS/TRAYS/PACK) ×2 IMPLANT
TRAY FOLEY CATH 16FRSI W/METER (SET/KITS/TRAYS/PACK) ×1 IMPLANT
WATER STERILE IRR 1000ML POUR (IV SOLUTION) ×2 IMPLANT

## 2014-06-03 NOTE — Op Note (Signed)
NAMBeverely Low:  [name], [name]                  ACCOUNT NO.:  [id number]  MEDICAL RECORD NO.:  [id number]  LOCATION:  [id number]                        FACILITY:  [facility]  PHYSICIAN:  Harvie JuniorJohn L. Graves, M.D.   DATE OF BIRTH:  1934-10-21  DATE OF PROCEDURE:  06/03/2014 DATE OF DISCHARGE:                              OPERATIVE REPORT   PREOPERATIVE DIAGNOSIS:  End-stage degenerative joint disease, bilateral knees.  POSTOPERATIVE DIAGNOSIS:  End-stage degenerative joint disease, bilateral knees.  PROCEDURE:  Right total knee replacement with a Sigma system, size 3 femur, size 3 tibia, 15 mm bridging bearing, and a 35 mm all polyethylene patella.  SURGEON:  Harvie JuniorJohn L. Graves, M.D.  ASSISTANT:  Marshia LyJames Bethune, PA.  ANESTHESIA:  General.  BRIEF HISTORY:  Ms. [name] is a 80 year old female with a long history of having significant complaints of bilateral knee pain, right greater than left.  She complained of severe pain.  She had an x-ray showing bone-on- bone changes.  She failed all conservative care including injection therapy, activity modification, physical therapy.  After failure of all conservative care, she was taken to the operating room for right total knee replacement because of night pain and light activity pain.  PROCEDURE:  The patient was taken to the operating room.  After adequate anesthesia was obtained with general anesthetic, the patient was placed supine on the operating table.  Right leg was prepped and draped in usual sterile fashion.  Following this, a midline incision was made in the subcutaneous tissue down to the level of the extensor mechanism. Medial parapatellar arthrotomy was undertaken.  Following this, medial and lateral meniscus were removed, retropatellar fat pad, synovium in the anterior aspect of the femur, and anterior and posterior cruciates. Attention was then turned to the tibia, it was cut perpendicular to the long axis.  Attention was turned to the femur.   Intramedullary hole was drilled and the femur was cut with a 5-degree valgus inclination with 10 degrees of distal femoral cut.  Once this was done, the femur was sized to a 3, and anterior and posterior cuts were made, and chamfers and box, and attention turned to the tibia, sized to a 3, was drilled and keeled. Trials were put in place with a 12.5, which gave her pretty good stability, but felt in mid flexion there was a little bit of laxity and 15, which was perfect.  Attention was then turned to the patella, which was cut down to a level of 13 mm.  Lugs were drilled for 35, and this was placed.  Trial was put through a range of motion.  Excellent stability and range of motion was achieved.  All trials were removed. The knee was copiously and thoroughly lavaged with pulsatile lavage, irrigation, and suctioned dry, and following this, attention was turned towards cementing in the final components; size 3 femur, size 3 tibia, and 12.5 mm bridging bearing trial, and a 35-mm all polyethylene patella was held with a clamp.  All excess bone cement was removed and the cement was allowed to completely harden.  Once this was done, we trialed a 12.5, again trialed 15, 15 felt a little better to me and  it was easy to get into full extension, had better flexion/stability, so 15 was opened and trialed and placed.  The tourniquet was let down.  All bleeding was controlled with electrocautery prior to placing the final poly.  The knee was then irrigated, suctioned dry.  Hemovac drain was placed.  Medial parapatellar arthrotomy was closed with 1- Vicryl running, skin with 0 and 2-0 Vicryl, and then skin staples. Sterile compressive dressing was applied.  The patient was taken to the recovery room and she was noted to be in satisfactory condition. Estimated blood loss for the procedure was none.     Harvie JuniorJohn L. Graves, M.D.     Ranae PlumberJLG/MEDQ  D:  06/03/2014  T:  06/03/2014  Job:  [id number]

## 2014-06-03 NOTE — Discharge Instructions (Signed)
Total Knee Replacement °Care After °Refer to this sheet in the next few weeks. These instructions provide you with information on caring for yourself after your procedure. Your caregiver also may give you specific instructions. Your treatment has been planned according to the most current medical practices, but problems sometimes occur. Call your caregiver if you have any problems or questions after your procedure. °HOME CARE INSTRUCTIONS  °· See a physical therapist as directed by your caregiver. °· Take over-the-counter or prescription medicines for pain, discomfort, or fever only as directed by your caregiver. °· Avoid lifting or driving until you are instructed otherwise. °· If you have been sent home with a continuous passive motion machine, use it as directed by your caregiver. °SEEK MEDICAL CARE IF: °· You have difficulty breathing. °· Your wound is red, swollen, or has become increasingly painful. °· You have pus draining from your wound. °· You have a bad smell coming from your wound. °· You have persistent bleeding from your wound. °· Your wound breaks open after sutures (stitches) or staples have been removed. °SEEK IMMEDIATE MEDICAL CARE IF:  °· You have a fever. °· You have a rash. °· You have pain or swelling in your calf or thigh. °· You have shortness of breath or chest pain. °· Your range of motion in your knee is decreasing rather than increasing. °MAKE SURE YOU:  °· Understand these instructions. °· Will watch your condition. °· Will get help right away if you are not doing well or get worse. °Document Released: 06/21/2005 Document Revised: 06/02/2012 Document Reviewed: 01/21/2012 °ExitCare® Patient Information ©2015 [employer]. This information is not intended to replace advice given to you by your health care provider. Make sure you discuss any questions you have with your health care provider. ° °

## 2014-06-03 NOTE — H&P (Signed)
TOTAL KNEE ADMISSION H&P  Patient is being admitted for right total knee arthroplasty.  Subjective:  Chief Complaint:right knee pain.  HPI: [name], 80 y.o. female, has a history of pain and functional disability in the right knee due to arthritis and has failed non-surgical conservative treatments for greater than 12 weeks to includeNSAID's and/or analgesics, corticosteriod injections, viscosupplementation injections, use of assistive devices, weight reduction as appropriate and activity modification.  Onset of symptoms was gradual, starting 5 years ago with gradually worsening course since that time. The patient noted no past surgery on the right knee(s).  Patient currently rates pain in the right knee(s) at 8 out of 10 with activity. Patient has night pain, worsening of pain with activity and weight bearing, pain that interferes with activities of daily living, pain with passive range of motion, crepitus and joint swelling.  Patient has evidence of subchondral sclerosis, joint subluxation and joint space narrowing by imaging studies. This patient has had failure of conservative care. There is no active infection.  There are no active problems to display for this patient.  Past Medical History  Diagnosis Date  . Family history of anesthesia complication     Daughter; takes a long time to wake up  . Hypertension   . Shortness of breath     while walking  . Frequency of urination     at time  . GERD (gastroesophageal reflux disease)   . Arthritis   . Hyperlipemia     Past Surgical History  Procedure Laterality Date  . Mitral valve repair    . Abdominal hysterectomy    . Eye surgery Bilateral     cataract removed; lens implant    Prescriptions prior to admission  Medication Sig Dispense Refill  . acetaminophen (TYLENOL) 650 MG CR tablet Take 650 mg by mouth every 8 (eight) hours as needed for pain.      . Ascorbic Acid (VITAMIN C PO) Take 1 tablet by mouth daily.      Marland Kitchen  dicyclomine (BENTYL) 10 MG capsule Take 10 mg by mouth daily before lunch.      . diltiazem (DILACOR XR) 240 MG 24 hr capsule Take 240 mg by mouth daily.      . metoprolol tartrate (LOPRESSOR) 25 MG tablet Take 25 mg by mouth daily.      Marland Kitchen omeprazole (PRILOSEC) 20 MG capsule Take 20 mg by mouth daily.      . Oxymetazoline HCl (AFRIN NASAL SPRAY NA) Place 1 spray into both nostrils daily as needed (congestion).      Vladimir Faster Glycol-Propyl Glycol (SYSTANE OP) Place 2 drops into both eyes 2 (two) times daily as needed (dry eyes).      . simvastatin (ZOCOR) 20 MG tablet Take 20 mg by mouth daily.       No Known Allergies  History  Substance Use Topics  . Smoking status: Former Research scientist (life sciences)  . Smokeless tobacco: Not on file  . Alcohol Use: No    History reviewed. No pertinent family history.   ROS ROS: I have reviewed the patient's review of systems thoroughly and there are no positive responses as relates to the HPI.  Objective:  Physical Exam  Vital signs in last 24 hours: Temp:  [98.4 F (36.9 C)] 98.4 F (36.9 C) (06/19 0922) Pulse Rate:  [60-91] 63 (06/19 0939) Resp:  [18-27] 27 (06/19 0939) BP: (156-177)/(84-98) 177/84 mmHg (06/19 0939) SpO2:  [99 %-100 %] 100 % (06/19 0939) Well-developed well-nourished patient  in no acute distress. Alert and oriented x3 HEENT:within normal limits Cardiac: Regular rate and rhythm Pulmonary: Lungs clear to auscultation Abdomen: Soft and nontender.  Normal active bowel sounds  Musculoskeletal: (R Knee:  Painful rom +med jt line tender/ -instability/[id number]  Labs: Recent Results (from the past 2160 hour(s))  SURGICAL PCR SCREEN     Status: None   Collection Time    05/25/14  3:23 PM      Result Value Ref Range   MRSA, PCR NEGATIVE  NEGATIVE   Staphylococcus aureus NEGATIVE  NEGATIVE   Comment:            The Xpert SA Assay (FDA     approved for NASAL specimens     in patients over 89 years of age),     is one component of     a  comprehensive surveillance     program.  Test performance has     been validated by [employer] for patients greater     than or equal to 16 year old.     It is not intended     to diagnose infection nor to     guide or monitor treatment.  APTT     Status: None   Collection Time    05/25/14  3:24 PM      Result Value Ref Range   aPTT 31  24 - 37 seconds  CBC WITH DIFFERENTIAL     Status: None   Collection Time    05/25/14  3:24 PM      Result Value Ref Range   WBC 5.5  4.0 - 10.5 K/uL   RBC 4.86  3.87 - 5.11 MIL/uL   Hemoglobin 13.7  12.0 - 15.0 g/dL   HCT 41.8  36.0 - 46.0 %   MCV 86.0  78.0 - 100.0 fL   MCH 28.2  26.0 - 34.0 pg   MCHC 32.8  30.0 - 36.0 g/dL   RDW 14.2  11.5 - 15.5 %   Platelets 200  150 - 400 K/uL   Neutrophils Relative % 59  43 - 77 %   Neutro Abs 3.3  1.7 - 7.7 K/uL   Lymphocytes Relative 32  12 - 46 %   Lymphs Abs 1.8  0.7 - 4.0 K/uL   Monocytes Relative 8  3 - 12 %   Monocytes Absolute 0.4  0.1 - 1.0 K/uL   Eosinophils Relative 1  0 - 5 %   Eosinophils Absolute 0.0  0.0 - 0.7 K/uL   Basophils Relative 0  0 - 1 %   Basophils Absolute 0.0  0.0 - 0.1 K/uL  COMPREHENSIVE METABOLIC PANEL     Status: Abnormal   Collection Time    05/25/14  3:24 PM      Result Value Ref Range   Sodium 140  137 - 147 mEq/L   Potassium 3.5 (*) 3.7 - 5.3 mEq/L   Chloride 100  96 - 112 mEq/L   CO2 23  19 - 32 mEq/L   Glucose, Bld 88  70 - 99 mg/dL   BUN 16  6 - 23 mg/dL   Creatinine, Ser 0.96  0.50 - 1.10 mg/dL   Calcium 9.5  8.4 - 10.5 mg/dL   Total Protein 7.6  6.0 - 8.3 g/dL   Albumin 3.8  3.5 - 5.2 g/dL   AST 17  0 - 37 U/L   ALT 6  0 - 35 U/L  Alkaline Phosphatase 99  39 - 117 U/L   Total Bilirubin 0.3  0.3 - 1.2 mg/dL   GFR calc non Af Amer 56 (*) >90 mL/min   GFR calc Af Amer 64 (*) >90 mL/min   Comment: (NOTE)     The eGFR has been calculated using the CKD EPI equation.     This calculation has not been validated in all clinical situations.      eGFR's persistently <90 mL/min signify possible Chronic Kidney     Disease.  PROTIME-INR     Status: None   Collection Time    05/25/14  3:24 PM      Result Value Ref Range   Prothrombin Time 13.7  11.6 - 15.2 seconds   INR 1.07  0.00 - 1.49  URINALYSIS, ROUTINE W REFLEX MICROSCOPIC     Status: Abnormal   Collection Time    05/25/14  3:24 PM      Result Value Ref Range   Color, Urine YELLOW  YELLOW   APPearance HAZY (*) CLEAR   Specific Gravity, Urine 1.033 (*) 1.005 - 1.030   pH 5.5  5.0 - 8.0   Glucose, UA NEGATIVE  NEGATIVE mg/dL   Hgb urine dipstick NEGATIVE  NEGATIVE   Bilirubin Urine NEGATIVE  NEGATIVE   Ketones, ur 15 (*) NEGATIVE mg/dL   Protein, ur NEGATIVE  NEGATIVE mg/dL   Urobilinogen, UA 0.2  0.0 - 1.0 mg/dL   Nitrite NEGATIVE  NEGATIVE   Leukocytes, UA NEGATIVE  NEGATIVE   Comment: MICROSCOPIC NOT DONE ON URINES WITH NEGATIVE PROTEIN, BLOOD, LEUKOCYTES, NITRITE, OR GLUCOSE <1000 mg/dL.  TYPE AND SCREEN     Status: None   Collection Time    05/25/14  3:34 PM      Result Value Ref Range   ABO/RH(D) O POS     Antibody Screen NEG     Sample Expiration 06/08/2014       There is no weight on file to calculate BMI.   Imaging Review Plain radiographs demonstrate severe degenerative joint disease of the right knee(s). The overall alignment ismild varus. The bone quality appears to be good for age and reported activity level.  Assessment/Plan:  End stage arthritis, right knee   The patient history, physical examination, clinical judgment of the Carleah Yablonski and imaging studies are consistent with end stage degenerative joint disease of the right knee(s) and total knee arthroplasty is deemed medically necessary. The treatment options including medical management, injection therapy arthroscopy and arthroplasty were discussed at length. The risks and benefits of total knee arthroplasty were presented and reviewed. The risks due to aseptic loosening, infection, stiffness,  patella tracking problems, thromboembolic complications and other imponderables were discussed. The patient acknowledged the explanation, agreed to proceed with the plan and consent was signed. Patient is being admitted for inpatient treatment for surgery, pain control, PT, OT, prophylactic antibiotics, VTE prophylaxis, progressive ambulation and ADL's and discharge planning. The patient is planning to be discharged home with home health services

## 2014-06-03 NOTE — Care Management Utilization Note (Signed)
Utilization review completed. Vance PeperSusan Brady, RN BSN case manager

## 2014-06-03 NOTE — Plan of Care (Signed)
Problem: Consults Goal: Diagnosis- Total Joint Replacement Primary Total Knee Right     

## 2014-06-03 NOTE — Anesthesia Procedure Notes (Addendum)
Anesthesia Regional Block:  Femoral nerve block  Pre-Anesthetic Checklist: ,, timeout performed, Correct Patient, Correct Site, Correct Laterality, Correct Procedure, Correct Position, site marked, Risks and benefits discussed,  Surgical consent,  Pre-op evaluation,  At surgeon's request and post-op pain management  Laterality: Left  Prep: chloraprep       Needles:  Injection technique: Single-shot  Needle Type: Stimiplex          Additional Needles:  Procedures: ultrasound guided (picture in chart) and nerve stimulator Femoral nerve block Narrative:  Start time: 06/03/2014 9:40 AM End time: 06/03/2014 9:55 AM Injection made incrementally with aspirations every 5 mL.  Performed by: Personally   Additional Notes: Risks, benefits and alternative to block explained extensively.  Patient tolerated procedure well, without complications.   Procedure Name: LMA Insertion Date/Time: 06/03/2014 10:46 AM Performed by: Marena ChancyBECKNER, ZACHARY S Pre-anesthesia Checklist: Patient identified, Patient being monitored, Emergency Drugs available, Timeout performed and Suction available Patient Re-evaluated:Patient Re-evaluated prior to inductionOxygen Delivery Method: Circle system utilized Preoxygenation: Pre-oxygenation with 100% oxygen Intubation Type: IV induction LMA: LMA inserted LMA Size: 3.0 Placement Confirmation: breath sounds checked- equal and bilateral and positive ETCO2 Tube secured with: Tape Dental Injury: Teeth and Oropharynx as per pre-operative assessment

## 2014-06-03 NOTE — Anesthesia Preprocedure Evaluation (Addendum)
Anesthesia Evaluation  Patient identified by MRN, date of birth, ID band Patient awake    Reviewed: Allergy & Precautions, H&P , Patient's Chart, lab work & pertinent test results, reviewed documented beta blocker date and time   History of Anesthesia Complications (+) Family history of anesthesia reactionNegative for: history of anesthetic complications  Airway Mallampati: II TM Distance: >3 FB Neck ROM: full    Dental   Pulmonary shortness of breath and with exertion, former smoker,  breath sounds clear to auscultation        Cardiovascular Exercise Tolerance: Good hypertension, Rhythm:regular Rate:Normal     Neuro/Psych negative psych ROS   GI/Hepatic GERD-  Controlled,  Endo/Other    Renal/GU      Musculoskeletal   Abdominal   Peds  Hematology   Anesthesia Other Findings S/p mitral valve annuloplasty- mild septal hypokinesis  Reproductive/Obstetrics                        Anesthesia Physical Anesthesia Plan  ASA: II  Anesthesia Plan: General LMA and Regional   Post-op Pain Management:    Induction:   Airway Management Planned:   Additional Equipment:   Intra-op Plan:   Post-operative Plan:   Informed Consent: I have reviewed the patients History and Physical, chart, labs and discussed the procedure including the risks, benefits and alternatives for the proposed anesthesia with the patient or authorized representative who has indicated his/her understanding and acceptance.   Dental Advisory Given  Plan Discussed with: CRNA, Surgeon and Anesthesiologist  Anesthesia Plan Comments:        Additional records received from Dr. Roseanne KaufmanKadakia's office. There was no recent stress test. The last echo done was on 10/22/11 and showed: Normal LV size and mild septal hypokinesis with mild systolic and diastolic dysfunction. EF 55%. Mild LVH. Normal RV size and systolic function. Normal LA and  RA size. MV with annuloplasty ring, minimal MR and mild MS. Normal TV with mild TR. Normal AV. Mild PI. Mildly elevated pulmonary systolic pressure. MV area 1.29-1.76 cm2. No intracardia mass. No pericardial effusion.  Anesthesia Quick Evaluation

## 2014-06-03 NOTE — Brief Op Note (Signed)
06/03/2014  2:20 PM  PATIENT:  [name]  80 y.o. female  PRE-OPERATIVE DIAGNOSIS:  DEGENERATIVE JOINT DISEASE  POST-OPERATIVE DIAGNOSIS:  DEGENERATIVE JOINT DISEASE  PROCEDURE:  Procedure(s): RIGHT TOTAL KNEE ARTHROPLASTY  (Right)  SURGEON:  Surgeon(s) and Role:    * Harvie JuniorJohn L Graves, MD - Primary  PHYSICIAN ASSISTANT:   ASSISTANTS: bethune   ANESTHESIA:   general  EBL:  Total I/O In: 1325 [I.V.:1325] Out: 550 [Urine:400; Drains:150]  BLOOD ADMINISTERED:none  DRAINS: (r knee) Hemovact drain(s) in the r knee with  Suction Open   LOCAL MEDICATIONS USED:  OTHER experel  SPECIMEN:  No Specimen  DISPOSITION OF SPECIMEN:  N/A  COUNTS:  YES  TOURNIQUET:   Total Tourniquet Time Documented: Thigh (Right) - 57 minutes Total: Thigh (Right) - 57 minutes   DICTATION: .Other Dictation: Dictation Number [id number]  PLAN OF CARE: Admit to inpatient   PATIENT DISPOSITION:  PACU - hemodynamically stable.   Delay start of Pharmacological VTE agent (>24hrs) due to surgical blood loss or risk of bleeding: no

## 2014-06-03 NOTE — Anesthesia Postprocedure Evaluation (Signed)
  Anesthesia Post Note  Patient: [name]  Procedure(s) Performed: Procedure(s) (LRB): RIGHT TOTAL KNEE ARTHROPLASTY  (Right)  Anesthesia type: GA  Patient location: PACU  Post pain: Pain level controlled  Post assessment: Post-op Vital signs reviewed  Last Vitals:  Filed Vitals:   06/03/14 1300  BP:   Pulse:   Temp:   Resp: 12    Post vital signs: Reviewed  Level of consciousness: sedated  Complications: No apparent anesthesia complications

## 2014-06-03 NOTE — Progress Notes (Signed)
Orthopedic Tech Progress Note Patient Details:  Janet GrumblesSusan A [name] 1934/03/21 [id number] Applied CPM to RLE. Left Footsie Roll with pt's nurse. CPM Right Knee CPM Right Knee: On Right Knee Flexion (Degrees): 60 Right Knee Extension (Degrees): 0   Lesle ChrisGilliland, Roy L 06/03/2014, 1:52 PM

## 2014-06-03 NOTE — Care Management Note (Signed)
CARE MANAGEMENT NOTE 06/03/2014  Patient:  [name],[name]   Account Number:  [id number]  Date Initiated:  06/03/2014  Documentation initiated by:  Vance PeperBRADY,Shonita  Subjective/Objective Assessment:   80yr old female s/p right total knee arthroplasty.     Action/Plan:   PT/OT eval  Case manager will continue to monitor.   Anticipated DC Date:     Anticipated DC Plan:  HOME W HOME HEALTH SERVICES      DC Planning Services  CM consult      Choice offered to / List presented to:             Status of service:  In process, will continue to follow

## 2014-06-03 NOTE — Transfer of Care (Signed)
Immediate Anesthesia Transfer of Care Note  Patient: Janet GrumblesSusan A Quant  Procedure(s) Performed: Procedure(s): RIGHT TOTAL KNEE ARTHROPLASTY  (Right)  Patient Location: PACU  Anesthesia Type:General  Level of Consciousness: awake, alert  and oriented  Airway & Oxygen Therapy: Patient Spontanous Breathing and Patient connected to nasal cannula oxygen  Post-op Assessment: Report given to PACU RN and Post -op Vital signs reviewed and stable  Post vital signs: Reviewed and stable  Complications: No apparent anesthesia complications

## 2014-06-03 NOTE — Evaluation (Signed)
Physical Therapy Evaluation Patient Details Name: [name] MRN: [id number] DOB: Aug 12, 1934 Today's Date: 06/03/2014   History of Present Illness  80 [facility].o. [facility] for elective R TKA.  She has significant PMhx of HTN, and mitral valve repair.   Clinical Impression  Pt is POD #0 s/p R TKA and is moving with min/mod assist with RW.  She will likely progress well enough to go home with assist and HHPT f/u at discharge.  Next therapist will need to further assess home environment as she had a hard time staying awake for hx questions.   PT to follow acutely for deficits listed below.       Follow Up Recommendations Home health PT;Supervision for mobility/OOB    Equipment Recommendations  Other (comment) (pt had RW delivered by DME company during our session)    Recommendations for Other Services   NA    Precautions / Restrictions Precautions Precautions: Knee Required Braces or Orthoses: Knee Immobilizer - Right Knee Immobilizer - Right: Discontinue once straight leg raise with < 10 degree lag Restrictions Weight Bearing Restrictions: Yes RLE Weight Bearing: Weight bearing as tolerated      Mobility  Bed Mobility Overal bed mobility: Needs Assistance Bed Mobility: Supine to Sit     Supine to sit: Min assist     General bed mobility comments: min assist to help progress right leg over EOB and give her someone to pull up against to get to sitting.   Transfers Overall transfer level: Needs assistance Equipment used: Rolling walker (2 wheeled) Transfers: Sit to/from UGI CorporationStand;Stand Pivot Transfers Sit to Stand: Min assist Stand pivot transfers: Mod assist       General transfer comment: Min assist to support trunk to get to standing with RW. Verbal cues for safe hand placement and WB status. Mod assist to pivot with RW to recliner chair (2-3 pivotal steps) mod assist to support trunk when stepping with left leg due to instability on right leg even with  KI donned.                      Balance Overall balance assessment: Needs assistance Sitting-balance support: Feet supported;No upper extremity supported Sitting balance-Leahy Scale: Good     Standing balance support: Bilateral upper extremity supported Standing balance-Leahy Scale: Poor                               Pertinent Vitals/Pain See vitals flow sheet.     Home Living Family/patient expects to be discharged to:: Private residence                 Additional Comments: pt could not keep her eyes open long enough to answer my history questions, will need to get later     Prior Function                    Extremity/Trunk Assessment   Upper Extremity Assessment: Overall WFL for tasks assessed           Lower Extremity Assessment: RLE deficits/detail RLE Deficits / Details: normal post op pain and weakness.  Pt is able to move ankle 3/4, knee 2-/5, hip 2/5    Cervical / Trunk Assessment: Normal  Communication   Communication: No difficulties  Cognition Arousal/Alertness: Lethargic;Suspect due to medications Behavior During Therapy: [facility] Th[facility] RegionWFL for tasks assessed/performed Overall Cognitive Status: Within Functional Limits for tasks assessed  Exercises Total Joint Exercises Ankle Circles/Pumps: AROM;Both;10 reps;Seated      Assessment/Plan    PT Assessment Patient needs continued PT services  PT Diagnosis Difficulty walking;Abnormality of gait;Generalized weakness;Acute pain   PT Problem List Decreased strength;Decreased activity tolerance;Decreased range of motion;Decreased balance;Decreased mobility;Decreased knowledge of use of DME;Decreased knowledge of precautions;Pain  PT Treatment Interventions DME instruction;Gait training;Stair training;Functional mobility training;Therapeutic activities;Therapeutic exercise;Balance training;Neuromuscular re-education;Patient/family  education;Modalities;Manual techniques   PT Goals (Current goals can be found in the Care Plan section) Acute Rehab PT Goals Patient Stated Goal: to get better and go home PT Goal Formulation: With patient Time For Goal Achievement: 06/10/14 Potential to Achieve Goals: Good    Frequency 7X/week   Barriers to discharge           End of Session Equipment Utilized During Treatment: Right knee immobilizer Activity Tolerance: Patient limited by fatigue;Patient limited by pain Patient left: in chair;with call bell/phone within reach Nurse Communication: Mobility status (to RN tech)         Time: [id number] PT Time Calculation (min): 21 min   Charges:   PT Evaluation $Initial PT Evaluation Tier I: 1 Procedure PT Treatments $Therapeutic Activity: 8-22 mins        Rebecca B. Medendorp, PT, DPT [phone]   06/03/2014, 5:43 PM

## 2014-06-04 LAB — BASIC METABOLIC PANEL
BUN: 17 mg/dL (ref 6–23)
CO2: 24 mEq/L (ref 19–32)
Calcium: 8.5 mg/dL (ref 8.4–10.5)
Chloride: 97 mEq/L (ref 96–112)
Creatinine, Ser: 1.03 mg/dL (ref 0.50–1.10)
GFR, EST AFRICAN AMERICAN: 59 mL/min — AB (ref >90)
GFR, EST NON AFRICAN AMERICAN: 51 mL/min — AB (ref >90)
Glucose, Bld: 164 mg/dL — ABNORMAL HIGH (ref 70–99)
POTASSIUM: 3.4 meq/L — AB (ref 3.7–5.3)
SODIUM: 137 meq/L (ref 137–147)

## 2014-06-04 LAB — CBC
HCT: 29.8 % — ABNORMAL LOW (ref 36.0–46.0)
Hemoglobin: 9.7 g/dL — ABNORMAL LOW (ref 12.0–15.0)
MCH: 27.5 pg (ref 26.0–34.0)
MCHC: 32.6 g/dL (ref 30.0–36.0)
MCV: 84.4 fL (ref 78.0–100.0)
PLATELETS: 152 10*3/uL (ref 150–400)
RBC: 3.53 MIL/uL — ABNORMAL LOW (ref 3.87–5.11)
RDW: 13.7 % (ref 11.5–15.5)
WBC: 8.4 10*3/uL (ref 4.0–10.5)

## 2014-06-04 NOTE — Progress Notes (Signed)
Physical Therapy Treatment Patient Details Name: [name] MRN: [id number] DOB: 1934-11-10 Today's Date: 06/04/2014    History of Present Illness      PT Comments    Good progress with ambulation.  Pt will need stair training prior to d/c home.  Follow Up Recommendations  Home health PT;Supervision for mobility/OOB     Equipment Recommendations  None recommended by PT    Recommendations for Other Services       Precautions / Restrictions Precautions Precautions: Fall;Knee Required Braces or Orthoses: Knee Immobilizer - Right Knee Immobilizer - Right: Discontinue once straight leg raise with < 10 degree lag Restrictions RLE Weight Bearing: Weight bearing as tolerated    Mobility  Bed Mobility         Supine to sit: Min guard        Transfers   Equipment used: Rolling walker (2 wheeled)   Sit to Stand: Min assist Stand pivot transfers: Min assist          Ambulation/Gait Ambulation/Gait assistance: Min guard Ambulation Distance (Feet): 60 Feet Assistive device: Rolling walker (2 wheeled) Gait Pattern/deviations: Step-to pattern;Decreased stride length Gait velocity: decreased   General Gait Details: continuous verbal cues for sequencing, safety   Stairs            Wheelchair Mobility    Modified Rankin (Stroke Patients Only)       Balance                                    Cognition Arousal/Alertness: Awake/alert Behavior During Therapy: WFL for tasks assessed/performed Overall Cognitive Status: Within Functional Limits for tasks assessed                      Exercises      General Comments        Pertinent Vitals/Pain 0/10    Home Living                      Prior Function Level of Independence: Independent with assistive device(s)          PT Goals (current goals can now be found in the care plan section) Progress towards PT goals: Progressing toward goals    Frequency  7X/week    PT Plan Current plan remains appropriate    Co-evaluation             End of Session Equipment Utilized During Treatment: Gait belt;Right knee immobilizer Activity Tolerance: Patient tolerated treatment well Patient left: in chair;with call bell/phone within reach;with family/visitor present     Time: [id number] PT Time Calculation (min): 24 min  Charges:  $Gait Training: 23-37 mins                    G Codes:      Ilda FoilGarrow, Wendy Rene 06/04/2014, 3:01 PM  Aida RaiderWendy Garrow, PT  Office # [phone] Pager [phone]

## 2014-06-04 NOTE — Progress Notes (Signed)
PATIENT ID: [name]  MRN: [id number]  DOB/AGE:  [birth year] / [age] y.o.  1 Day Post-Op Procedure(s) (LRB): RIGHT TOTAL KNEE ARTHROPLASTY  (Right)    PROGRESS NOTE Subjective: Patient is alert, oriented, no Nausea, no Vomiting, yes passing gas, no Bowel Movement. Taking PO Well. Denies SOB, Chest or Calf Pain. Using Incentive Spirometer, PAS in place. Ambulate WBAT, CPM 0-60 Patient reports pain as mild and moderate  .    Objective: Vital signs in last 24 hours: Filed Vitals:   06/03/14 1736 06/03/14 2017 06/04/14 0321 06/04/14 0616  BP:  152/94 108/64 119/67  Pulse:  86 79 65  Temp:  99 F (37.2 C) 98.3 F (36.8 C) 97.9 F (36.6 C)  TempSrc:  Oral Oral Oral  Resp:  16 16 18   Height: 4\' 10"  (1.473 m)     Weight: 65.772 kg (145 lb)     SpO2:  91% 97% 100%      Intake/Output from previous day: I/O last 3 completed shifts: In: 2510 [P.O.:480; I.V.:2030] Out: 1450 [Urine:1300; Drains:150]   Intake/Output this shift:     LABORATORY DATA:  Recent Labs  06/04/14 0510  WBC 8.4  HGB 9.7*  HCT 29.8*  PLT 152  NA 137  K 3.4*  CL 97  CO2 24  BUN 17  CREATININE 1.03  GLUCOSE 164*  CALCIUM 8.5    Examination: Neurologically intact ABD soft Neurovascular intact Sensation intact distally Intact pulses distally Dorsiflexion/Plantar flexion intact Incision: dressing C/D/I No cellulitis present Compartment soft}  Assessment:   1 Day Post-Op Procedure(s) (LRB): RIGHT TOTAL KNEE ARTHROPLASTY  (Right) ADDITIONAL DIAGNOSIS:   Plan: PT/OT WBAT, CPM 5/hrs day until ROM 0-90 degrees, then D/C CPM DVT Prophylaxis:  SCDx72hrs, ASA 325 mg BID x 2 weeks DISCHARGE PLAN: Home, when pt passes PT goals DISCHARGE NEEDS: HHPT, HHRN, CPM, Walker and 3-in-1 comode seat     PHILLIPS, ERIC R 06/04/2014, 8:31 AM

## 2014-06-04 NOTE — Progress Notes (Signed)
Physical Therapy Treatment Patient Details Name: [name] MRN: [id number] DOB: 1934/06/21 Today's Date: 06/04/2014    History of Present Illness      PT Comments    Pt more awake and alert this morning.  Progressing well with PT.  Follow Up Recommendations  Home health PT;Supervision for mobility/OOB     Equipment Recommendations  None recommended by PT    Recommendations for Other Services       Precautions / Restrictions Precautions Precautions: Knee Required Braces or Orthoses: Knee Immobilizer - Right Knee Immobilizer - Right: Discontinue once straight leg raise with < 10 degree lag Restrictions RLE Weight Bearing: Weight bearing as tolerated    Mobility  Bed Mobility         Supine to sit: Min assist     General bed mobility comments: verbal cues for sequencing  Transfers   Equipment used: Rolling walker (2 wheeled)   Sit to Stand: Min assist         General transfer comment: verbal cues for hand placement  Ambulation/Gait Ambulation/Gait assistance: Min guard Ambulation Distance (Feet): 30 Feet Assistive device: Rolling walker (2 wheeled) Gait Pattern/deviations: Decreased stride length;Step-to pattern Gait velocity: decreased       Stairs            Wheelchair Mobility    Modified Rankin (Stroke Patients Only)       Balance                                    Cognition Arousal/Alertness: Awake/alert Behavior During Therapy: WFL for tasks assessed/performed Overall Cognitive Status: Within Functional Limits for tasks assessed                      Exercises Total Joint Exercises Ankle Circles/Pumps: AROM;Both;10 reps Quad Sets: AROM;Right;10 reps Heel Slides: AAROM;Right;10 reps Hip ABduction/ADduction: AAROM;Right;10 reps    General Comments        Pertinent Vitals/Pain 0/10    Home Living                      Prior Function            PT Goals (current goals can now be  found in the care plan section) Progress towards PT goals: Progressing toward goals    Frequency  7X/week    PT Plan Current plan remains appropriate    Co-evaluation             End of Session Equipment Utilized During Treatment: Gait belt;Right knee immobilizer Activity Tolerance: Patient tolerated treatment well Patient left: in chair;with call bell/phone within reach     Time: 0927-0950 PT Time Calculation (min): 23 min  Charges:  $Gait Training: 8-22 mins $Therapeutic Exercise: 8-22 mins                    G Codes:      Ilda FoilGarrow, Wendy Rene 06/04/2014, 9:59 AM  Aida RaiderWendy Garrow, PT  Office # [phone] Pager [phone]

## 2014-06-05 LAB — CBC
HCT: 24.3 % — ABNORMAL LOW (ref 36.0–46.0)
Hemoglobin: 8.1 g/dL — ABNORMAL LOW (ref 12.0–15.0)
MCH: 27.9 pg (ref 26.0–34.0)
MCHC: 33.3 g/dL (ref 30.0–36.0)
MCV: 83.8 fL (ref 78.0–100.0)
Platelets: 156 10*3/uL (ref 150–400)
RBC: 2.9 MIL/uL — AB (ref 3.87–5.11)
RDW: 13.9 % (ref 11.5–15.5)
WBC: 13.1 10*3/uL — ABNORMAL HIGH (ref 4.0–10.5)

## 2014-06-05 NOTE — Progress Notes (Signed)
Physical Therapy Treatment Patient Details Name: Janet GrumblesSusan A [name] MRN: [id number] DOB: 01/28/1934 Today's Date: 06/05/2014    History of Present Illness 80 [facility].o. [facility] for [facility] R TKA.  She has significant PMhx of HTN, and mitral valve repair.     PT Comments    Performed full (HEP) there-ex program this am. Tolerated well. Will see once more to perform stair negotiation and ambulation in preparation for dc home.  Follow Up Recommendations  Home health PT;Supervision for mobility/OOB     Equipment Recommendations  None recommended by PT    Recommendations for Other Services       Precautions / Restrictions Precautions Precautions: Fall;Knee Required Braces or Orthoses: Knee Immobilizer - Right Knee Immobilizer - Right: Discontinue once straight leg raise with < 10 degree lag (discontinued today 6/21) Restrictions Weight Bearing Restrictions: Yes RLE Weight Bearing: Weight bearing as tolerated    Mobility  Bed Mobility Overal bed mobility: Needs Assistance Bed Mobility: Supine to Sit     Supine to sit: Min guard     General bed mobility comments: verbal cues for sequencing  Transfers Overall transfer level: Needs assistance Equipment used: Rolling walker (2 wheeled) Transfers: Sit to/from Stand Sit to Stand: Min assist         General transfer comment: verbal cues for hand placement  Ambulation/Gait Ambulation/Gait assistance: Min guard Ambulation Distance (Feet): 20 Feet Assistive device: Rolling walker (2 wheeled) Gait Pattern/deviations: Step-to pattern;Decreased stride length Gait velocity: decreased       Stairs            Wheelchair Mobility    Modified Rankin (Stroke Patients Only)       Balance     Sitting balance-Leahy Scale: Good       Standing balance-Leahy Scale: Poor                      Cognition Arousal/Alertness: Awake/alert Behavior During Therapy: WFL for tasks  assessed/performed Overall Cognitive Status: Within Functional Limits for tasks assessed                      Exercises Total Joint Exercises Ankle Circles/Pumps: AROM;Both;10 reps Quad Sets: AROM;Right;10 reps (with 5 second holds) Towel Squeeze: AROM;Right;10 reps Short Arc Quad: AROM;Right;10 reps (with 5 second holds) Heel Slides: AROM;Right;10 reps Hip ABduction/ADduction: AROM;Right;10 reps Straight Leg Raises: AROM;Right;10 reps (performed with no lag) Long Arc Quad: AROM;Right;10 reps (seated EOB)    General Comments        Pertinent Vitals/Pain 3/10     Home Living                      Prior Function            PT Goals (current goals can now be found in the care plan section) Acute Rehab PT Goals Patient Stated Goal: to get better and go home PT Goal Formulation: With patient Time For Goal Achievement: 06/10/14 Potential to Achieve Goals: Good Progress towards PT goals: Progressing toward goals    Frequency  7X/week    PT Plan Current plan remains appropriate    Co-evaluation             End of Session Equipment Utilized During Treatment: Gait belt Activity Tolerance: Patient tolerated treatment well Patient left: in chair;with call bell/phone within reach     Time: 0831-0901 PT Time Calculation (min): 30 min  Charges:  $Therapeutic Exercise:  23-37 mins                    G CodesFabio Asa:      Werner, Devon J 06/05/2014, 9:40 AM Charlotte Crumbevon Werner, PT DPT  [phone]

## 2014-06-05 NOTE — Progress Notes (Signed)
Discharge instructions and prescriptions given to and reviewed with patient. Patient denies questions or concerns. Patient states DME already delivered to house. Process of HHPT set up in progress, patient states "they can call me at home, I am leaving now". IV removed with no complications. VS stable. Patient discharged via wheelchair with personal belongings, discharge packet, and prescriptions. Patient available to be reached at [phone] (home) or [phone] (cell).

## 2014-06-05 NOTE — Progress Notes (Signed)
PATIENT ID: Janet GrumblesSusan A [name]  MRN: [id number]  DOB/AGE:  02-23-1934 / 80 y.o.  2 Days Post-Op Procedure(s) (LRB): RIGHT TOTAL KNEE ARTHROPLASTY  (Right)    PROGRESS NOTE Subjective: Patient is alert, oriented, no Nausea, no Vomiting, yes passing gas, no Bowel Movement. Taking PO well. Denies SOB, Chest or Calf Pain. Using Incentive Spirometer, PAS in place. Ambulate WBAT, CPM 0-60 Patient reports pain as 5 on 0-10 scale.  Pt denies fatigue or lightheadedness.  Pt sitting up in bed and states she would like to go home.  Objective: Vital signs in last 24 hours: Filed Vitals:   06/04/14 0616 06/04/14 1443 06/04/14 1918 06/05/14 0542  BP: 119/67 134/71 139/67 133/61  Pulse: 65 67 61 59  Temp: 97.9 F (36.6 C) 97.9 F (36.6 C) 98.6 F (37 C) 97.8 F (36.6 C)  TempSrc: Oral  Oral Oral  Resp: 18 18 18 18   Height:      Weight:      SpO2: 100% 100% 100% 97%      Intake/Output from previous day: I/O last 3 completed shifts: In: 2046.3 [P.O.:1200; I.V.:846.3] Out: 900 [Urine:900]   Intake/Output this shift:     LABORATORY DATA:  Recent Labs  06/04/14 0510 06/05/14 0400  WBC 8.4 13.1*  HGB 9.7* 8.1*  HCT 29.8* 24.3*  PLT 152 156  NA 137  --   K 3.4*  --   CL 97  --   CO2 24  --   BUN 17  --   CREATININE 1.03  --   GLUCOSE 164*  --   CALCIUM 8.5  --     Examination: Neurologically intact Neurovascular intact Sensation intact distally Intact pulses distally Dorsiflexion/Plantar flexion intact Incision: dressing C/D/I No cellulitis present Compartment soft}  Assessment:   2 Days Post-Op Procedure(s) (LRB): RIGHT TOTAL KNEE ARTHROPLASTY  (Right) ADDITIONAL DIAGNOSIS:    Plan: PT/OT WBAT, CPM 5/hrs day until ROM 0-90 degrees, then D/C CPM DVT Prophylaxis:  SCDx72hrs, ASA 325 mg BID x 2 weeks DISCHARGE PLAN: Home today once she has passed per PT goals.  Pt must practice steps. DISCHARGE NEEDS: HHPT, HHRN, CPM, Walker and 3-in-1 comode seat     PHILLIPS, ERIC  R 06/05/2014, 8:14 AM

## 2014-06-05 NOTE — Care Management Note (Signed)
    Page 1 of 1   06/05/2014     5:22:45 PM CARE MANAGEMENT NOTE 06/05/2014  Patient:  [name],[name]   Account Number:  [id number]  Date Initiated:  06/03/2014  Documentation initiated by:  Vance PeperBRADY,Daun  Subjective/Objective Assessment:   [age]yr old female s/p right total knee arthroplasty.     Action/Plan:   PT/OT eval  Case manager will continue to monitor.   Anticipated DC Date:     Anticipated DC Plan:  HOME W HOME HEALTH SERVICES      DC Planning Services  CM consult      Choice offered to / List presented [facility]:          [facility]      [facility]   Status of service:  Completed, signed off Medicare Important Message given?   (If response is "NO", the following Medicare IM given date fields will be blank) Date Medicare IM given:   Date Additional Medicare IM given:    Discharge Disposition:  HOME W HOME HEALTH SERVICES  Per UR Regulation:    If discussed at Long Length of Stay Meetings, dates discussed:    Comments:  06/05/14 17:20 Cm notified [facility] of pt discharge and orders placed for HHPT via phone to [place].  No other CM needs were communicated.  Freddy JakschSarah jeffries, Bsn, CM, [phone].

## 2014-06-05 NOTE — Discharge Summary (Signed)
Patient ID: [name] MRN: [id number] DOB/AGE: 07/08/1934 80 y.o.  Admit date: 06/03/2014 Discharge date: 06/05/2014  Admission Diagnoses:  Principal Problem:   Osteoarthritis of right knee   Discharge Diagnoses:  Same  Past Medical History  Diagnosis Date  . Family history of anesthesia complication     Daughter; takes a long time to wake up  . Hypertension   . Shortness of breath     while walking  . Frequency of urination     at time  . GERD (gastroesophageal reflux disease)   . Arthritis   . Hyperlipemia     Surgeries: Procedure(s): RIGHT TOTAL KNEE ARTHROPLASTY  on 06/03/2014   Consultants:    Discharged Condition: Improved  Hospital Course: [name] is an 80 y.o. female who was admitted 06/03/2014 for operative treatment ofOsteoarthritis of right knee. Patient has severe unremitting pain that affects sleep, daily activities, and work/hobbies. After pre-op clearance the patient was taken to the operating room on 06/03/2014 and underwent  Procedure(s): RIGHT TOTAL KNEE ARTHROPLASTY .    Patient was given perioperative antibiotics: Anti-infectives   Start     Dose/Rate Route Frequency Ordered Stop   06/03/14 1600  ceFAZolin (ANCEF) IVPB 2 g/50 mL premix     2 g 100 mL/hr over 30 Minutes Intravenous Every 6 hours 06/03/14 1413 06/03/14 2225   06/03/14 0600  ceFAZolin (ANCEF) IVPB 2 g/50 mL premix     2 g 100 mL/hr over 30 Minutes Intravenous On call to O.R. 06/02/14 1418 06/03/14 1041       Patient was given sequential compression devices, early ambulation, and chemoprophylaxis to prevent DVT.  Patient benefited maximally from hospital stay and there were no complications.    Recent vital signs: Patient Vitals for the past 24 hrs:  BP Temp Temp src Pulse Resp SpO2  06/05/14 0542 133/61 mmHg 97.8 F (36.6 C) Oral 59 18 97 %  06/04/14 1918 139/67 mmHg 98.6 F (37 C) Oral 61 18 100 %  06/04/14 1443 134/71 mmHg 97.9 F (36.6 C) - 67 18 100 %     Recent  laboratory studies:  Recent Labs  06/04/14 0510 06/05/14 0400  WBC 8.4 13.1*  HGB 9.7* 8.1*  HCT 29.8* 24.3*  PLT 152 156  NA 137  --   K 3.4*  --   CL 97  --   CO2 24  --   BUN 17  --   CREATININE 1.03  --   GLUCOSE 164*  --   CALCIUM 8.5  --      Discharge Medications:     Medication List         acetaminophen 650 MG CR tablet  Commonly known as:  TYLENOL  Take 650 mg by mouth every 8 (eight) hours as needed for pain.     AFRIN NASAL SPRAY NA  Place 1 spray into both nostrils daily as needed (congestion).     aspirin EC 325 MG tablet  Take 1 tablet (325 mg total) by mouth 2 (two) times daily after a meal.     dicyclomine 10 MG capsule  Commonly known as:  BENTYL  Take 10 mg by mouth daily before lunch.     diltiazem 240 MG 24 hr capsule  Commonly known as:  DILACOR XR  Take 240 mg by mouth daily.     methocarbamol 500 MG tablet  Commonly known as:  ROBAXIN-750  Take 1 tablet (500 mg total) by mouth every 8 (eight)  hours as needed for muscle spasms.     metoprolol tartrate 25 MG tablet  Commonly known as:  LOPRESSOR  Take 25 mg by mouth daily.     omeprazole 20 MG capsule  Commonly known as:  PRILOSEC  Take 20 mg by mouth daily.     oxyCODONE-acetaminophen 5-325 MG per tablet  Commonly known as:  PERCOCET/ROXICET  Take 1-2 tablets by mouth every 6 (six) hours as needed for severe pain.     simvastatin 20 MG tablet  Commonly known as:  ZOCOR  Take 20 mg by mouth daily.     SYSTANE OP  Place 2 drops into both eyes 2 (two) times daily as needed (dry eyes).     VITAMIN C PO  Take 1 tablet by mouth daily.        Diagnostic Studies: Dg Chest 2 View  05/25/2014   CLINICAL DATA:  Preop for right knee surgery  EXAM: CHEST  2 VIEW  COMPARISON:  08/14/2009  FINDINGS: Cardiomegaly. Mitral valve prosthesis again noted. No acute infiltrate or pleural effusion. Tortuous thoracic aorta. Degenerative changes thoracic spine.  IMPRESSION: Cardiomegaly. No active  disease. Again noted mitral valve prosthesis.   Electronically Signed   By: Natasha MeadLiviu  Pop M.D.   On: 05/25/2014 16:05   Nm Myocar Multi W/spect W/wall Motion / Ef  06/02/2014   CLINICAL DATA:  Preop clearance  EXAM: MYOCARDIAL IMAGING WITH SPECT (REST AND PHARMACOLOGIC-STRESS)  GATED LEFT VENTRICULAR WALL MOTION STUDY  LEFT VENTRICULAR EJECTION FRACTION  TECHNIQUE: Standard myocardial SPECT imaging was performed after resting intravenous injection of 10 mCi Tc-2420m sestamibi. Subsequently, intravenous infusion of Lexiscan was performed under the supervision of the Cardiology staff. At peak effect of the drug, 30 mCi Tc-520m sestamibi was injected intravenously and standard myocardial SPECT imaging was performed. Quantitative gated imaging was also performed to evaluate left ventricular wall motion, and estimate left ventricular ejection fraction.  COMPARISON:  08/13/2007  FINDINGS: The stress SPECT images demonstrate physiologic distribution of radiopharmaceutical. Rest images demonstrate regionally decreased distribution of radiopharmaceutical related to artifact from GI uptake.  The gated stress SPECT images demonstrate normal left ventricular myocardial thickening. No focal wall motion abnormality is seen.  Calculated left ventricular end-diastolic volume 70 mL, end-systolic volume 40 mL, ejection fraction of 49%.  IMPRESSION: 1. Negative for pharmacologic-stress induced ischemia.  2. Left ventricular ejection fraction 49%.   Electronically Signed   By: Charline BillsSriyesh  Krishnan M.D.   On: 06/02/2014 15:07    Disposition:       Discharge Instructions   CPM    Complete by:  As directed   Continuous passive motion machine (CPM):      Use the CPM from 0 to 60  for 5 hours per day.      You may increase by 10 degrees per day.  You may break it up into 2 or 3 sessions per day.      Use CPM for 2 weeks or until you are told to stop.     Call MD / Call 911    Complete by:  As directed   If you experience chest pain  or shortness of breath, CALL 911 and be transported to the hospital emergency room.  If you develope a fever above 101 F, pus (white drainage) or increased drainage or redness at the wound, or calf pain, call your surgeon's office.     Change dressing    Complete by:  As directed   Change dressing on 5,  then change the dressing daily with sterile 4 x 4 inch gauze dressing and apply TED hose.  You may clean the incision with alcohol prior to redressing.     Constipation Prevention    Complete by:  As directed   Drink plenty of fluids.  Prune juice may be helpful.  You may use a stool softener, such as Colace (over the counter) 100 mg twice a day.  Use MiraLax (over the counter) for constipation as needed.     Diet - low sodium heart healthy    Complete by:  As directed      Discharge instructions    Complete by:  As directed   Follow up with Dr. Luiz BlareGraves in 2 weeks.     Driving restrictions    Complete by:  As directed   No driving for 2 weeks     Increase activity slowly as tolerated    Complete by:  As directed      Patient may shower    Complete by:  As directed   You may shower without a dressing once there is no drainage.  Do not wash over the wound.  If drainage remains, cover wound with plastic wrap and then shower.           Follow-up Information   Follow up with GRAVES,JOHN L, MD. Schedule an appointment as soon as possible for a visit in 2 weeks.   Specialty:  Orthopedic Surgery   Contact information:   [address] [place] [address] [phone]        Signed: Henry RusselHILLIPS, ERIC R 06/05/2014, 8:19 AM

## 2014-06-05 NOTE — Progress Notes (Signed)
Physical Therapy Treatment Patient Details Name: Janet GrumblesSusan A [name] MRN: [id number] DOB: November 21, 1934 Today's Date: 06/05/2014    History of Present Illness 80 [facility].o. [facility] for elective R [facility]KA.  She has significant PMhx of HTN, and mitral valve repair.     PT Comments    Patient ambulated increased distance, performed stair negotiation and simulated car transfer. Patient and family educated on mobility expectations and safety for discharge. All concerns addressed, feel patient is safe for dc home with supervision.    Follow Up Recommendations  Home health PT;Supervision for mobility/OOB     Equipment Recommendations  None recommended by PT    Recommendations for Other Services       Precautions / Restrictions Precautions Precautions: Fall;Knee Required Braces or Orthoses: Knee Immobilizer - Right Knee Immobilizer - Right: Discontinue once straight leg raise with < 10 degree lag (discontinued today 6/21) Restrictions Weight Bearing Restrictions: Yes RLE Weight Bearing: Weight bearing as tolerated    Mobility  Bed Mobility Overal bed mobility: Needs Assistance Bed Mobility: Supine to Sit     Supine to sit: Min guard     General bed mobility comments: verbal cues for sequencing  Transfers Overall transfer level: Needs assistance Equipment used: Rolling walker (2 wheeled) Transfers: Sit to/from Stand Sit to Stand: Supervision         General transfer comment: verbal cues for hand placement  Ambulation/Gait Ambulation/Gait assistance: Supervision Ambulation Distance (Feet): 240 Feet Assistive device: Rolling walker (2 wheeled) Gait Pattern/deviations: Step-through pattern;Decreased stride length;Trunk flexed;Narrow base of support Gait velocity: decreased Gait velocity interpretation: Below normal speed for age/gender General Gait Details: VCs for imcreased cadence   Stairs Stairs: Yes Stairs assistance: Min guard Stair Management: One  rail Right;Step to pattern;Forwards Number of Stairs: 5 General stair comments: VCs for sequencing  Wheelchair Mobility    Modified Rankin (Stroke Patients Only)       Balance     Sitting balance-Leahy Scale: Good       Standing balance-Leahy Scale: Fair                      Cognition Arousal/Alertness: Awake/alert Behavior During Therapy: WFL for tasks assessed/performed Overall Cognitive Status: Within Functional Limits for tasks assessed                         General Comments        Pertinent Vitals/Pain 2/10    Home Living                      Prior Function            PT Goals (current goals can now be found in the care plan section) Acute Rehab PT Goals Patient Stated Goal: to get better and go home PT Goal Formulation: With patient Time For Goal Achievement: 06/10/14 Potential to Achieve Goals: Good Progress towards PT goals: Progressing toward goals    Frequency  7X/week    PT Plan Current plan remains appropriate    Co-evaluation             End of Session Equipment Utilized During Treatment: Gait belt;Right knee immobilizer Activity Tolerance: Patient tolerated treatment well Patient left: in chair;with call bell/phone within reach;with family/visitor present     Time: 9147-82951048-1115 PT Time Calculation (min): 27 min  Charges:  $Gait Training: 8-22 mins $Self Care/Home Management: 8-22  G CodesFabio [name]:      [name], [name] 06/05/2014, 12:13 PM [name], PT DPT  [phone]

## 2014-06-06 ENCOUNTER — Encounter (HOSPITAL_COMMUNITY): Payer: Self-pay | Admitting: Orthopedic Surgery

## 2014-07-04 ENCOUNTER — Ambulatory Visit: Payer: Medicare Other | Attending: Orthopaedic Surgery | Admitting: Physical Therapy

## 2014-07-04 DIAGNOSIS — M25669 Stiffness of unspecified knee, not elsewhere classified: Secondary | ICD-10-CM | POA: Insufficient documentation

## 2014-07-04 DIAGNOSIS — M25569 Pain in unspecified knee: Secondary | ICD-10-CM | POA: Insufficient documentation

## 2014-07-04 DIAGNOSIS — IMO0001 Reserved for inherently not codable concepts without codable children: Secondary | ICD-10-CM | POA: Insufficient documentation

## 2014-07-08 ENCOUNTER — Ambulatory Visit: Payer: Medicare Other

## 2014-07-08 DIAGNOSIS — IMO0001 Reserved for inherently not codable concepts without codable children: Secondary | ICD-10-CM | POA: Diagnosis not present

## 2014-07-18 ENCOUNTER — Ambulatory Visit: Payer: Medicare Other | Attending: Orthopaedic Surgery | Admitting: Physical Therapy

## 2014-07-18 DIAGNOSIS — IMO0001 Reserved for inherently not codable concepts without codable children: Secondary | ICD-10-CM | POA: Insufficient documentation

## 2014-07-18 DIAGNOSIS — M25569 Pain in unspecified knee: Secondary | ICD-10-CM | POA: Insufficient documentation

## 2014-07-18 DIAGNOSIS — M25669 Stiffness of unspecified knee, not elsewhere classified: Secondary | ICD-10-CM | POA: Insufficient documentation

## 2014-07-21 ENCOUNTER — Ambulatory Visit: Payer: Medicare Other | Admitting: Physical Therapy

## 2014-07-25 ENCOUNTER — Ambulatory Visit: Payer: Medicare Other | Admitting: Rehabilitation

## 2014-07-25 DIAGNOSIS — IMO0001 Reserved for inherently not codable concepts without codable children: Secondary | ICD-10-CM | POA: Diagnosis not present

## 2014-07-28 ENCOUNTER — Ambulatory Visit: Payer: Medicare Other | Admitting: Rehabilitation

## 2014-07-28 DIAGNOSIS — IMO0001 Reserved for inherently not codable concepts without codable children: Secondary | ICD-10-CM | POA: Diagnosis not present

## 2014-08-01 ENCOUNTER — Encounter: Payer: Medicare Other | Admitting: Rehabilitation

## 2014-08-03 ENCOUNTER — Encounter: Payer: Medicare Other | Admitting: Physical Therapy

## 2014-08-15 ENCOUNTER — Other Ambulatory Visit: Payer: Self-pay | Admitting: Family Medicine

## 2014-08-15 DIAGNOSIS — R1310 Dysphagia, unspecified: Secondary | ICD-10-CM

## 2014-08-17 ENCOUNTER — Other Ambulatory Visit: Payer: Medicare Other

## 2014-08-23 ENCOUNTER — Ambulatory Visit
Admission: RE | Admit: 2014-08-23 | Discharge: 2014-08-23 | Disposition: A | Discharge status: Home / Self Care | Payer: Medicare Other | Source: Ambulatory Visit | Attending: Family Medicine | Admitting: Family Medicine

## 2014-08-23 DIAGNOSIS — R1310 Dysphagia, unspecified: Secondary | ICD-10-CM

## 2014-09-29 ENCOUNTER — Other Ambulatory Visit: Payer: Self-pay | Admitting: Orthopedic Surgery

## 2014-10-11 ENCOUNTER — Encounter (HOSPITAL_COMMUNITY): Payer: Self-pay | Admitting: Pharmacy Technician

## 2014-10-18 ENCOUNTER — Encounter (HOSPITAL_COMMUNITY)
Admission: RE | Admit: 2014-10-18 | Discharge: 2014-10-18 | Disposition: A | Discharge status: Home / Self Care | Payer: Medicare Other | Source: Ambulatory Visit | Attending: Orthopedic Surgery | Admitting: Orthopedic Surgery

## 2014-10-18 ENCOUNTER — Other Ambulatory Visit (HOSPITAL_COMMUNITY): Payer: Self-pay | Admitting: *Deleted

## 2014-10-18 ENCOUNTER — Encounter (HOSPITAL_COMMUNITY): Payer: Self-pay

## 2014-10-18 DIAGNOSIS — E785 Hyperlipidemia, unspecified: Secondary | ICD-10-CM | POA: Diagnosis not present

## 2014-10-18 DIAGNOSIS — I340 Nonrheumatic mitral (valve) insufficiency: Secondary | ICD-10-CM | POA: Insufficient documentation

## 2014-10-18 DIAGNOSIS — K219 Gastro-esophageal reflux disease without esophagitis: Secondary | ICD-10-CM | POA: Insufficient documentation

## 2014-10-18 DIAGNOSIS — Z6827 Body mass index (BMI) 27.0-27.9, adult: Secondary | ICD-10-CM | POA: Insufficient documentation

## 2014-10-18 DIAGNOSIS — M1990 Unspecified osteoarthritis, unspecified site: Secondary | ICD-10-CM | POA: Diagnosis not present

## 2014-10-18 DIAGNOSIS — I517 Cardiomegaly: Secondary | ICD-10-CM | POA: Insufficient documentation

## 2014-10-18 DIAGNOSIS — Q211 Atrial septal defect: Secondary | ICD-10-CM | POA: Diagnosis not present

## 2014-10-18 DIAGNOSIS — I341 Nonrheumatic mitral (valve) prolapse: Secondary | ICD-10-CM | POA: Insufficient documentation

## 2014-10-18 DIAGNOSIS — Z96651 Presence of right artificial knee joint: Secondary | ICD-10-CM | POA: Insufficient documentation

## 2014-10-18 DIAGNOSIS — Z87891 Personal history of nicotine dependence: Secondary | ICD-10-CM | POA: Diagnosis not present

## 2014-10-18 DIAGNOSIS — R0609 Other forms of dyspnea: Secondary | ICD-10-CM | POA: Diagnosis not present

## 2014-10-18 DIAGNOSIS — E876 Hypokalemia: Secondary | ICD-10-CM | POA: Diagnosis not present

## 2014-10-18 DIAGNOSIS — Z01818 Encounter for other preprocedural examination: Secondary | ICD-10-CM | POA: Insufficient documentation

## 2014-10-18 DIAGNOSIS — Z90710 Acquired absence of both cervix and uterus: Secondary | ICD-10-CM | POA: Insufficient documentation

## 2014-10-18 DIAGNOSIS — I10 Essential (primary) hypertension: Secondary | ICD-10-CM | POA: Insufficient documentation

## 2014-10-18 LAB — CBC WITH DIFFERENTIAL/PLATELET
Basophils Absolute: 0 10*3/uL (ref 0.0–0.1)
Basophils Relative: 0 % (ref 0–1)
EOS PCT: 1 % (ref 0–5)
Eosinophils Absolute: 0.1 10*3/uL (ref 0.0–0.7)
HCT: 36.4 % (ref 36.0–46.0)
Hemoglobin: 11.8 g/dL — ABNORMAL LOW (ref 12.0–15.0)
LYMPHS ABS: 2 10*3/uL (ref 0.7–4.0)
Lymphocytes Relative: 39 % (ref 12–46)
MCH: 25.5 pg — AB (ref 26.0–34.0)
MCHC: 32.4 g/dL (ref 30.0–36.0)
MCV: 78.6 fL (ref 78.0–100.0)
MONO ABS: 0.5 10*3/uL (ref 0.1–1.0)
Monocytes Relative: 9 % (ref 3–12)
Neutro Abs: 2.6 10*3/uL (ref 1.7–7.7)
Neutrophils Relative %: 51 % (ref 43–77)
PLATELETS: 234 10*3/uL (ref 150–400)
RBC: 4.63 MIL/uL (ref 3.87–5.11)
RDW: 19.2 % — ABNORMAL HIGH (ref 11.5–15.5)
WBC: 5.1 10*3/uL (ref 4.0–10.5)

## 2014-10-18 LAB — URINALYSIS, ROUTINE W REFLEX MICROSCOPIC
Bilirubin Urine: NEGATIVE
Glucose, UA: NEGATIVE mg/dL
Hgb urine dipstick: NEGATIVE
Ketones, ur: NEGATIVE mg/dL
NITRITE: NEGATIVE
Protein, ur: NEGATIVE mg/dL
SPECIFIC GRAVITY, URINE: 1.024 (ref 1.005–1.030)
Urobilinogen, UA: 0.2 mg/dL (ref 0.0–1.0)
pH: 6 (ref 5.0–8.0)

## 2014-10-18 LAB — COMPREHENSIVE METABOLIC PANEL
ALBUMIN: 3.8 g/dL (ref 3.5–5.2)
ALK PHOS: 69 U/L (ref 39–117)
ALT: 5 U/L (ref 0–35)
ANION GAP: 16 — AB (ref 5–15)
AST: 13 U/L (ref 0–37)
BILIRUBIN TOTAL: 0.4 mg/dL (ref 0.3–1.2)
BUN: 13 mg/dL (ref 6–23)
CALCIUM: 9.3 mg/dL (ref 8.4–10.5)
CO2: 28 mEq/L (ref 19–32)
CREATININE: 0.92 mg/dL (ref 0.50–1.10)
Chloride: 99 mEq/L (ref 96–112)
GFR calc Af Amer: 68 mL/min — ABNORMAL LOW (ref >90)
GFR, EST NON AFRICAN AMERICAN: 59 mL/min — AB (ref >90)
GLUCOSE: 97 mg/dL (ref 70–99)
Potassium: 2.6 mEq/L — CL (ref 3.7–5.3)
SODIUM: 143 meq/L (ref 137–147)
Total Protein: 7.3 g/dL (ref 6.0–8.3)

## 2014-10-18 LAB — TYPE AND SCREEN
ABO/RH(D): O POS
Antibody Screen: NEGATIVE

## 2014-10-18 LAB — PROTIME-INR
INR: 1.07 (ref 0.00–1.49)
Prothrombin Time: 14 seconds (ref 11.6–15.2)

## 2014-10-18 LAB — SURGICAL PCR SCREEN
MRSA, PCR: NEGATIVE
Staphylococcus aureus: NEGATIVE

## 2014-10-18 LAB — URINE MICROSCOPIC-ADD ON

## 2014-10-18 LAB — APTT: aPTT: 31 seconds (ref 24–37)

## 2014-10-18 NOTE — Pre-Procedure Instructions (Signed)
Estelle GrumblesSusan A Blakley  10/18/2014   Your procedure is scheduled on:  Wednesday, October 26, 2014 at 12:30 PM.   [facility] at 10:30 AM.   Call this number if you have problems the morning of surgery: [phone]   Remember:   Do not eat food or drink liquids after midnight Tuesday, 10/25/14.   Take these medicines the morning of surgery with A SIP OF WATER: diltiazem (DILACOR XR), metoprolol tartrate (LOPRESSOR), omeprazole (PRILOSEC), Polyethyl Glycol-Propyl Glycol (SYSTANE OP), Oxymetazoline HCl (AFRIN NASAL SPRAY NA) - if needed, acetaminophen (TYLENOL) - if needed.   Do not wear jewelry, make-up or nail polish.  Do not wear lotions, powders, or perfumes. You may wear deodorant.  Do not shave 48 hours prior to surgery.   Do not bring valuables to the hospital.  [facility]                  for any belongings or valuables.               Contacts, dentures or bridgework may not be worn into surgery.  Leave suitcase in the car. After surgery it may be brought to your room.  For patients admitted to the hospital, discharge time is determined by your                treatment team.               Special Instructions: See Preparing for Surgery instructions   Please read over the following fact sheets that you were given: Pain Booklet, Coughing and Deep Breathing, Blood Transfusion Information, MRSA Information and Surgical Site Infection Prevention

## 2014-10-18 NOTE — Progress Notes (Signed)
I  will contact Dr Luiz Blaregraves office to report K of 2.6. And give to Trinity Muscatinellison for review.

## 2014-10-19 NOTE — Progress Notes (Signed)
Anesthesia Chart Review:  Pt is 80 year old female scheduled for L total knee arthroplasty on 10/26/2014 with Dr. Luiz [name].   History includes MVP with severe MR and PFO s/p right mini-thoracotomy for MV repair with quadrangular resection and ring annuloplasty and PFO closure 07/05/09, former smoker, HTN, GERD, HLD, arthritis, dyspnea on exertion, cataract extraction, hysterectomy. BMI is 27.49.   Medications include: metoprolol, lisinopril/hctz, diltiazem, prilosec, simvastatin, dicyclomine.   Cardiologist is Dr. Nadara [name].   EKG on 05/25/14 showed: NSR, possible LAE, LAFB, LVH with repolarization abnormality, cannot rule out septal infarct (age undetermined), poor quality tracing (particularly involving V4-6).  Echo: From Dr. Roseanne [name]'s office, pt's previous cardiologist (scanned under media tab): The last echo done was on 10/22/11 and showed: Normal LV size and mild septal hypokinesis with mild systolic and diastolic dysfunction. EF 55%. Mild LVH. Normal RV size and systolic function. Normal LA and RA size. MV with annuloplasty ring, minimal MR and mild MS. Normal TV with mild TR. Normal AV. Mild PI. Mildly elevated pulmonary systolic pressure. MV area 1.29-1.76 cm2. No intracardia mass. No pericardial effusion.  According to 06/09/09 CT surgery notes, "Cardiac catheterization is also reviewed. This demonstrates codominant coronary circulation with normal coronary artery anatomy. No significant coronary artery disease. Left ventricular systolic function appears preserved. There is obvious severe mitral regurgitation." Encounter was 05/03/09 with Dr. Algie [name], but there is no cath report noted in Epic, E-chart, or Access Anywhere.  Nuclear med stress test 06/02/2014: 1. Negative for pharmacologic-stress induced ischemia. 2. Left ventricular ejection fraction 49%  CXR on 05/25/14 showed: Cardiomegaly. No active disease. Again noted mitral valve prosthesis.  Preoperative labs noted. K is 2.6. PAT RN Janet CrawfordKathie  [name] reported this to Dr. Luiz [name]' office on 10/18/2014. Will repeat I-stat 4 DOS.   Cardiac clearance note from previous R knee replacement surgery June 2015 from Dr. Nadara [name] on chart. Pt has not been seen by [facility] since. Tolerated her previous surgery without apparent complications.    If no changes and K improved, I anticipate pt can proceed with surgery as scheduled.   Janet Mastngela Halynn Reitano, FNP-BC [facility] Phone: [phone] 10/19/2014 2:56 PM

## 2014-10-20 NOTE — Progress Notes (Signed)
Anesthesia follow-up:  See note by Vassie MomentKabbe, NP on 10/19/14.  Darl PikesSusan from Dr. Luiz BlareGraves office called to say that she has spoken with patient's PCP office regarding ordering KCL supplement for patient's hypokalemia.  If she receives any follow-up labs then I've asked her to fax them to [phone] otherwise continue with plan for ISTAT4 on arrival.  Velna Ochsllison Gaylord Seydel, PA-C [facility]iology Phone [phone] 10/20/2014 12:48 PM

## 2014-10-25 MED ORDER — CEFAZOLIN SODIUM-DEXTROSE 2-3 GM-% IV SOLR
2.0000 g | INTRAVENOUS | Status: AC
  Administered 2014-10-26: 2 g via INTRAVENOUS
  Filled 2014-10-25: qty 50

## 2014-10-26 ENCOUNTER — Encounter (HOSPITAL_COMMUNITY)
Admission: RE | Disposition: A | Discharge status: Home Health | Payer: Self-pay | Source: Ambulatory Visit | Attending: Orthopedic Surgery

## 2014-10-26 ENCOUNTER — Inpatient Hospital Stay (HOSPITAL_COMMUNITY)
Admission: RE | Admit: 2014-10-26 | Discharge: 2014-10-28 | DRG: 470 | Disposition: A | Discharge status: Home Health | Payer: Medicare Other | Source: Ambulatory Visit | Attending: Orthopedic Surgery | Admitting: Orthopedic Surgery

## 2014-10-26 ENCOUNTER — Inpatient Hospital Stay (HOSPITAL_COMMUNITY): Payer: Medicare Other | Admitting: Emergency Medicine

## 2014-10-26 ENCOUNTER — Inpatient Hospital Stay (HOSPITAL_COMMUNITY): Payer: Medicare Other | Admitting: Anesthesiology

## 2014-10-26 ENCOUNTER — Encounter (HOSPITAL_COMMUNITY): Payer: Self-pay | Admitting: *Deleted

## 2014-10-26 DIAGNOSIS — M838 Other adult osteomalacia: Secondary | ICD-10-CM | POA: Diagnosis present

## 2014-10-26 DIAGNOSIS — I10 Essential (primary) hypertension: Secondary | ICD-10-CM | POA: Diagnosis present

## 2014-10-26 DIAGNOSIS — E785 Hyperlipidemia, unspecified: Secondary | ICD-10-CM | POA: Diagnosis present

## 2014-10-26 DIAGNOSIS — D62 Acute posthemorrhagic anemia: Secondary | ICD-10-CM | POA: Diagnosis not present

## 2014-10-26 DIAGNOSIS — M810 Age-related osteoporosis without current pathological fracture: Secondary | ICD-10-CM | POA: Diagnosis present

## 2014-10-26 DIAGNOSIS — Z79899 Other long term (current) drug therapy: Secondary | ICD-10-CM

## 2014-10-26 DIAGNOSIS — Z96651 Presence of right artificial knee joint: Secondary | ICD-10-CM | POA: Diagnosis present

## 2014-10-26 DIAGNOSIS — Z952 Presence of prosthetic heart valve: Secondary | ICD-10-CM

## 2014-10-26 DIAGNOSIS — K219 Gastro-esophageal reflux disease without esophagitis: Secondary | ICD-10-CM | POA: Diagnosis present

## 2014-10-26 DIAGNOSIS — Z7982 Long term (current) use of aspirin: Secondary | ICD-10-CM

## 2014-10-26 DIAGNOSIS — E876 Hypokalemia: Secondary | ICD-10-CM | POA: Diagnosis not present

## 2014-10-26 DIAGNOSIS — M1712 Unilateral primary osteoarthritis, left knee: Principal | ICD-10-CM

## 2014-10-26 DIAGNOSIS — M25562 Pain in left knee: Secondary | ICD-10-CM | POA: Diagnosis present

## 2014-10-26 DIAGNOSIS — Z87891 Personal history of nicotine dependence: Secondary | ICD-10-CM

## 2014-10-26 HISTORY — PX: TOTAL KNEE ARTHROPLASTY: SHX125

## 2014-10-26 LAB — POCT I-STAT 4, (NA,K, GLUC, HGB,HCT)
Glucose, Bld: 88 mg/dL (ref 70–99)
HEMATOCRIT: 39 % (ref 36.0–46.0)
Hemoglobin: 13.3 g/dL (ref 12.0–15.0)
Potassium: 3.7 mEq/L (ref 3.7–5.3)
SODIUM: 142 meq/L (ref 137–147)

## 2014-10-26 SURGERY — ARTHROPLASTY, KNEE, TOTAL
Anesthesia: Spinal | Site: Knee | Laterality: Left

## 2014-10-26 MED ORDER — LACTATED RINGERS IV SOLN
INTRAVENOUS | Status: DC | PRN
  Administered 2014-10-26 (×2): via INTRAVENOUS

## 2014-10-26 MED ORDER — TRANEXAMIC ACID 100 MG/ML IV SOLN
1000.0000 mg | INTRAVENOUS | Status: DC
  Filled 2014-10-26: qty 10

## 2014-10-26 MED ORDER — ONDANSETRON HCL 4 MG/2ML IJ SOLN
INTRAMUSCULAR | Status: AC
  Filled 2014-10-26: qty 2

## 2014-10-26 MED ORDER — PROPOFOL INFUSION 10 MG/ML OPTIME
INTRAVENOUS | Status: DC | PRN
  Administered 2014-10-26: 50 ug/kg/min via INTRAVENOUS
  Administered 2014-10-26: 60 ug/kg/min via INTRAVENOUS

## 2014-10-26 MED ORDER — LISINOPRIL-HYDROCHLOROTHIAZIDE 20-12.5 MG PO TABS
1.0000 | ORAL_TABLET | Freq: Every day | ORAL | Status: DC
Start: 2014-10-26 — End: 2014-10-26

## 2014-10-26 MED ORDER — ONDANSETRON HCL 4 MG/2ML IJ SOLN: 4.0000 mg | Freq: Four times a day (QID) | INTRAMUSCULAR | Status: DC | PRN

## 2014-10-26 MED ORDER — PROPOFOL 10 MG/ML IV BOLUS
INTRAVENOUS | Status: DC | PRN
  Administered 2014-10-26: 20 mg via INTRAVENOUS

## 2014-10-26 MED ORDER — SODIUM CHLORIDE 0.9 % IV SOLN
INTRAVENOUS | Status: DC
  Administered 2014-10-26 – 2014-10-27 (×2): via INTRAVENOUS

## 2014-10-26 MED ORDER — HYDROMORPHONE HCL 1 MG/ML IJ SOLN
0.5000 mg | INTRAMUSCULAR | Status: DC | PRN
Start: 2014-10-26 — End: 2014-10-28
  Administered 2014-10-26: 0.5 mg via INTRAVENOUS
  Filled 2014-10-26: qty 1

## 2014-10-26 MED ORDER — BISACODYL 5 MG PO TBEC: 5.0000 mg | DELAYED_RELEASE_TABLET | Freq: Every day | ORAL | Status: DC | PRN

## 2014-10-26 MED ORDER — MIDAZOLAM HCL 2 MG/2ML IJ SOLN
INTRAMUSCULAR | Status: AC
  Filled 2014-10-26: qty 2

## 2014-10-26 MED ORDER — METOPROLOL TARTRATE 25 MG PO TABS
25.0000 mg | ORAL_TABLET | Freq: Every day | ORAL | Status: DC
  Administered 2014-10-27 – 2014-10-28 (×2): 25 mg via ORAL
  Filled 2014-10-26 (×3): qty 1

## 2014-10-26 MED ORDER — PROPOFOL 10 MG/ML IV EMUL
INTRAVENOUS | Status: AC
  Filled 2014-10-26: qty 200

## 2014-10-26 MED ORDER — DICYCLOMINE HCL 10 MG PO CAPS
10.0000 mg | ORAL_CAPSULE | Freq: Every day | ORAL | Status: DC
  Administered 2014-10-27: 10 mg via ORAL
  Filled 2014-10-26 (×3): qty 1

## 2014-10-26 MED ORDER — LIDOCAINE HCL (CARDIAC) 20 MG/ML IV SOLN
INTRAVENOUS | Status: AC
  Filled 2014-10-26: qty 5

## 2014-10-26 MED ORDER — DOCUSATE SODIUM 100 MG PO CAPS
100.0000 mg | ORAL_CAPSULE | Freq: Two times a day (BID) | ORAL | Status: DC
  Administered 2014-10-26 – 2014-10-28 (×3): 100 mg via ORAL
  Filled 2014-10-26 (×6): qty 1

## 2014-10-26 MED ORDER — BUPIVACAINE HCL (PF) 0.25 % IJ SOLN
INTRAMUSCULAR | Status: AC
  Filled 2014-10-26: qty 30

## 2014-10-26 MED ORDER — HYDROCHLOROTHIAZIDE 12.5 MG PO CAPS
12.5000 mg | ORAL_CAPSULE | Freq: Every day | ORAL | Status: DC
  Administered 2014-10-27 – 2014-10-28 (×2): 12.5 mg via ORAL
  Filled 2014-10-26 (×4): qty 1

## 2014-10-26 MED ORDER — OXYCODONE-ACETAMINOPHEN 5-325 MG PO TABS
1.0000 | ORAL_TABLET | ORAL | Status: DC | PRN
  Administered 2014-10-26: 2 via ORAL
  Administered 2014-10-27: 1 via ORAL
  Administered 2014-10-27 (×3): 2 via ORAL
  Administered 2014-10-28: 1 via ORAL
  Filled 2014-10-26 (×2): qty 1
  Filled 2014-10-26: qty 2
  Filled 2014-10-26: qty 1
  Filled 2014-10-26 (×4): qty 2

## 2014-10-26 MED ORDER — METHOCARBAMOL 1000 MG/10ML IJ SOLN
500.0000 mg | Freq: Four times a day (QID) | INTRAVENOUS | Status: DC | PRN
  Filled 2014-10-26: qty 5

## 2014-10-26 MED ORDER — POLYETHYL GLYCOL-PROPYL GLYCOL 0.4-0.3 % OP SOLN: Freq: Every day | OPHTHALMIC | Status: DC

## 2014-10-26 MED ORDER — PROPOFOL 10 MG/ML IV BOLUS
INTRAVENOUS | Status: AC
  Filled 2014-10-26: qty 20

## 2014-10-26 MED ORDER — SODIUM CHLORIDE 0.9 % IR SOLN
Status: DC | PRN
  Administered 2014-10-26: 3000 mL

## 2014-10-26 MED ORDER — HYDROMORPHONE HCL 1 MG/ML IJ SOLN: 0.2500 mg | INTRAMUSCULAR | Status: DC | PRN

## 2014-10-26 MED ORDER — FENTANYL CITRATE 0.05 MG/ML IJ SOLN
INTRAMUSCULAR | Status: AC
  Filled 2014-10-26: qty 5

## 2014-10-26 MED ORDER — METHOCARBAMOL 500 MG PO TABS: 500.0000 mg | ORAL_TABLET | Freq: Three times a day (TID) | ORAL | Status: DC | PRN

## 2014-10-26 MED ORDER — DILTIAZEM HCL ER 240 MG PO CP24
240.0000 mg | ORAL_CAPSULE | Freq: Every day | ORAL | Status: DC
  Administered 2014-10-27 – 2014-10-28 (×2): 240 mg via ORAL
  Filled 2014-10-26 (×2): qty 1

## 2014-10-26 MED ORDER — CEFUROXIME SODIUM 1.5 G IJ SOLR
INTRAMUSCULAR | Status: AC
  Filled 2014-10-26: qty 1.5

## 2014-10-26 MED ORDER — MIDAZOLAM HCL 2 MG/2ML IJ SOLN
1.0000 mg | INTRAMUSCULAR | Status: DC | PRN
  Filled 2014-10-26: qty 2

## 2014-10-26 MED ORDER — DIPHENHYDRAMINE HCL 12.5 MG/5ML PO ELIX: 12.5000 mg | ORAL_SOLUTION | ORAL | Status: DC | PRN

## 2014-10-26 MED ORDER — CEFAZOLIN SODIUM-DEXTROSE 2-3 GM-% IV SOLR
2.0000 g | Freq: Four times a day (QID) | INTRAVENOUS | Status: AC
  Administered 2014-10-26 (×2): 2 g via INTRAVENOUS
  Filled 2014-10-26 (×2): qty 50

## 2014-10-26 MED ORDER — LACTATED RINGERS IV SOLN
INTRAVENOUS | Status: DC
  Administered 2014-10-26: 12:00:00 via INTRAVENOUS

## 2014-10-26 MED ORDER — ASPIRIN EC 325 MG PO TBEC: 325.0000 mg | DELAYED_RELEASE_TABLET | Freq: Two times a day (BID) | ORAL | Status: DC

## 2014-10-26 MED ORDER — ALUM & MAG HYDROXIDE-SIMETH 200-200-20 MG/5ML PO SUSP: 30.0000 mL | ORAL | Status: DC | PRN

## 2014-10-26 MED ORDER — TRANEXAMIC ACID 100 MG/ML IV SOLN
2000.0000 mg | INTRAVENOUS | Status: AC
  Administered 2014-10-26: 2000 mg via TOPICAL
  Filled 2014-10-26: qty 20

## 2014-10-26 MED ORDER — ONDANSETRON HCL 4 MG PO TABS: 4.0000 mg | ORAL_TABLET | Freq: Four times a day (QID) | ORAL | Status: DC | PRN

## 2014-10-26 MED ORDER — LISINOPRIL 20 MG PO TABS
20.0000 mg | ORAL_TABLET | Freq: Every day | ORAL | Status: DC
  Administered 2014-10-27 – 2014-10-28 (×2): 20 mg via ORAL
  Filled 2014-10-26 (×4): qty 1

## 2014-10-26 MED ORDER — FENTANYL CITRATE 0.05 MG/ML IJ SOLN
50.0000 ug | INTRAMUSCULAR | Status: DC | PRN
  Filled 2014-10-26: qty 2

## 2014-10-26 MED ORDER — BUPIVACAINE HCL (PF) 0.25 % IJ SOLN
INTRAMUSCULAR | Status: DC | PRN
  Administered 2014-10-26: 20 mL

## 2014-10-26 MED ORDER — BUPIVACAINE HCL (PF) 0.75 % IJ SOLN
INTRAMUSCULAR | Status: DC | PRN
  Administered 2014-10-26: 2 mL via INTRATHECAL

## 2014-10-26 MED ORDER — PROMETHAZINE HCL 25 MG/ML IJ SOLN: 6.2500 mg | Freq: Four times a day (QID) | INTRAMUSCULAR | Status: DC | PRN

## 2014-10-26 MED ORDER — ONDANSETRON HCL 4 MG/2ML IJ SOLN
INTRAMUSCULAR | Status: DC | PRN
  Administered 2014-10-26: 4 mg via INTRAVENOUS

## 2014-10-26 MED ORDER — BUPIVACAINE LIPOSOME 1.3 % IJ SUSP
20.0000 mL | Freq: Once | INTRAMUSCULAR | Status: AC
  Administered 2014-10-26: 20 mL
  Filled 2014-10-26: qty 20

## 2014-10-26 MED ORDER — LIDOCAINE HCL (CARDIAC) 20 MG/ML IV SOLN
INTRAVENOUS | Status: DC | PRN
  Administered 2014-10-26: 30 mg via INTRAVENOUS

## 2014-10-26 MED ORDER — PANTOPRAZOLE SODIUM 40 MG PO TBEC
40.0000 mg | DELAYED_RELEASE_TABLET | Freq: Every day | ORAL | Status: DC
  Administered 2014-10-27 – 2014-10-28 (×2): 40 mg via ORAL
  Filled 2014-10-26 (×3): qty 1

## 2014-10-26 MED ORDER — ACETAMINOPHEN 650 MG RE SUPP: 650.0000 mg | Freq: Four times a day (QID) | RECTAL | Status: DC | PRN

## 2014-10-26 MED ORDER — ZOLPIDEM TARTRATE 5 MG PO TABS
5.0000 mg | ORAL_TABLET | Freq: Every evening | ORAL | Status: DC | PRN
Start: 2014-10-26 — End: 2014-10-28

## 2014-10-26 MED ORDER — ACETAMINOPHEN 325 MG PO TABS: 650.0000 mg | ORAL_TABLET | Freq: Four times a day (QID) | ORAL | Status: DC | PRN

## 2014-10-26 MED ORDER — FLEET ENEMA 7-19 GM/118ML RE ENEM
1.0000 | ENEMA | Freq: Once | RECTAL | Status: AC | PRN
Start: 2014-10-26 — End: 2014-10-26

## 2014-10-26 MED ORDER — SODIUM CHLORIDE 0.9 % IR SOLN
Status: DC | PRN
  Administered 2014-10-26: 1000 mL

## 2014-10-26 MED ORDER — POLYETHYLENE GLYCOL 3350 17 G PO PACK: 17.0000 g | PACK | Freq: Every day | ORAL | Status: DC | PRN

## 2014-10-26 MED ORDER — ASPIRIN EC 325 MG PO TBEC
325.0000 mg | DELAYED_RELEASE_TABLET | Freq: Two times a day (BID) | ORAL | Status: DC
  Administered 2014-10-27 – 2014-10-28 (×2): 325 mg via ORAL
  Filled 2014-10-26 (×6): qty 1

## 2014-10-26 MED ORDER — FENTANYL CITRATE 0.05 MG/ML IJ SOLN
INTRAMUSCULAR | Status: DC | PRN
  Administered 2014-10-26: 50 ug via INTRAVENOUS

## 2014-10-26 MED ORDER — SIMVASTATIN 20 MG PO TABS
20.0000 mg | ORAL_TABLET | Freq: Every day | ORAL | Status: DC
  Filled 2014-10-26 (×2): qty 1

## 2014-10-26 MED ORDER — OXYCODONE-ACETAMINOPHEN 5-325 MG PO TABS: 1.0000 | ORAL_TABLET | Freq: Four times a day (QID) | ORAL | Status: DC | PRN

## 2014-10-26 MED ORDER — METHOCARBAMOL 500 MG PO TABS
500.0000 mg | ORAL_TABLET | Freq: Four times a day (QID) | ORAL | Status: DC | PRN
  Administered 2014-10-26 – 2014-10-27 (×2): 500 mg via ORAL
  Filled 2014-10-26 (×4): qty 1

## 2014-10-26 MED ORDER — POLYVINYL ALCOHOL 1.4 % OP SOLN
1.0000 [drp] | Freq: Every day | OPHTHALMIC | Status: DC
  Administered 2014-10-26 – 2014-10-28 (×3): 1 [drp] via OPHTHALMIC
  Filled 2014-10-26: qty 15

## 2014-10-26 SURGICAL SUPPLY — 70 items
APL SKNCLS STERI-STRIP NONHPOA (GAUZE/BANDAGES/DRESSINGS)
AUG TIB SZ2.5 10 REV STP WDG (Knees) ×2 IMPLANT
BANDAGE ESMARK 6X9 LF (GAUZE/BANDAGES/DRESSINGS) ×1 IMPLANT
BENZOIN TINCTURE PRP APPL 2/3 (GAUZE/BANDAGES/DRESSINGS) ×1 IMPLANT
BLADE SAGITTAL 25.0X1.19X90 (BLADE) ×2 IMPLANT
BLADE SAGITTAL 25.0X1.19X90MM (BLADE) ×1
BLADE SAW SAG 90X13X1.27 (BLADE) ×3 IMPLANT
BNDG CMPR 9X6 STRL LF SNTH (GAUZE/BANDAGES/DRESSINGS) ×1
BNDG ESMARK 6X9 LF (GAUZE/BANDAGES/DRESSINGS) ×3
BOWL SMART MIX CTS (DISPOSABLE) ×3 IMPLANT
CAP UPCHARGE REVISION TRAY ×2 IMPLANT
CAPT RP KNEE ×2 IMPLANT
CEMENT HV SMART SET (Cement) ×6 IMPLANT
CLOSURE WOUND 1/2 X4 (GAUZE/BANDAGES/DRESSINGS)
COVER SURGICAL LIGHT HANDLE (MISCELLANEOUS) ×3 IMPLANT
CUFF TOURNIQUET SINGLE 34IN LL (TOURNIQUET CUFF) ×3 IMPLANT
CUFF TOURNIQUET SINGLE 44IN (TOURNIQUET CUFF) IMPLANT
DRAPE EXTREMITY T 121X128X90 (DRAPE) ×3 IMPLANT
DRAPE IMP U-DRAPE 54X76 (DRAPES) ×3 IMPLANT
DRAPE U-SHAPE 47X51 STRL (DRAPES) ×3 IMPLANT
DRSG MEPILEX BORDER 4X8 (GAUZE/BANDAGES/DRESSINGS) ×2 IMPLANT
DRSG PAD ABDOMINAL 8X10 ST (GAUZE/BANDAGES/DRESSINGS) ×1 IMPLANT
DURAPREP 26ML APPLICATOR (WOUND CARE) ×3 IMPLANT
ELECT REM PT RETURN 9FT ADLT (ELECTROSURGICAL) ×3
ELECTRODE REM PT RTRN 9FT ADLT (ELECTROSURGICAL) ×1 IMPLANT
EVACUATOR 1/8 PVC DRAIN (DRAIN) ×3 IMPLANT
FACESHIELD WRAPAROUND (MASK) IMPLANT
FACESHIELD WRAPAROUND OR TEAM (MASK) ×1 IMPLANT
GAUZE SPONGE 4X4 12PLY STRL (GAUZE/BANDAGES/DRESSINGS) ×3 IMPLANT
GAUZE XEROFORM 5X9 LF (GAUZE/BANDAGES/DRESSINGS) ×1 IMPLANT
GLOVE BIOGEL PI IND STRL 8 (GLOVE) ×2 IMPLANT
GLOVE BIOGEL PI INDICATOR 8 (GLOVE) ×4
GLOVE ECLIPSE 7.5 STRL STRAW (GLOVE) ×6 IMPLANT
GOWN STRL REUS W/ TWL LRG LVL3 (GOWN DISPOSABLE) ×1 IMPLANT
GOWN STRL REUS W/ TWL XL LVL3 (GOWN DISPOSABLE) ×2 IMPLANT
GOWN STRL REUS W/TWL LRG LVL3 (GOWN DISPOSABLE) ×3
GOWN STRL REUS W/TWL XL LVL3 (GOWN DISPOSABLE) ×6
HANDPIECE INTERPULSE COAX TIP (DISPOSABLE) ×3
HOOD PEEL AWAY FACE SHEILD DIS (HOOD) ×7 IMPLANT
IMMOBILIZER KNEE 20 (SOFTGOODS) IMPLANT
IMMOBILIZER KNEE 22 UNIV (SOFTGOODS) ×3 IMPLANT
KIT BASIN OR (CUSTOM PROCEDURE TRAY) ×3 IMPLANT
KIT ROOM TURNOVER OR (KITS) ×3 IMPLANT
MANIFOLD NEPTUNE II (INSTRUMENTS) ×3 IMPLANT
NDL HYPO 25GX1X1/2 BEV (NEEDLE) IMPLANT
NEEDLE 22X1 1/2 (OR ONLY) (NEEDLE) ×2 IMPLANT
NEEDLE HYPO 25GX1X1/2 BEV (NEEDLE) IMPLANT
NS IRRIG 1000ML POUR BTL (IV SOLUTION) ×3 IMPLANT
PACK TOTAL JOINT (CUSTOM PROCEDURE TRAY) ×3 IMPLANT
PACK UNIVERSAL I (CUSTOM PROCEDURE TRAY) ×3 IMPLANT
PAD ARMBOARD 7.5X6 YLW CONV (MISCELLANEOUS) ×6 IMPLANT
PAD CAST 4YDX4 CTTN HI CHSV (CAST SUPPLIES) ×1 IMPLANT
PADDING CAST COTTON 4X4 STRL (CAST SUPPLIES) ×3
PADDING CAST COTTON 6X4 STRL (CAST SUPPLIES) ×2 IMPLANT
SET HNDPC FAN SPRY TIP SCT (DISPOSABLE) ×1 IMPLANT
STAPLER VISISTAT 35W (STAPLE) ×2 IMPLANT
STRIP CLOSURE SKIN 1/2X4 (GAUZE/BANDAGES/DRESSINGS) ×1 IMPLANT
SUCTION FRAZIER TIP 10 FR DISP (SUCTIONS) ×3 IMPLANT
SUT MNCRL AB 3-0 PS2 18 (SUTURE) IMPLANT
SUT VIC AB 0 CTB1 27 (SUTURE) ×6 IMPLANT
SUT VIC AB 1 CT1 27 (SUTURE) ×6
SUT VIC AB 1 CT1 27XBRD ANBCTR (SUTURE) ×2 IMPLANT
SUT VIC AB 2-0 CTB1 (SUTURE) ×6 IMPLANT
SYR 50ML LL SCALE MARK (SYRINGE) ×3 IMPLANT
SYR CONTROL 10ML LL (SYRINGE) IMPLANT
TOWEL OR 17X24 6PK STRL BLUE (TOWEL DISPOSABLE) ×3 IMPLANT
TOWEL OR 17X26 10 PK STRL BLUE (TOWEL DISPOSABLE) ×3 IMPLANT
TRAY FOLEY CATH 16FRSI W/METER (SET/KITS/TRAYS/PACK) ×1 IMPLANT
WATER STERILE IRR 1000ML POUR (IV SOLUTION) ×2 IMPLANT
WEDGE STEP 2.5 10MM (Knees) ×4 IMPLANT

## 2014-10-26 NOTE — Plan of Care (Signed)
Problem: Phase I Progression Outcomes Goal: Pain controlled with appropriate interventions Outcome: Completed/Met Date Met:  10/26/14 Goal: Dangle or out of bed evening of surgery Outcome: Completed/Met Date Met:  10/26/14 Goal: Hemodynamically stable Outcome: Completed/Met Date Met:  10/26/14  Problem: Phase II Progression Outcomes Goal: Ambulates Outcome: Completed/Met Date Met:  10/26/14     

## 2014-10-26 NOTE — Discharge Instructions (Signed)
Total Knee Replacement, Care After °Refer to this sheet in the next few weeks. These instructions provide you with information on caring for yourself after your procedure. Your health care provider also may give you specific instructions. Your treatment has been planned according to the most current medical practices, but problems sometimes occur. Call your health care provider if you have any problems or questions after your procedure. °HOME CARE INSTRUCTIONS  °· See a physical therapist as directed by your health care provider. °· Take medicines only as directed by your health care provider. °· Avoid lifting or driving until you are instructed otherwise. °· If you have been sent home with a continuous passive motion machine, use it as directed by your health care provider. °SEEK MEDICAL CARE IF: °· You have difficulty breathing. °· You have drainage, redness, swelling, or pain at your incision site. °· You have a bad smell coming from your incision site. °· You have persistent bleeding from your incision site. °· Your incision breaks open after sutures (stitches) or staples have been removed. °· You have a fever. °SEEK IMMEDIATE MEDICAL CARE IF:  °· You have a rash. °· You have pain or swelling in your calf or thigh. °· You have shortness of breath or chest pain. °· Your range of motion in your knee is decreasing rather than increasing. °MAKE SURE YOU:  °· Understand these instructions. °· Will watch your condition. °· Will get help right away if you are not doing well or get worse. °Document Released: 06/21/2005 Document Revised: 04/18/2014 Document Reviewed: 01/21/2012 °Exit[employer]® Patient Information ©2015 [employer]. This information is not intended to replace advice given to you by your health care provider. Make sure you discuss any questions you have with your health care provider. ° °

## 2014-10-26 NOTE — H&P (Addendum)
TOTAL KNEE ADMISSION H&P  Patient is being admitted for left total knee arthroplasty.  Subjective:  Chief Complaint:left knee pain.  HPI: [name], 80 y.o. female, has a history of pain and functional disability in the left knee due to arthritis and has failed non-surgical conservative treatments for greater than 12 weeks to includeNSAID's and/or analgesics, corticosteriod injections, viscosupplementation injections, use of assistive devices, weight reduction as appropriate and activity modification.  Onset of symptoms was gradual, starting 5 years ago with gradually worsening course since that time. The patient noted no past surgery on the left knee(s).  Patient currently rates pain in the left knee(s) at 8 out of 10 with activity. Patient has night pain, worsening of pain with activity and weight bearing, pain that interferes with activities of daily living, pain with passive range of motion, crepitus and joint swelling.  Patient has evidence of subchondral cysts, subchondral sclerosis and joint space narrowing by imaging studies. This patient has had failure of conservative care. There is no active infection.  Patient Active Problem List   Diagnosis Date Noted  . Osteoarthritis of right knee 06/03/2014   Past Medical History  Diagnosis Date  . Family history of anesthesia complication     Daughter; takes a long time to wake up  . Hypertension   . Shortness of breath     while walking  . Frequency of urination     at time  . GERD (gastroesophageal reflux disease)   . Arthritis   . Hyperlipemia     Past Surgical History  Procedure Laterality Date  . Mitral valve repair    . Abdominal hysterectomy    . Eye surgery Bilateral     cataract removed; lens implant  . Total knee arthroplasty Right 06/03/2014    Procedure: RIGHT TOTAL KNEE ARTHROPLASTY ;  Surgeon: Alta Corning, MD;  Location: [facility];  Service: Orthopedics;  Laterality: Right;    Prescriptions prior to admission   Medication Sig Dispense Refill Last Dose  . acetaminophen (TYLENOL) 650 MG CR tablet Take 650 mg by mouth daily as needed for pain.    10/25/2014 at 2200  . Ascorbic Acid (VITAMIN C PO) Take 1 tablet by mouth daily.   10/26/2014 at 0830  . dicyclomine (BENTYL) 10 MG capsule Take 10 mg by mouth daily before lunch.   10/26/2014 at 0830  . diltiazem (DILACOR XR) 240 MG 24 hr capsule Take 240 mg by mouth daily.   10/26/2014 at 0830  . lisinopril-hydrochlorothiazide (PRINZIDE,ZESTORETIC) 20-12.5 MG per tablet Take 1 tablet by mouth daily.   10/26/2014 at 0830  . metoprolol tartrate (LOPRESSOR) 25 MG tablet Take 25 mg by mouth daily.   10/26/2014 at 0830  . omeprazole (PRILOSEC) 20 MG capsule Take 20 mg by mouth daily.   10/26/2014 at 0830  . Oxymetazoline HCl (AFRIN NASAL SPRAY NA) Place 2-3 sprays into both nostrils daily as needed (congestion).    10/25/2014 at 2200  . Polyethyl Glycol-Propyl Glycol (SYSTANE OP) Place 2 drops into both eyes daily.    Past Week at Unknown time  . simvastatin (ZOCOR) 20 MG tablet Take 20 mg by mouth daily.   10/26/2014 at 0830   No Known Allergies  History  Substance Use Topics  . Smoking status: Former Research scientist (life sciences)  . Smokeless tobacco: Not on file  . Alcohol Use: No    History reviewed. No pertinent family history.   ROS ROS: I have reviewed the patient's review of systems thoroughly and there  are no positive responses as relates to the HPI. Objective:  Physical Exam  Vital signs in last 24 hours: Temp:  [98.3 F (36.8 C)] 98.3 F (36.8 C) (11/11 1046) Pulse Rate:  [60] 60 (11/11 1046) Resp:  [18] 18 (11/11 1046) BP: (175)/(94) 175/94 mmHg (11/11 1047) SpO2:  [100 %] 100 % (11/11 1046) Weight:  [136 lb (61.689 kg)] 136 lb (61.689 kg) (11/11 1046) Well-developed well-nourished patient in no acute distress. Alert and oriented x3 HEENT:within normal limits Cardiac: Regular rate and rhythm Pulmonary: Lungs clear to auscultation Abdomen: Soft and  nontender.  Normal active bowel sounds  Musculoskeletal: (r knee: painful rom// rom0-90 deg/ no instability Labs: Recent Results (from the past 2160 hour(s))  Surgical pcr screen     Status: None   Collection Time: 10/18/14 10:21 AM  Result Value Ref Range   MRSA, PCR NEGATIVE NEGATIVE   Staphylococcus aureus NEGATIVE NEGATIVE    Comment:        The Xpert SA Assay (FDA approved for NASAL specimens in patients over 38 years of age), is one component of a comprehensive surveillance program.  Test performance has been validated by [employer] for patients greater than or equal to 35 year old. It is not intended to diagnose infection nor to guide or monitor treatment.   Urinalysis, Routine w reflex microscopic     Status: Abnormal   Collection Time: 10/18/14 10:22 AM  Result Value Ref Range   Color, Urine YELLOW YELLOW   APPearance CLOUDY (A) CLEAR   Specific Gravity, Urine 1.024 1.005 - 1.030   pH 6.0 5.0 - 8.0   Glucose, UA NEGATIVE NEGATIVE mg/dL   Hgb urine dipstick NEGATIVE NEGATIVE   Bilirubin Urine NEGATIVE NEGATIVE   Ketones, ur NEGATIVE NEGATIVE mg/dL   Protein, ur NEGATIVE NEGATIVE mg/dL   Urobilinogen, UA 0.2 0.0 - 1.0 mg/dL   Nitrite NEGATIVE NEGATIVE   Leukocytes, UA TRACE (A) NEGATIVE  Urine microscopic-add on     Status: Abnormal   Collection Time: 10/18/14 10:22 AM  Result Value Ref Range   Squamous Epithelial / LPF FEW (A) RARE   WBC, UA 0-2 <3 WBC/hpf   Bacteria, UA RARE RARE  APTT     Status: None   Collection Time: 10/18/14 10:39 AM  Result Value Ref Range   aPTT 31 24 - 37 seconds  CBC WITH DIFFERENTIAL     Status: Abnormal   Collection Time: 10/18/14 10:39 AM  Result Value Ref Range   WBC 5.1 4.0 - 10.5 K/uL   RBC 4.63 3.87 - 5.11 MIL/uL   Hemoglobin 11.8 (L) 12.0 - 15.0 g/dL   HCT 36.4 36.0 - 46.0 %   MCV 78.6 78.0 - 100.0 fL   MCH 25.5 (L) 26.0 - 34.0 pg   MCHC 32.4 30.0 - 36.0 g/dL   RDW 19.2 (H) 11.5 - 15.5 %   Platelets 234 150 - 400  K/uL   Neutrophils Relative % 51 43 - 77 %   Neutro Abs 2.6 1.7 - 7.7 K/uL   Lymphocytes Relative 39 12 - 46 %   Lymphs Abs 2.0 0.7 - 4.0 K/uL   Monocytes Relative 9 3 - 12 %   Monocytes Absolute 0.5 0.1 - 1.0 K/uL   Eosinophils Relative 1 0 - 5 %   Eosinophils Absolute 0.1 0.0 - 0.7 K/uL   Basophils Relative 0 0 - 1 %   Basophils Absolute 0.0 0.0 - 0.1 K/uL  Comprehensive metabolic panel  Status: Abnormal   Collection Time: 10/18/14 10:39 AM  Result Value Ref Range   Sodium 143 137 - 147 mEq/L   Potassium 2.6 (LL) 3.7 - 5.3 mEq/L    Comment: CRITICAL RESULT CALLED TO, READ BACK BY AND VERIFIED WITH: K.TEMPLES,RN 1143 10/18/14 CLARK,S    Chloride 99 96 - 112 mEq/L   CO2 28 19 - 32 mEq/L   Glucose, Bld 97 70 - 99 mg/dL   BUN 13 6 - 23 mg/dL   Creatinine, Ser 0.92 0.50 - 1.10 mg/dL   Calcium 9.3 8.4 - 10.5 mg/dL   Total Protein 7.3 6.0 - 8.3 g/dL   Albumin 3.8 3.5 - 5.2 g/dL   AST 13 0 - 37 U/L   ALT 5 0 - 35 U/L   Alkaline Phosphatase 69 39 - 117 U/L   Total Bilirubin 0.4 0.3 - 1.2 mg/dL   GFR calc non Af Amer 59 (L) >90 mL/min   GFR calc Af Amer 68 (L) >90 mL/min    Comment: (NOTE) The eGFR has been calculated using the CKD EPI equation. This calculation has not been validated in all clinical situations. eGFR's persistently <90 mL/min signify possible Chronic Kidney Disease.    Anion gap 16 (H) 5 - 15  Protime-INR     Status: None   Collection Time: 10/18/14 10:39 AM  Result Value Ref Range   Prothrombin Time 14.0 11.6 - 15.2 seconds   INR 1.07 0.00 - 1.49  Type and screen     Status: None   Collection Time: 10/18/14 10:39 AM  Result Value Ref Range   ABO/RH(D) O POS    Antibody Screen NEG    Sample Expiration 11/01/2014   I-STAT 4, (NA,K, GLUC, HGB,HCT)     Status: None   Collection Time: 10/26/14 11:19 AM  Result Value Ref Range   Sodium 142 137 - 147 mEq/L   Potassium 3.7 3.7 - 5.3 mEq/L   Glucose, Bld 88 70 - 99 mg/dL   HCT 39.0 36.0 - 46.0 %    Hemoglobin 13.3 12.0 - 15.0 g/dL    Estimated body mass index is 27.45 kg/(m^2) as calculated from the following:   Height as of 10/18/14: 4' 11"  (1.499 m).   Weight as of this encounter: 136 lb (61.689 kg).   Imaging Review Plain radiographs demonstrate severe degenerative joint disease of the left knee(s). The overall alignment ismild varus. The bone quality appears to be good for age and reported activity level.  Assessment/Plan:  End stage arthritis, left knee   The patient history, physical examination, clinical judgment of the provider and imaging studies are consistent with end stage degenerative joint disease of the left knee(s) and total knee arthroplasty is deemed medically necessary. The treatment options including medical management, injection therapy arthroscopy and arthroplasty were discussed at length. The risks and benefits of total knee arthroplasty were presented and reviewed. The risks due to aseptic loosening, infection, stiffness, patella tracking problems, thromboembolic complications and other imponderables were discussed. The patient acknowledged the explanation, agreed to proceed with the plan and consent was signed. Patient is being admitted for inpatient treatment for surgery, pain control, PT, OT, prophylactic antibiotics, VTE prophylaxis, progressive ambulation and ADL's and discharge planning. The patient is planning to be discharged home with home health services

## 2014-10-26 NOTE — Anesthesia Preprocedure Evaluation (Addendum)
Anesthesia Evaluation  Patient identified by MRN, date of birth, ID band Patient awake    Reviewed: Allergy & Precautions, H&P , NPO status , Patient's Chart, lab work & pertinent test results, reviewed documented beta blocker date and time   Airway Mallampati: II  TM Distance: >3 FB Neck ROM: Full    Dental no notable dental hx. (+) Edentulous Upper, Edentulous Lower, Dental Advisory Given   Pulmonary neg pulmonary ROS, former smoker,  breath sounds clear to auscultation  Pulmonary exam normal       Cardiovascular hypertension, Pt. on medications + Valvular Problems/Murmurs Rhythm:Regular Rate:Normal  S/p MVR   Neuro/Psych negative neurological ROS  negative psych ROS   GI/Hepatic Neg liver ROS, GERD-  Medicated and Controlled,  Endo/Other  negative endocrine ROS  Renal/GU negative Renal ROS  negative genitourinary   Musculoskeletal  (+) Arthritis -, Osteoarthritis,    Abdominal   Peds  Hematology negative hematology ROS (+)   Anesthesia Other Findings   Reproductive/Obstetrics negative OB ROS                           Anesthesia Physical Anesthesia Plan  ASA: II  Anesthesia Plan: Spinal   Post-op Pain Management:    Induction: Intravenous  Airway Management Planned: Simple Face Mask  Additional Equipment:   Intra-op Plan:   Post-operative Plan:   Informed Consent: I have reviewed the patients History and Physical, chart, labs and discussed the procedure including the risks, benefits and alternatives for the proposed anesthesia with the patient or authorized representative who has indicated his/her understanding and acceptance.   Dental advisory given  Plan Discussed with: CRNA and Surgeon  Anesthesia Plan Comments:        Anesthesia Quick Evaluation

## 2014-10-26 NOTE — Progress Notes (Signed)
Orthopedic Tech Progress Note Patient Details:  Janet GrumblesSusan A [name] May 30, 1934 [id number] Applied CPM to LLE. CPM Left Knee CPM Left Knee: On Left Knee Flexion (Degrees): 60 Left Knee Extension (Degrees): 0   Lesle ChrisGilliland, Avalynne Diver L 10/26/2014, 4:59 PM

## 2014-10-26 NOTE — Brief Op Note (Signed)
10/26/2014  3:56 PM  PATIENT:  Estelle GrumblesSusan A Padmore  80 y.o. female  PRE-OPERATIVE DIAGNOSIS:  DJD LEFT KNEE   POST-OPERATIVE DIAGNOSIS:  DJD LEFT KNEE  PROCEDURE:  Procedure(s): LEFT TOTAL KNEE ARTHROPLASTY (Left)  SURGEON:  Surgeon(s) and Role:    * Harvie JuniorJohn L Asianae Minkler, MD - Primary  PHYSICIAN ASSISTANT:   ASSISTANTS: bethune   ANESTHESIA:   general  EBL:     BLOOD ADMINISTERED:none  DRAINS: (1) Hemovact drain(s) in the l knee with  Suction Open   LOCAL MEDICATIONS USED:  OTHER experel and marcaine   SPECIMEN:  No Specimen  DISPOSITION OF SPECIMEN:  N/A  COUNTS:  YES  TOURNIQUET:   Total Tourniquet Time Documented: Thigh (Left) - 100 minutes Total: Thigh (Left) - 100 minutes   DICTATION: .Other Dictation: Dictation Number [id number]  PLAN OF CARE: Admit to inpatient   PATIENT DISPOSITION:  PACU - hemodynamically stable.   Delay start of Pharmacological VTE agent (>24hrs) due to surgical blood loss or risk of bleeding: no

## 2014-10-26 NOTE — Transfer of Care (Signed)
Immediate Anesthesia Transfer of Care Note  Patient: Janet GrumblesSusan A Mccutcheon  Procedure(s) Performed: Procedure(s): LEFT TOTAL KNEE ARTHROPLASTY (Left)  Patient Location: PACU  Anesthesia Type:Spinal  Level of Consciousness: awake and alert   Airway & Oxygen Therapy: Patient Spontanous Breathing and Patient connected to nasal cannula oxygen  Post-op Assessment: Report given to PACU RN and Post -op Vital signs reviewed and stable  Post vital signs: Reviewed and stable  Complications: No apparent anesthesia complications

## 2014-10-26 NOTE — Anesthesia Procedure Notes (Signed)
Spinal Patient location during procedure: OR Staffing Anesthesiologist: Suzette Battiest E Performed by: anesthesiologist  Preanesthetic Checklist Completed: patient identified, site marked, surgical consent, pre-op evaluation, timeout performed, IV checked, risks and benefits discussed and monitors and equipment checked Spinal Block Patient position: sitting Prep: Betadine Patient monitoring: heart rate, continuous pulse ox, blood pressure and cardiac monitor Location: L4-5 Injection technique: single-shot Needle Needle type: Spinocan  Needle gauge: 22 G Needle length: 9 cm Additional Notes Expiration date of kit checked and confirmed. Patient tolerated procedure well, without complications.

## 2014-10-27 ENCOUNTER — Encounter (HOSPITAL_COMMUNITY): Payer: Self-pay | Admitting: Orthopedic Surgery

## 2014-10-27 DIAGNOSIS — D62 Acute posthemorrhagic anemia: Secondary | ICD-10-CM

## 2014-10-27 DIAGNOSIS — E876 Hypokalemia: Secondary | ICD-10-CM

## 2014-10-27 HISTORY — DX: Acute posthemorrhagic anemia: D62

## 2014-10-27 LAB — BASIC METABOLIC PANEL
Anion gap: 17 — ABNORMAL HIGH (ref 5–15)
BUN: 11 mg/dL (ref 6–23)
CHLORIDE: 101 meq/L (ref 96–112)
CO2: 23 mEq/L (ref 19–32)
CREATININE: 0.9 mg/dL (ref 0.50–1.10)
Calcium: 8 mg/dL — ABNORMAL LOW (ref 8.4–10.5)
GFR calc non Af Amer: 60 mL/min — ABNORMAL LOW (ref >90)
GFR, EST AFRICAN AMERICAN: 70 mL/min — AB (ref >90)
Glucose, Bld: 106 mg/dL — ABNORMAL HIGH (ref 70–99)
Potassium: 3.3 mEq/L — ABNORMAL LOW (ref 3.7–5.3)
Sodium: 141 mEq/L (ref 137–147)

## 2014-10-27 LAB — CBC
HEMATOCRIT: 28 % — AB (ref 36.0–46.0)
Hemoglobin: 9.2 g/dL — ABNORMAL LOW (ref 12.0–15.0)
MCH: 26.3 pg (ref 26.0–34.0)
MCHC: 32.9 g/dL (ref 30.0–36.0)
MCV: 80 fL (ref 78.0–100.0)
Platelets: 169 10*3/uL (ref 150–400)
RBC: 3.5 MIL/uL — ABNORMAL LOW (ref 3.87–5.11)
RDW: 19.1 % — AB (ref 11.5–15.5)
WBC: 7.3 10*3/uL (ref 4.0–10.5)

## 2014-10-27 MED ORDER — ATORVASTATIN CALCIUM 10 MG PO TABS
10.0000 mg | ORAL_TABLET | Freq: Every day | ORAL | Status: DC
  Filled 2014-10-27 (×3): qty 1

## 2014-10-27 MED ORDER — POTASSIUM CHLORIDE CRYS ER 20 MEQ PO TBCR
20.0000 meq | EXTENDED_RELEASE_TABLET | Freq: Every day | ORAL | Status: DC
  Administered 2014-10-27 – 2014-10-28 (×2): 20 meq via ORAL
  Filled 2014-10-27 (×2): qty 1

## 2014-10-27 MED ORDER — BOOST / RESOURCE BREEZE PO LIQD
1.0000 | Freq: Two times a day (BID) | ORAL | Status: DC
  Administered 2014-10-28: 1 via ORAL

## 2014-10-27 NOTE — Anesthesia Postprocedure Evaluation (Signed)
  Anesthesia Post-op Note  Patient: Janet GrumblesSusan A Wargo  Procedure(s) Performed: Procedure(s): LEFT TOTAL KNEE ARTHROPLASTY (Left)  Patient Location: PACU  Anesthesia Type:Spinal  Level of Consciousness: awake, alert  and oriented  Airway and Oxygen Therapy: Patient Spontanous Breathing  Post-op Pain: none  Post-op Assessment: Post-op Vital signs reviewed  Post-op Vital Signs: Reviewed  Last Vitals:  Filed Vitals:   10/27/14 0608  BP: 135/83  Pulse: 98  Temp: 37.4 C  Resp:     Complications: No apparent anesthesia complications

## 2014-10-27 NOTE — Progress Notes (Signed)
INITIAL NUTRITION ASSESSMENT  DOCUMENTATION CODES Per approved criteria  -Not Applicable   INTERVENTION: Provide Resource Breeze po BID, each supplement provides 250 kcal and 9 grams of protein.  Provide nourishment snacks (crackers and fruit cups). Ordered.  Encourage PO intake.  NUTRITION DIAGNOSIS: Inadequate oral intake related to decreased appetite as evidenced by meal completion of 25%.   Goal: Pt to meet >/= 90% of their estimated nutrition needs   Monitor:  PO intake, weight trends, labs, I/O's  Reason for Assessment: MST  80 y.o. female  Admitting Dx: Primary osteoarthritis of left knee  ASSESSMENT: Pt has a history of pain and functional disability in the left knee due to arthritis and has failed non-surgical conservative treatments for greater than 12 weeks. Patient has evidence of subchondral cysts, subchondral sclerosis and joint space narrowing by imaging studies.  Meal completion is 25%. Pt reports having a decreased appetite, which has been ongoing over the past 2 months. Pt reports she has only been able to eat 1 full meal a day and some snacks at night. Prior to her decreased appetite, pt reports eating very well with no difficulties. Pt reports having lost weight with her usual body weight of 145 lbs. Noted pt with a 6% weight loss in the last 5 months, however is not found significant. Pt is agreeable on oral supplements, but reports she does not like the "milky" drinks. Will order [employer] Breeze. Pt also reports wanting snacks in between meals. Will order. Pt was encouraged to eat her food at meals.   Unable to perform nutrition focused physical exam at this time. Will perform during next visit.   PROCEDURE (11/12): LEFT TOTAL KNEE ARTHROPLASTY (Left)  Labs: Low potassium, calcium, and GFR.  Height: Ht Readings from Last 1 Encounters:  10/18/14 4\' 11"  (1.499 m)    Weight: Wt Readings from Last 1 Encounters:  10/26/14 136 lb (61.689 kg)    Ideal  Body Weight: 98 lbs  % Ideal Body Weight: 139%  Wt Readings from Last 10 Encounters:  10/26/14 136 lb (61.689 kg)  10/18/14 136 lb 3.2 oz (61.78 kg)  06/03/14 145 lb (65.772 kg)  05/25/14 145 lb 1.6 oz (65.817 kg)   Usual Body Weight: 145 lbs  % Usual Body Weight: 94%  BMI:  Body mass index is 27.45 kg/(m^2).  Estimated Nutritional Needs: Kcal: 1700-1900 Protein: 75-85 grams Fluid: 1.7 - 1.9 L/day  Skin: incision left knee, non-pitting LLE edema  Diet Order: Diet Heart  EDUCATION NEEDS: -No education needs identified at this time   Intake/Output Summary (Last 24 hours) at 10/27/14 0949 Last data filed at 10/27/14 0400  Gross per 24 hour  Intake   2770 ml  Output    575 ml  Net   2195 ml    Last BM: 11/9  Labs:   Recent Labs Lab 10/26/14 1119 10/27/14 0500  NA 142 141  K 3.7 3.3*  CL  --  101  CO2  --  23  BUN  --  11  CREATININE  --  0.90  CALCIUM  --  8.0*  GLUCOSE 88 106*    CBG (last 3)  No results for input(s): GLUCAP in the last 72 hours.  Scheduled Meds: . aspirin EC  325 mg Oral BID PC  . dicyclomine  10 mg Oral QAC lunch  . diltiazem  240 mg Oral Daily  . docusate sodium  100 mg Oral BID  . hydrochlorothiazide  12.5 mg Oral Daily  .  lisinopril  20 mg Oral Daily  . metoprolol tartrate  25 mg Oral Daily  . pantoprazole  40 mg Oral Daily  . polyvinyl alcohol  1 drop Both Eyes Daily  . simvastatin  20 mg Oral Daily    Continuous Infusions: . sodium chloride 100 mL/hr at 10/27/14 [id number]    Past Medical History  Diagnosis Date  . Family history of anesthesia complication     Daughter; takes a long time to wake up  . Hypertension   . Shortness of breath     while walking  . Frequency of urination     at time  . GERD (gastroesophageal reflux disease)   . Arthritis   . Hyperlipemia     Past Surgical History  Procedure Laterality Date  . Mitral valve repair    . Abdominal hysterectomy    . Eye surgery Bilateral     cataract  removed; lens implant  . Total knee arthroplasty Right 06/03/2014    Procedure: RIGHT TOTAL KNEE ARTHROPLASTY ;  Surgeon: Harvie JuniorJohn L Graves, MD;  Location: [facility];  Service: Orthopedics;  Laterality: Right;    Marijean NiemannStephanie La, MS, RD, LDN Pager # [phone] After hours/ weekend pager # [phone]

## 2014-10-27 NOTE — Care Management Note (Signed)
CARE MANAGEMENT NOTE 10/27/2014  Patient:  [name],[name]   Account Number:  [id number]  Date Initiated:  10/27/2014  Documentation initiated by:  Vance PeperBRADY,Athalene  Subjective/Objective Assessment:   80 yr old female admitted left knee DJD. Patient had [name] left total knee arthroplasty.     Action/Plan:   Case manager spoke with patient concerning home health and DME needs. choice offered. Referral called to [facility]onj[facility], [facility]. Patient going home with daughter to recover. Has RW and 3in1.   Anticipated DC Date:  10/28/2014   Anticipated DC Plan:  HOME W HOME HEALTH SERVICES      DC Planning Services  CM consult      [facility]  [facility]  DURABLE MEDICAL EQUIPMENT   Choice offered to / List presented to:  C-1 Patient   DME arranged  CPM      DME agency  [employer]     HH arranged  HH-2 PT      [facility]   Status of service:  Completed, signed off Medicare Important Message given?  NA - LOS <3 / Initial given by admissions (If response is "NO", the following Medicare IM given date fields will be blank) Date Medicare IM given:   Medicare IM given by:   Date Additional Medicare IM given:   Additional Medicare IM given by:    Discharge Disposition:  HOME W HOME HEALTH SERVICES  Per UR Regulation:  Reviewed for med. necessity/level of care/duration of stay

## 2014-10-27 NOTE — Progress Notes (Signed)
Utilization review completed.  

## 2014-10-27 NOTE — Op Note (Signed)
NAMBeverely Low:  Shirk, Halen                  ACCOUNT NO.:  [id number]  MEDICAL RECORD NO.:  [id number]  LOCATION:  [id number]                        FACILITY:  [facility]  PHYSICIAN:  Harvie JuniorJohn L. Nichlos Kunzler, M.D.   DATE OF BIRTH:  09/16/1934  DATE OF PROCEDURE:  10/26/2014 DATE OF DISCHARGE:                              OPERATIVE REPORT   PREOPERATIVE DIAGNOSIS:  End-stage degenerative joint disease, left knee.  POSTOPERATIVE DIAGNOSES:  End-stage degenerative joint disease, left knee with severe osteoporosis and softening of the bone.  PROCEDURE:  Left total knee replacement with a Sigma system with an M.B.T. Revision tray with two 10 mm augments, size 3 femur, 15 mm bridging bearing, and a 38 mm all-polyethylene patella.  SURGEON:  Harvie JuniorJohn L. Bellamia Ferch, M.D.  ASSISTANT:  Marshia LyJames Bethune, PA  ANESTHESIA:  General.  BRIEF HISTORY:  Ms. [name] is a 80 year old female with a long history of having severe complaints of pain in her left knee.  She had severe pain with ambulation and obvious angular deformity.  We evaluated her in the office and felt she was a candidate for left total knee replacement and after failure of all conservative care and because of night pain, light activity pain, she was taken to the operating room for left total knee replacement.  She has had a right total knee replacement done about 6 months ago and had done well with that, and she came to the operating room for this procedure.  PROCEDURE:  The patient was brought to the operating room.  After adequate anesthesia was obtained with general anesthetic, the patient placed supine on the operating table.  The left leg was then prepped and draped in usual sterile fashion.  Following this, the leg was exsanguinated.  Blood pressure tourniquet to the 350 mmHg.  Following this, a midline incision was made in the subcutaneous tissue down the level of the extensor mechanism and a medial parapatellar arthrotomy was undertaken.  Once this was  completed, attention was turned to the tibia, which was cut perpendicular to the long axis.  She had a severe deep dish deformity on the posterolateral side, I thought we are going to be able to get down to it with just additional resection, but still left us above where we wanted it to be at that point trying an augment, was probably out, so we did drop the tibia another 4 mm, which put us into a really deep deficit in terms of tibial cutting, but we needed that for the tibial preparation.  At that time, attention was turned to the femur.  It was drilled, a 5-degree valgus long alignment was made, and the cuts were made distally.  She was then sized to a 3.  Anterior, posterior cuts were made, chamfers and box.  Then we turned back to the tibia, spacer block was able to be put in place.  At this point, it was still little loose on the lateral side, and I felt that we needed to loosen up the medial side, take some osteophytes, loosen up the medial collateral ligament and when we did that, it really spaced us up to an M.B.T. graft tray with a 10 mm  build ups and a 15 poly.  We had trialed it with a standard tray with a 22.5 poly, but I felt like we should get the build-ups to give us some leeway and that is what we chose to do. So, at that point, the trial components were removed, the final components were opened, a size 2.5 M.B.T. revision tray with two 10 mm augments to a size 3 femur, a 15 mm bridging bearing was placed and a 38 mm all polyethylene patella was chosen and this gave us excellent fit and fill in the knee.  At this point, the final components were being cemented in as we outlined above and all excess bone cement was removed. The bone cement was allowed to completely harden.  20 mL of Exparel with 20 mL of Marcaine were instilled all around the capsule and into the subcutaneous fat for postoperative anesthesia and at this time the tourniquet was let down and 2 g of tranexamic  acid in 50 mL of saline on the sponge were placed into the knee and left for a minute to allow to cut down on postoperative bleeding.  The knee was then irrigated, suctioned dry.  Final poly was opened and placed.  Excellent range of motion and stability were achieved at this point, and the patient had a medium Hemovac drain placed.  The medial parapatellar arthrotomy was closed with 1 Vicryl running, the skin with 0 and 2-0 Vicryl and skin staples.  Sterile compressive dressing was applied, and the patient was taken to the recovery room and was noted to be in a satisfactory condition.  Estimated blood loss for the procedure was minimal.     Harvie JuniorJohn L. Ariana Cavenaugh, M.D.     Ranae PlumberJLG/MEDQ  D:  10/26/2014  T:  10/27/2014  Job:  [id number]

## 2014-10-27 NOTE — Evaluation (Signed)
Physical Therapy Evaluation Patient Details Name: [name] MRN: [id number] DOB: 1934-12-09 Today's Date: 10/27/2014   History of Present Illness  80 [facility]. [facility] for [facility] L TKA.  Pt with significant PMHx of R TKA, HTN, and mitral valve repair.  Clinical Impression  Pt is POD #1 and is moving slowly.  She was only able to make it 3310' with RW and mod assist during her AM session. She is hoping to progress well enough to d/c home with her daughter's assist and HHPT like she did with her other knee at discharge.   PT to follow acutely for deficits listed below.       Follow Up Recommendations Home health PT    Equipment Recommendations  None recommended by PT    Recommendations for Other Services   NA    Precautions / Restrictions Precautions Precautions: Knee;Fall Precaution Comments: reviewed WB status, KI use Required Braces or Orthoses: Knee Immobilizer - Left Knee Immobilizer - Left: On when out of bed or walking Restrictions LLE Weight Bearing: Weight bearing as tolerated      Mobility  Bed Mobility Overal bed mobility: Needs Assistance Bed Mobility: Sit to Supine       Sit to supine: Mod assist   General bed mobility comments: Mod assist of both legs to get back into bed from sitting.   Transfers Overall transfer level: Needs assistance Equipment used: Rolling walker (2 wheeled) Transfers: Sit to/from Stand Sit to Stand: Min assist         General transfer comment: Min assist to support trunk during transition to stand and to sit to control descent and support truk over weak and painful left leg.   Ambulation/Gait Ambulation/Gait assistance: Mod assist Ambulation Distance (Feet): 10 Feet Assistive device: Rolling walker (2 wheeled) Gait Pattern/deviations: Step-to pattern;Antalgic;Trunk flexed Gait velocity: decreased Gait velocity interpretation: Below normal speed for age/gender General Gait Details: Pt with heavily  antalgic gait pattern, leaning over the walker, stopping to rest frequently by leaning arms on walker handles.  Verbal cues for upright posture and to use arms to unweight painful left leg.           Balance Overall balance assessment: Needs assistance Sitting-balance support: Feet supported;No upper extremity supported Sitting balance-Leahy Scale: Good     Standing balance support: Bilateral upper extremity supported Standing balance-Leahy Scale: Poor Standing balance comment: Pt needs external support to get to and to maintain standing.                              Pertinent Vitals/Pain Pain Assessment: 0-10 Pain Score: 8  Pain Location: knee left Pain Descriptors / Indicators: Aching;Burning Pain Intervention(s): Limited activity within patient's tolerance;Monitored during session;Repositioned    Home Living Family/patient expects to be discharged to:: Private residence Living Arrangements: Children Available Help at Discharge: Family;Available 24 hours/day Type of Home: House Home Access: Stairs to enter Entrance Stairs-Rails: Right Entrance Stairs-Number of Steps: 4 Home Layout: One level Home Equipment: Walker - 2 wheels;Cane - single point;Bedside commode;Shower seat;Wheelchair - manual               Extremity/Trunk Assessment   Upper Extremity Assessment: Overall WFL for tasks assessed           Lower Extremity Assessment: LLE deficits/detail   LLE Deficits / Details: left leg with normal post op pain and weakness, ankle 3/5, knee 2/5, hip 2+/5  Cervical /  Trunk Assessment: Normal  Communication   Communication: No difficulties  Cognition Arousal/Alertness: Lethargic;Suspect due to medications Behavior During Therapy: [facility] for tasks assessed/performed Overall Cognitive Status: Within Functional Limits for tasks assessed                               Assessment/Plan    PT Assessment Patient needs continued PT services   PT Diagnosis Difficulty walking;Abnormality of gait;Generalized weakness;Acute pain   PT Problem List Decreased strength;Decreased range of motion;Decreased balance;Decreased activity tolerance;Decreased mobility;Decreased knowledge of use of DME;Decreased knowledge of precautions;Pain  PT Treatment Interventions DME instruction;Gait training;Stair training;Functional mobility training;Therapeutic activities;Therapeutic exercise;Balance training;Patient/family education;Manual techniques;Modalities   PT Goals (Current goals can be found in the Care Plan section) Acute Rehab PT Goals Patient Stated Goal: to go home at discharge PT Goal Formulation: With patient/family Time For Goal Achievement: 11/03/14 Potential to Achieve Goals: Good    Frequency 7X/week           End of Session Equipment Utilized During Treatment: Gait belt;Left knee immobilizer Activity Tolerance: Patient tolerated treatment well;Patient limited by fatigue;Patient limited by pain Patient left: in bed;with call bell/phone within reach;with family/visitor present           Time: 4098-11911014-1036 PT Time Calculation (min) (ACUTE ONLY): 22 min   Charges:   PT Evaluation $Initial PT Evaluation Tier I: 1 Procedure PT Treatments $Gait Training: 8-22 mins        Allin Frix B. Eliyas Suddreth, PT, DPT [phone]   10/27/2014, 3:21 PM

## 2014-10-27 NOTE — Plan of Care (Signed)
Problem: Phase I Progression Outcomes Goal: CMS/Neurovascular status WDL Outcome: Completed/Met Date Met:  10/27/14 Goal: Initial discharge plan identified Outcome: Progressing  Problem: Phase II Progression Outcomes Goal: Tolerating diet Outcome: Progressing Goal: Discharge plan established Outcome: Progressing

## 2014-10-27 NOTE — Progress Notes (Signed)
Subjective: 1 Day Post-Op Procedure(s) (LRB): LEFT TOTAL KNEE ARTHROPLASTY (Left) Patient reports pain as 1 on 0-10 scale.  Taking by mouth and voiding okay.  Objective: Vital signs in last 24 hours: Temp:  [95.4 F (35.2 C)-99.3 F (37.4 C)] 99.3 F (37.4 C) (11/12 0608) Pulse Rate:  [49-103] 98 (11/12 0608) Resp:  [18-21] 20 (11/11 1800) BP: (135-170)/(75-98) 135/83 mmHg (11/12 0608) SpO2:  [99 %-100 %] 99 % (11/12 0608)  Intake/Output from previous day: 11/11 0701 - 11/12 0700 In: 2770 [P.O.:120; I.V.:2550; IV Piggyback:100] Out: 575 [Drains:525; Blood:50] Intake/Output this shift: Total I/O In: 120 [P.O.:120] Out: -    Recent Labs  10/26/14 1119 10/27/14 0500  HGB 13.3 9.2*    Recent Labs  10/26/14 1119 10/27/14 0500  WBC  --  7.3  RBC  --  3.50*  HCT 39.0 28.0*  PLT  --  169    Recent Labs  10/26/14 1119 10/27/14 0500  NA 142 141  K 3.7 3.3*  CL  --  101  CO2  --  23  BUN  --  11  CREATININE  --  0.90  GLUCOSE 88 106*  CALCIUM  --  8.0*   No results for input(s): LABPT, INR in the last 72 hours. Left knee exam: Neurovascular intact Sensation intact distally Intact pulses distally Dorsiflexion/Plantar flexion intact Incision: dressing C/D/I Compartment soft  Assessment/Plan: 1 Day Post-Op Procedure(s) (LRB): LEFT TOTAL KNEE ARTHROPLASTY (Left). Expected blood loss anemia postop    Asymptomatic. Mild hypokalemia. Plan: Continue aspirin 325 mg twice daily for DVT prophylaxis with SCD hose. hemovac drain pulled. Check hemoglobin in the a.m. Check be met in the a.m.   Start on low dose potassium orally. Up with therapy Probable discharge home with home health physical therapy tomorrow. Davell Beckstead G 10/27/2014, 11:31 AM

## 2014-10-27 NOTE — Progress Notes (Signed)
Physical Therapy Treatment Patient Details Name: Janet GrumblesSusan A Mullane MRN: [id number] DOB: 1934/04/17 Today's Date: 10/27/2014    History of Present Illness 80 [facility]. [facility] for [facility] L TKA.  Pt with significant PMHx of R TKA, HTN, and mitral valve repair.    PT Comments    Pt's second session and she continues to be very lethargic, difficult to keep attending to task and limited significantly by pain and fatigue during gait.  She was not able to progress gait distance or exercises this PM.  Daughter present and reports it takes her a long time to get over the effects of anesthesia.  Daughter is determined to take her home and care for her.   PT will continue to follow acutely and updated d/c recs as needed.    Follow Up Recommendations  Home health PT     Equipment Recommendations  None recommended by PT    Recommendations for Other Services   NA     Precautions / Restrictions Precautions Precautions: Knee;Fall Restrictions LLE Weight Bearing: Weight bearing as tolerated    Mobility  Bed Mobility Overal bed mobility: Needs Assistance Bed Mobility: Supine to Sit;Sit to Supine     Supine to sit: Min assist Sit to supine: Mod assist   General bed mobility comments: Min assist to help pt progress her left leg over EOB and Mod assist to help lift both legs back into the bed from sitting to supine.  Verbal cues for hand placement and sequencing.   Transfers Overall transfer level: Needs assistance Equipment used: Rolling walker (2 wheeled) Transfers: Sit to/from Stand Sit to Stand: Min assist         General transfer comment: Min assist to support trunk during transition to stand.  Verbal cues for safe hand placement.   Ambulation/Gait Ambulation/Gait assistance: Mod assist;Max assist Ambulation Distance (Feet): 10 Feet Assistive device: Rolling walker (2 wheeled) Gait Pattern/deviations: Step-to pattern;Antalgic;Trunk flexed Gait velocity:  decreased Gait velocity interpretation: Below normal speed for age/gender General Gait Details: Pt continues to have a significantly antalgic gait pattern. She is lethargic and slow to move and for this reason fatigued second half of the walk requiring max assist to continue to walk to get back to the bed with RW.  Daughter present and encouraging her and helping to keep her attention to task at hand.    Stairs            Wheelchair Mobility    Modified Rankin (Stroke Patients Only)       Balance Overall balance assessment: Needs assistance Sitting-balance support: Feet supported;Bilateral upper extremity supported Sitting balance-Leahy Scale: Poor Sitting balance - Comments: Pt unable to sit unsupported EOB.    Standing balance support: Bilateral upper extremity supported Standing balance-Leahy Scale: Poor Standing balance comment: Pt needs mod to max assist to maintain standing as well as external assistive device                    Cognition Arousal/Alertness: Lethargic;Suspect due to medications Behavior During Therapy: Flat affect Overall Cognitive Status: Within Functional Limits for tasks assessed                      Exercises      General Comments General comments (skin integrity, edema, etc.):  (too lethargic to keep attention for exercises, put in CPM)      Pertinent Vitals/Pain Pain Assessment: Faces Faces Pain Scale: Hurts whole lot Pain Location:  left knee with mobility and movement Pain Descriptors / Indicators: Aching;Burning Pain Intervention(s): Limited activity within patient's tolerance;Monitored during session;Repositioned    Home Living                      Prior Function            PT Goals (current goals can now be found in the care plan section) Acute Rehab PT Goals Patient Stated Goal: to go home at discharge Progress towards PT goals: Not progressing toward goals - comment (limited by lethargy)     Frequency  7X/week    PT Plan Current plan remains appropriate (will re assess tomorrow)    Co-evaluation             End of Session Equipment Utilized During Treatment: Gait belt;Left knee immobilizer Activity Tolerance: Patient limited by lethargy;Patient limited by pain Patient left: in bed;with call bell/phone within reach;with family/visitor present     Time: [id number] PT Time Calculation (min) (ACUTE ONLY): 23 min  Charges:  $Gait Training: 8-22 mins $Therapeutic Activity: 8-22 mins                    G Codes:      Loxley Cibrian B. Genice Kimberlin, PT, DPT [phone]   10/27/2014, 10:42 PM

## 2014-10-28 LAB — BASIC METABOLIC PANEL
Anion gap: 18 — ABNORMAL HIGH (ref 5–15)
BUN: 13 mg/dL (ref 6–23)
CALCIUM: 8.3 mg/dL — AB (ref 8.4–10.5)
CO2: 22 mEq/L (ref 19–32)
Chloride: 97 mEq/L (ref 96–112)
Creatinine, Ser: 0.85 mg/dL (ref 0.50–1.10)
GFR calc Af Amer: 75 mL/min — ABNORMAL LOW (ref >90)
GFR, EST NON AFRICAN AMERICAN: 64 mL/min — AB (ref >90)
GLUCOSE: 112 mg/dL — AB (ref 70–99)
Potassium: 3.2 mEq/L — ABNORMAL LOW (ref 3.7–5.3)
SODIUM: 137 meq/L (ref 137–147)

## 2014-10-28 LAB — CBC
HEMATOCRIT: 26 % — AB (ref 36.0–46.0)
Hemoglobin: 8.6 g/dL — ABNORMAL LOW (ref 12.0–15.0)
MCH: 26.4 pg (ref 26.0–34.0)
MCHC: 33.1 g/dL (ref 30.0–36.0)
MCV: 79.8 fL (ref 78.0–100.0)
Platelets: 146 10*3/uL — ABNORMAL LOW (ref 150–400)
RBC: 3.26 MIL/uL — ABNORMAL LOW (ref 3.87–5.11)
RDW: 19 % — ABNORMAL HIGH (ref 11.5–15.5)
WBC: 11.4 10*3/uL — ABNORMAL HIGH (ref 4.0–10.5)

## 2014-10-28 MED ORDER — HYDROCODONE-ACETAMINOPHEN 5-325 MG PO TABS: 1.0000 | ORAL_TABLET | Freq: Four times a day (QID) | ORAL | Status: DC | PRN

## 2014-10-28 NOTE — Plan of Care (Signed)
Problem: Phase II Progression Outcomes Goal: Tolerating diet Outcome: Completed/Met Date Met:  10/28/14  Problem: Phase III Progression Outcomes Goal: Pain controlled on oral analgesia Outcome: Completed/Met Date Met:  10/28/14

## 2014-10-28 NOTE — Plan of Care (Signed)
Problem: Phase II Progression Outcomes Goal: Discharge plan established Outcome: Completed/Met Date Met:  10/28/14

## 2014-10-28 NOTE — Progress Notes (Signed)
Physical Therapy Treatment Patient Details Name: [name] MRN: [id number] DOB: 1934-09-12 Today's Date: 10/28/2014    History of Present Illness 80 [facility]. [facility] L TKA.  Pt with significant PMHx of R TKA, HTN, and mitral valve repair.    PT Comments    I continue to recommend SNF for rehab for this patient despite her progress in her second session today.  Daughter is pleasant but adamant that they do not want to consider SNF for rehab and that they feel equipped to care for her mom in the home based on prior experience of caring for the grandmother.  I educated [name] to have physical assist at all times with mobility until she further improves due to her posterior tendency and risk for falls.  She plans to use a safety belt to assist her mom.    Follow Up Recommendations  SNF;Supervision/Assistance - 24 hour;Supervision for mobility/OOB  Since family declines SNF, I recommend HHPT and at least min assist with all mobility.     Equipment Recommendations  None recommended by PT    Recommendations for Other Services       Precautions / Restrictions Precautions Precautions: Knee;Fall Precaution Comments: reinforced good positioning, no use of pillow under knee and ther exs Required Braces or Orthoses: Knee Immobilizer - Left Knee Immobilizer - Left: On when out of bed or walking Restrictions Weight Bearing Restrictions: Yes LLE Weight Bearing: Weight bearing as tolerated    Mobility  Bed Mobility Overal bed mobility:  (not tested, pt. in recliner)                Transfers Overall transfer level: Needs assistance Equipment used: Rolling walker (2 wheeled) Transfers: Sit to/from Stand Sit to Stand: Min assist;Min guard;Supervision         General transfer comment: multiple trials of sit to stand for improving balance and translation of center of mass over base of support.  Pt. initially needing min assist  and  with repetition, she could do with supervision and verbal encouragement to translate forward. Needed frequent vc's for correct hand placement for sit<->`  Ambulation/Gait Ambulation/Gait assistance: Min assist Ambulation Distance (Feet): 16 Feet (8' x 2 with seated rest) Assistive device: Rolling walker (2 wheeled) Gait Pattern/deviations: Step-to pattern;Decreased step length - right;Decreased step length - left;Decreased stance time - left;Antalgic Gait velocity: decreased   General Gait Details: Pt. continues to [name] step by step verbal cues for sequence and technique/step length.  Needed min assist for balance and stability.  discussed with [name] the benefits of use of safety belt.  [name] indicated she has one available to her and that she will use it to assist her mom.     Stairs Stairs:  (pt. no yet ready to practice steps)          Wheelchair Mobility    Modified Rankin (Stroke Patients Only)       Balance                                    Cognition Arousal/Alertness: Awake/alert Behavior During Therapy: WFL for tasks assessed/performed Overall Cognitive Status: Within Functional Limits for tasks assessed                      Exercises Total Joint Exercises Ankle Circles/Pumps: AROM;Both;10 reps;Seated Quad Sets: AROM;Both;10 reps;Supine Short Arc Quad:  AAROM;Left;10 reps;Seated    General Comments        Pertinent Vitals/Pain Pain Assessment: Faces Faces Pain Scale: Hurts little more Pain Location: left knee Pain Descriptors / Indicators: Aching Pain Intervention(s): Monitored during session;Limited activity within patient's tolerance;Repositioned;Ice applied    Home Living                      Prior Function            PT Goals (current goals can now be found in the care plan section) Progress towards PT goals: Progressing toward goals    Frequency  7X/week    PT Plan Current plan remains appropriate     Co-evaluation             End of Session Equipment Utilized During Treatment: Gait belt;Left knee immobilizer Activity Tolerance: Patient limited by pain;Patient limited by fatigue Patient left: in chair;with bed alarm set;with family/visitor present     Time: 1120-1145 PT Time Calculation (min) (ACUTE ONLY): 25 min  Charges:  $Gait Training: 8-22 mins $Therapeutic Exercise: 8-22 mins                    G Codes:      [name], [name] 10/28/2014, 3:57 PM Weldon PickingSusan [name] Para PT Acute Rehab Services [phone] [place] [phone]

## 2014-10-28 NOTE — Progress Notes (Addendum)
Physical Therapy Treatment Patient Details Name: Janet GrumblesSusan A Bullen MRN: [id number] DOB: 12-04-1934 Today's Date: 10/28/2014    History of Present Illness 80 y.o. female [facility] for elective L TKA.  Pt with significant PMHx of R TKA, HTN, and mitral valve repair.    PT Comments    Pt. 's daughter called pt. At onset of my session with pt.  Daughter inquiring if pt. Was alert this am.  I informed her that pt. Was awake and interactive.  Pt. With slow progress and is still needing at least mod assist during my am session with her.  I believe daughter would have a difficult time managing her care and therefore have changed my DC recommendation to SNF for ongoing therapies and longer recovery time.  I notified Vance PeperSusan Brady of the change in PT recommendation.  I will reach out to pt's daughter.    Addendum:  I have left a voice mail for pt's daughter to call me to discuss PT DC recommendations.  Will await her return call.  Follow Up Recommendations  SNF;Supervision/Assistance - 24 hour;Supervision for mobility/OOB     Equipment Recommendations  None recommended by PT    Recommendations for Other Services       Precautions / Restrictions Precautions Precautions: Knee;Fall Precaution Comments: reviewed knee positioning and initial TKA  ther ex  Required Braces or Orthoses: Knee Immobilizer - Left Knee Immobilizer - Left: On when out of bed or walking Restrictions Weight Bearing Restrictions: Yes LLE Weight Bearing: Weight bearing as tolerated    Mobility  Bed Mobility Overal bed mobility:  (not tested, pt. presents in recliner)                Transfers Overall transfer level: Needs assistance Equipment used: Rolling walker (2 wheeled) Transfers: Sit to/from Stand Sit to Stand: Mod assist         General transfer comment: three trials needed for pt. to rise to stand with help needed to power up  Ambulation/Gait Ambulation/Gait assistance: Mod  assist Ambulation Distance (Feet): 5 Feet Assistive device: Rolling walker (2 wheeled) Gait Pattern/deviations: Step-to pattern;Decreased step length - right;Decreased step length - left;Decreased stance time - left;Antalgic Gait velocity: decreased   General Gait Details: Pt. needed step by step verbal cues to be able to ambulate 5' ; mod assist needed for safety/stability and RW management.  Pt. with very short step length.  Fatigued quickly and needed to sit in Sales promotion account executiverecliner   Stairs            Wheelchair Mobility    Modified Rankin (Stroke Patients Only)       Balance                                    Cognition Arousal/Alertness: Awake/alert Behavior During Therapy: WFL for tasks assessed/performed Overall Cognitive Status: Within Functional Limits for tasks assessed                      Exercises Total Joint Exercises Ankle Circles/Pumps: AROM;Both;10 reps;Seated Quad Sets: AROM;Both;10 reps;Supine Short Arc Quad: AAROM;Left;10 reps;Seated Hip ABduction/ADduction: AAROM;Left;10 reps;Seated Knee Flexion: AROM;AAROM;Left;5 reps;Seated Goniometric ROM: -8 to 75    General Comments        Pertinent Vitals/Pain Pain Assessment: 0-10 Pain Score: 6  Pain Location: left knee Pain Descriptors / Indicators: Aching Pain Intervention(s): Limited activity within patient's tolerance;Monitored during session;Repositioned;Patient requesting  pain meds-RN notified    Home Living                      Prior Function            PT Goals (current goals can now be found in the care plan section) Progress towards PT goals: Progressing toward goals (slowly progressing)    Frequency  7X/week    PT Plan Discharge plan needs to be updated    Co-evaluation             End of Session Equipment Utilized During Treatment: Gait belt;Left knee immobilizer Activity Tolerance: Patient limited by pain;Patient limited by fatigue Patient left: in  chair;with call bell/phone within reach     Time: 0816-0839 PT Time Calculation (min) (ACUTE ONLY): 23 min  Charges:  $Gait Training: 8-22 mins $Therapeutic Exercise: 8-22 mins                    G Codes:      Ferman HammingBlankenship, Yarithza B 10/28/2014, 10:56 AM Weldon PickingSusan Hurshel Bouillon PT Acute Rehab Services [phone] Beeper [phone]

## 2014-10-28 NOTE — Discharge Summary (Signed)
Patient ID: [name] MRN: [id number] DOB/AGE: 1934/05/31 80 y.o.  Admit date: 10/26/2014 Discharge date: 10/28/2014  Admission Diagnoses:  Principal Problem:   Primary osteoarthritis of left knee Active Problems:   Status post total right knee replacement   Hypokalemia   Postoperative anemia due to acute blood loss   Discharge Diagnoses:  Same  Past Medical History  Diagnosis Date  . Family history of anesthesia complication     Daughter; takes a long time to wake up  . Hypertension   . Shortness of breath     while walking  . Frequency of urination     at time  . GERD (gastroesophageal reflux disease)   . Arthritis   . Hyperlipemia     Surgeries: Procedure(s): LEFT TOTAL KNEE ARTHROPLASTY on 10/26/2014   Consultants:    Discharged Condition: Improved  Hospital Course: [name] is an 80 y.o. female who was admitted 10/26/2014 for operative treatment ofPrimary osteoarthritis of left knee. Patient has severe unremitting pain that affects sleep, daily activities, and work/hobbies. After pre-op clearance the patient was taken to the operating room on 10/26/2014 and underwent  Procedure(s): LEFT TOTAL KNEE ARTHROPLASTY.    Patient was given perioperative antibiotics: Anti-infectives    Start     Dose/Rate Route Frequency Ordered Stop   10/26/14 1815  ceFAZolin (ANCEF) IVPB 2 g/50 mL premix     2 g100 mL/hr over 30 Minutes Intravenous Every 6 hours 10/26/14 1813 10/26/14 2358   10/26/14 0600  ceFAZolin (ANCEF) IVPB 2 g/50 mL premix     2 g100 mL/hr over 30 Minutes Intravenous On call to O.R. 10/25/14 1401 10/26/14 1322       Patient was given sequential compression devices, early ambulation, and chemoprophylaxis to prevent DVT.she had asymptomatic acute blood loss anemia.  Patient benefited maximally from hospital stay and there were no complications.    Recent vital signs: Patient Vitals for the past 24 hrs:  BP Temp Temp src Pulse Resp SpO2  10/28/14 1055  112/76 mmHg - - 74 - -  10/28/14 0540 131/70 mmHg 99.6 F (37.6 C) Oral 79 15 95 %  10/27/14 2111 (!) 132/56 mmHg (!) 101.4 F (38.6 C) Oral (!) 56 17 94 %  10/27/14 1241 (!) 144/89 mmHg 98.6 F (37 C) - (!) 107 18 95 %     Recent laboratory studies:  Recent Labs  10/27/14 0500 10/28/14 0409  WBC 7.3 11.4*  HGB 9.2* 8.6*  HCT 28.0* 26.0*  PLT 169 146*  NA 141 137  K 3.3* 3.2*  CL 101 97  CO2 23 22  BUN 11 13  CREATININE 0.90 0.85  GLUCOSE 106* 112*  CALCIUM 8.0* 8.3*     Discharge Medications:     Medication List    TAKE these medications        acetaminophen 650 MG CR tablet  Commonly known as:  TYLENOL  Take 650 mg by mouth daily as needed for pain.     AFRIN NASAL SPRAY NA  Place 2-3 sprays into both nostrils daily as needed (congestion).     aspirin EC 325 MG tablet  Take 1 tablet (325 mg total) by mouth 2 (two) times daily after a meal.     dicyclomine 10 MG capsule  Commonly known as:  BENTYL  Take 10 mg by mouth daily before lunch.     diltiazem 240 MG 24 hr capsule  Commonly known as:  DILACOR XR  Take 240 mg by  mouth daily.     HYDROcodone-acetaminophen 5-325 MG per tablet  Commonly known as:  NORCO  Take 1-2 tablets by mouth every 6 (six) hours as needed for moderate pain.     lisinopril-hydrochlorothiazide 20-12.5 MG per tablet  Commonly known as:  PRINZIDE,ZESTORETIC  Take 1 tablet by mouth daily.     methocarbamol 500 MG tablet  Commonly known as:  ROBAXIN  Take 1 tablet (500 mg total) by mouth every 8 (eight) hours as needed for muscle spasms.     metoprolol tartrate 25 MG tablet  Commonly known as:  LOPRESSOR  Take 25 mg by mouth daily.     omeprazole 20 MG capsule  Commonly known as:  PRILOSEC  Take 20 mg by mouth daily.     simvastatin 20 MG tablet  Commonly known as:  ZOCOR  Take 20 mg by mouth daily.     SYSTANE OP  Place 2 drops into both eyes daily.     VITAMIN C PO  Take 1 tablet by mouth daily.         Diagnostic Studies: No results found.  Disposition: 06-Home-Health Care Svc      Discharge Instructions    CPM    Complete by:  As directed   Continuous passive motion machine (CPM):      Use the CPM from 0 degrees  to 70 for 6 hours per day.      You may increase by 5-10 per day.  You may break it up into 2 or 3 sessions per day.      Use CPM for 1-2  weeks or until you are told to stop.     Call MD / Call 911    Complete by:  As directed   If you experience chest pain or shortness of breath, CALL 911 and be transported to the hospital emergency room.  If you develope a fever above 101 F, pus (white drainage) or increased drainage or redness at the wound, or calf pain, call your surgeon's office.     Constipation Prevention    Complete by:  As directed   Drink plenty of fluids.  Prune juice may be helpful.  You may use a stool softener, such as Colace (over the counter) 100 mg twice a day.  Use MiraLax (over the counter) for constipation as needed.     Diet general    Complete by:  As directed      Do not put a pillow under the knee. Place it under the heel.    Complete by:  As directed      Increase activity slowly as tolerated    Complete by:  As directed      TED hose    Complete by:  As directed   Use stockings (TED hose) for 2 weeks on both leg(s).  You may remove them at night for sleeping.     Weight bearing as tolerated    Complete by:  As directed   Laterality:  left  Extremity:  Lower     Weight bearing as tolerated    Complete by:  As directed   Laterality:  left  Extremity:  Lower           Follow-up Information    Follow up with GRAVES,JOHN L, MD. Schedule an appointment as soon as possible for a visit in 2 weeks.   Specialty:  Orthopedic Surgery   Contact information:   [address] [place] [address] [phone]  [facility].   Why:  [facility] will contact you concerning start date and  time for physical therapy.   Contact information:   [address] [place] [address] [phone]        Signed: Matthew FolksBETHUNE,Berdine Rasmusson G 10/28/2014, 12:20 PM

## 2014-10-28 NOTE — Progress Notes (Signed)
Subjective: 2 Days Post-Op Procedure(s) (LRB): LEFT TOTAL KNEE ARTHROPLASTY (Left) Patient reports pain as mild.  Moderately slow progress with PT, but patient's daughter states she will take care of her and she is ready to manage her at home. She is taking by mouth and voiding okay. Positive flatus.  Objective: Vital signs in last 24 hours: Temp:  [98.6 F (37 C)-101.4 F (38.6 C)] 99.6 F (37.6 C) (11/13 0540) Pulse Rate:  [56-107] 74 (11/13 1055) Resp:  [15-18] 15 (11/13 0540) BP: (112-144)/(56-89) 112/76 mmHg (11/13 1055) SpO2:  [94 %-95 %] 95 % (11/13 0540)  Intake/Output from previous day: 11/12 0701 - 11/13 0700 In: 600 [P.O.:600] Out: 700 [Urine:700] Intake/Output this shift: Total I/O In: 120 [P.O.:120] Out: -    Recent Labs  10/26/14 1119 10/27/14 0500 10/28/14 0409  HGB 13.3 9.2* 8.6*    Recent Labs  10/27/14 0500 10/28/14 0409  WBC 7.3 11.4*  RBC 3.50* 3.26*  HCT 28.0* 26.0*  PLT 169 146*    Recent Labs  10/27/14 0500 10/28/14 0409  NA 141 137  K 3.3* 3.2*  CL 101 97  CO2 23 22  BUN 11 13  CREATININE 0.90 0.85  GLUCOSE 106* 112*  CALCIUM 8.0* 8.3*   Left knee exam: Neurovascular intact Sensation intact distally Intact pulses distally Dorsiflexion/Plantar flexion intact Incision: dressing C/D/I Compartment soft  Assessment/Plan: 2 Days Post-Op Procedure(s) (LRB): LEFT TOTAL KNEE ARTHROPLASTY (Left)  Acute blood loss anemia, expected. Asymptomatic. Plan: Weight-bear as tolerated on left. Discharge home with home health  Follow-up with Dr. Luiz BlareGraves in 2 weeks. Enteric-coated aspirin 325 mg twice daily 1 month postop. Take with food. Amya Hlad G 10/28/2014, 12:14 PM

## 2014-10-28 NOTE — Clinical Social Work Note (Signed)
CS[facility] change in disposition from PT recommendation. RNCM stated pt's family is not agreeable to SNF placement and pt will be discharging home with family. CSW signing off.  Marcelline Deistmily Emidio Warrell, LCSWA [phone]) Licensed Clinical Social Worker Orthopedics [phone]([id number]) and Surgical [phone]([id number])

## 2014-10-31 ENCOUNTER — Emergency Department (HOSPITAL_COMMUNITY): Payer: Medicare Other

## 2014-10-31 ENCOUNTER — Inpatient Hospital Stay (HOSPITAL_COMMUNITY)
Admission: EM | Admit: 2014-10-31 | Discharge: 2014-11-02 | DRG: 066 | Disposition: A | Discharge status: Home Health | Payer: Medicare Other | Attending: Internal Medicine | Admitting: Internal Medicine

## 2014-10-31 ENCOUNTER — Inpatient Hospital Stay (HOSPITAL_COMMUNITY): Payer: Medicare Other

## 2014-10-31 ENCOUNTER — Encounter (HOSPITAL_COMMUNITY): Payer: Self-pay | Admitting: Emergency Medicine

## 2014-10-31 DIAGNOSIS — K5900 Constipation, unspecified: Secondary | ICD-10-CM | POA: Diagnosis present

## 2014-10-31 DIAGNOSIS — R7989 Other specified abnormal findings of blood chemistry: Secondary | ICD-10-CM | POA: Insufficient documentation

## 2014-10-31 DIAGNOSIS — Z96642 Presence of left artificial hip joint: Secondary | ICD-10-CM

## 2014-10-31 DIAGNOSIS — Z8711 Personal history of peptic ulcer disease: Secondary | ICD-10-CM

## 2014-10-31 DIAGNOSIS — I272 Other secondary pulmonary hypertension: Secondary | ICD-10-CM | POA: Diagnosis present

## 2014-10-31 DIAGNOSIS — Z96653 Presence of artificial knee joint, bilateral: Secondary | ICD-10-CM | POA: Diagnosis present

## 2014-10-31 DIAGNOSIS — I739 Peripheral vascular disease, unspecified: Secondary | ICD-10-CM | POA: Diagnosis present

## 2014-10-31 DIAGNOSIS — E876 Hypokalemia: Secondary | ICD-10-CM | POA: Diagnosis present

## 2014-10-31 DIAGNOSIS — D649 Anemia, unspecified: Secondary | ICD-10-CM | POA: Diagnosis present

## 2014-10-31 DIAGNOSIS — I639 Cerebral infarction, unspecified: Secondary | ICD-10-CM | POA: Diagnosis present

## 2014-10-31 DIAGNOSIS — R778 Other specified abnormalities of plasma proteins: Secondary | ICD-10-CM | POA: Insufficient documentation

## 2014-10-31 DIAGNOSIS — I10 Essential (primary) hypertension: Secondary | ICD-10-CM | POA: Diagnosis present

## 2014-10-31 DIAGNOSIS — M1990 Unspecified osteoarthritis, unspecified site: Secondary | ICD-10-CM | POA: Diagnosis present

## 2014-10-31 DIAGNOSIS — K219 Gastro-esophageal reflux disease without esophagitis: Secondary | ICD-10-CM | POA: Diagnosis present

## 2014-10-31 DIAGNOSIS — Z87891 Personal history of nicotine dependence: Secondary | ICD-10-CM

## 2014-10-31 DIAGNOSIS — I080 Rheumatic disorders of both mitral and aortic valves: Secondary | ICD-10-CM | POA: Diagnosis present

## 2014-10-31 DIAGNOSIS — R748 Abnormal levels of other serum enzymes: Secondary | ICD-10-CM

## 2014-10-31 DIAGNOSIS — E785 Hyperlipidemia, unspecified: Secondary | ICD-10-CM | POA: Diagnosis present

## 2014-10-31 HISTORY — DX: Cerebral infarction, unspecified: I63.9

## 2014-10-31 HISTORY — DX: Unspecified atrial fibrillation: I48.91

## 2014-10-31 LAB — I-STAT TROPONIN, ED: Troponin i, poc: 0.16 ng/mL (ref 0.00–0.08)

## 2014-10-31 LAB — I-STAT CHEM 8, ED
BUN: 14 mg/dL (ref 6–23)
Calcium, Ion: 1.11 mmol/L — ABNORMAL LOW (ref 1.13–1.30)
Chloride: 96 mEq/L (ref 96–112)
Creatinine, Ser: 0.8 mg/dL (ref 0.50–1.10)
Glucose, Bld: 105 mg/dL — ABNORMAL HIGH (ref 70–99)
HEMATOCRIT: 26 % — AB (ref 36.0–46.0)
Hemoglobin: 8.8 g/dL — ABNORMAL LOW (ref 12.0–15.0)
Potassium: 2.8 mEq/L — CL (ref 3.7–5.3)
SODIUM: 135 meq/L — AB (ref 137–147)
TCO2: 23 mmol/L (ref 0–100)

## 2014-10-31 LAB — COMPREHENSIVE METABOLIC PANEL
ALT: 15 U/L (ref 0–35)
AST: 40 U/L — ABNORMAL HIGH (ref 0–37)
Albumin: 2.7 g/dL — ABNORMAL LOW (ref 3.5–5.2)
Alkaline Phosphatase: 83 U/L (ref 39–117)
Anion gap: 14 (ref 5–15)
BUN: 16 mg/dL (ref 6–23)
CALCIUM: 8.8 mg/dL (ref 8.4–10.5)
CO2: 25 meq/L (ref 19–32)
Chloride: 96 mEq/L (ref 96–112)
Creatinine, Ser: 0.79 mg/dL (ref 0.50–1.10)
GFR, EST NON AFRICAN AMERICAN: 78 mL/min — AB (ref >90)
GLUCOSE: 105 mg/dL — AB (ref 70–99)
Potassium: 3 mEq/L — ABNORMAL LOW (ref 3.7–5.3)
Sodium: 135 mEq/L — ABNORMAL LOW (ref 137–147)
Total Bilirubin: 0.7 mg/dL (ref 0.3–1.2)
Total Protein: 6.4 g/dL (ref 6.0–8.3)

## 2014-10-31 LAB — DIFFERENTIAL
Basophils Absolute: 0 10*3/uL (ref 0.0–0.1)
Basophils Relative: 0 % (ref 0–1)
EOS ABS: 0 10*3/uL (ref 0.0–0.7)
Eosinophils Relative: 0 % (ref 0–5)
LYMPHS PCT: 18 % (ref 12–46)
Lymphs Abs: 1 10*3/uL (ref 0.7–4.0)
MONO ABS: 0.9 10*3/uL (ref 0.1–1.0)
Monocytes Relative: 15 % — ABNORMAL HIGH (ref 3–12)
Neutro Abs: 3.9 10*3/uL (ref 1.7–7.7)
Neutrophils Relative %: 67 % (ref 43–77)

## 2014-10-31 LAB — CBC
HEMATOCRIT: 25.3 % — AB (ref 36.0–46.0)
Hemoglobin: 8.2 g/dL — ABNORMAL LOW (ref 12.0–15.0)
MCH: 26.4 pg (ref 26.0–34.0)
MCHC: 32.4 g/dL (ref 30.0–36.0)
MCV: 81.4 fL (ref 78.0–100.0)
Platelets: 231 10*3/uL (ref 150–400)
RBC: 3.11 MIL/uL — ABNORMAL LOW (ref 3.87–5.11)
RDW: 18 % — AB (ref 11.5–15.5)
WBC: 5.8 10*3/uL (ref 4.0–10.5)

## 2014-10-31 LAB — CBG MONITORING, ED: Glucose-Capillary: 90 mg/dL (ref 70–99)

## 2014-10-31 LAB — PROTIME-INR
INR: 1.19 (ref 0.00–1.49)
Prothrombin Time: 15.3 seconds — ABNORMAL HIGH (ref 11.6–15.2)

## 2014-10-31 LAB — APTT: aPTT: 30 seconds (ref 24–37)

## 2014-10-31 LAB — TROPONIN I
TROPONIN I: 0.44 ng/mL — AB (ref <0.30)
TROPONIN I: 0.69 ng/mL — AB (ref <0.30)

## 2014-10-31 MED ORDER — POTASSIUM CHLORIDE 10 MEQ/100ML IV SOLN: 10.0000 meq | INTRAVENOUS | Status: DC

## 2014-10-31 MED ORDER — HYDROCODONE-ACETAMINOPHEN 5-325 MG PO TABS
1.0000 | ORAL_TABLET | Freq: Four times a day (QID) | ORAL | Status: DC | PRN
  Administered 2014-11-01 – 2014-11-02 (×2): 1 via ORAL
  Filled 2014-10-31 (×2): qty 1

## 2014-10-31 MED ORDER — STROKE: EARLY STAGES OF RECOVERY BOOK
Freq: Once | Status: AC
  Administered 2014-10-31: 15:00:00
  Filled 2014-10-31: qty 1

## 2014-10-31 MED ORDER — ASPIRIN 325 MG PO TABS
325.0000 mg | ORAL_TABLET | Freq: Every day | ORAL | Status: DC
  Administered 2014-10-31 – 2014-11-01 (×2): 325 mg via ORAL
  Filled 2014-10-31 (×2): qty 1

## 2014-10-31 MED ORDER — SIMVASTATIN 20 MG PO TABS
20.0000 mg | ORAL_TABLET | Freq: Every day | ORAL | Status: DC
  Administered 2014-10-31 – 2014-11-01 (×2): 20 mg via ORAL
  Filled 2014-10-31 (×2): qty 1

## 2014-10-31 MED ORDER — PANTOPRAZOLE SODIUM 40 MG PO TBEC
40.0000 mg | DELAYED_RELEASE_TABLET | Freq: Every day | ORAL | Status: DC
  Administered 2014-10-31 – 2014-11-02 (×3): 40 mg via ORAL
  Filled 2014-10-31 (×2): qty 1

## 2014-10-31 MED ORDER — POTASSIUM CHLORIDE CRYS ER 20 MEQ PO TBCR
40.0000 meq | EXTENDED_RELEASE_TABLET | Freq: Two times a day (BID) | ORAL | Status: AC
  Administered 2014-10-31 (×2): 40 meq via ORAL
  Filled 2014-10-31 (×2): qty 2

## 2014-10-31 MED ORDER — HYDROCODONE-ACETAMINOPHEN 5-325 MG PO TABS
1.0000 | ORAL_TABLET | Freq: Once | ORAL | Status: AC
  Administered 2014-10-31: 1 via ORAL
  Filled 2014-10-31: qty 1

## 2014-10-31 MED ORDER — HEPARIN SODIUM (PORCINE) 5000 UNIT/ML IJ SOLN
5000.0000 [IU] | Freq: Three times a day (TID) | INTRAMUSCULAR | Status: DC
  Administered 2014-10-31 – 2014-11-02 (×6): 5000 [IU] via SUBCUTANEOUS
  Filled 2014-10-31 (×5): qty 1

## 2014-10-31 MED ORDER — ASPIRIN 300 MG RE SUPP: 300.0000 mg | Freq: Every day | RECTAL | Status: DC

## 2014-10-31 NOTE — H&P (Signed)
Date: 10/31/2014               Patient Name:  Janet GrumblesSusan A Olliff MRN: [id number]  DOB: Jun 17, 1934 Age / Sex: 80 y.o., female   PCP: Samson FredericScott W. D. Hall, MD         Medical Service: Internal Medicine Teaching Service         Attending Physician: Dr. Farley LyJerry Dale Joines, MD    First Contact: Dr. Jill AlexandersAlexa Richardson Pager: [phone]  Second Contact: Dr. Lauris ChromanWoody Jones Pager: [phone]       After Hours (After 5p/  First Contact Pager: [phone]  weekends / holidays): Second Contact Pager: [phone]   Chief Complaint: TIA/Stroke workup  History of Present Illness: Ms. [name] is a 80 y.o. Female who presented to the ED this morning via EMS after having focal neuroogical deficits concerning for a stroke. She woke up at around 0200 this AM to use the bathroom and noted that she was not able to use her left hand to grip the toilet paper or to pull the covers back over her. She went back to sleep and was noted to continue to have these deficits after she had awoken around 0900 this morning. The daughter of the patient noticed that the patient was not acting like herself and was having some difficulty speaking, so she called for EMS to transport the patient to the hospital for a suspected stroke. The patient recently had a TKA on her L side on 10/26/14. She did not suffer from any complications from the procedure. She takes 325mg  of ASA QD, but admits that she did not take it this morning. The patient feels that her speech has improved from this morning, but feels that her grip strength and numbness remain unchanged. She denies any recent chest pain, shob, nausea, vomiting, or headaches.   Meds: Current Facility-Administered Medications  Medication Dose Route Frequency Provider Last Rate Last Dose  . potassium chloride SA (K-DUR,KLOR-CON) CR tablet 40 mEq  40 mEq Oral BID Courtney ParisEden W Jones, MD        Allergies: Allergies as of 10/31/2014  . (No Known Allergies)   Past Medical History  Diagnosis Date  . Family history  of anesthesia complication     Daughter; takes a long time to wake up  . Hypertension   . Shortness of breath     while walking  . Frequency of urination     at time  . GERD (gastroesophageal reflux disease)   . Arthritis   . Hyperlipemia   . Atrial fibrillation 2010    Pt had mitral valve replacement in 2010 that put pt back into NSR.   Past Surgical History  Procedure Laterality Date  . Mitral valve repair    . Abdominal hysterectomy    . Eye surgery Bilateral     cataract removed; lens implant  . Total knee arthroplasty Right 06/03/2014    Procedure: RIGHT TOTAL KNEE ARTHROPLASTY ;  Surgeon: Harvie JuniorJohn L Graves, MD;  Location: [facility] OR;  Service: Orthopedics;  Laterality: Right;  . Total knee arthroplasty Left 10/26/2014    Procedure: LEFT TOTAL KNEE ARTHROPLASTY;  Surgeon: Harvie JuniorJohn L Graves, MD;  Location: [facility] OR;  Service: Orthopedics;  Laterality: Left;  . Cholecystectomy     Family History  Problem Relation Age of Onset  . Hypertension Mother   . Hyperlipidemia Mother   . Hypertension Father    History   Social History  . Marital Status: Widowed    Spouse Name:  N/A    Number of Children: N/A  . Years of Education: N/A   Occupational History  . Not on file.   Social History Main Topics  . Smoking status: Former Games developermoker  . Smokeless tobacco: Not on file  . Alcohol Use: No  . Drug Use: No  . Sexual Activity: Not on file   Other Topics Concern  . Not on file   Social History Narrative    Review of Systems: A comprehensive review of systems was negative except for: Eyes: positive for cataracts Musculoskeletal: positive for stiff joints and bilateral TKAs Neurological: positive for dizziness  Physical Exam: Blood pressure 149/89, pulse 90, temperature 98.5 F (36.9 C), temperature source Oral, resp. rate 19, height 4\' 11"  (1.499 m), weight 61.236 kg (135 lb), SpO2 99 %. BP 149/89 mmHg  Pulse 90  Temp(Src) 98.5 F (36.9 C) (Oral)  Resp 19  Ht 4\' 11"  (1.499 m)  Wt  61.236 kg (135 lb)  BMI 27.25 kg/m2  SpO2 99%   Vitals were reviewed and are stable General appearance: alert, cooperative and no distress Head: Normocephalic, without obvious abnormality, atraumatic. CN II-XII grossly intact Eyes: conjunctivae/corneas clear. PERRL, EOM's intact. Fundi benign. Neck: no adenopathy, no carotid bruit, no JVD and supple, symmetrical, trachea midline Lungs: clear to auscultation bilaterally Heart: Normal rate, irregular rhythm. Extra heart sound heard concurrently with S2. Possibly a rub Abdomen: soft, non-tender; bowel sounds normal; no masses,  no organomegaly  Neuro: Equal strength throughout except for L grip strength which was 3/5. Sensation reduced along the L distal phalanges of hand  Lab results:  Basic Metabolic Panel:  Recent Labs  16/09/9610/16/15 1038 10/31/14 1046  NA 135* 135*  K 3.0* 2.8*  CL 96 96  CO2 25  --   GLUCOSE 105* 105*  BUN 16 14  CREATININE 0.79 0.80  CALCIUM 8.8  --    Liver Function Tests:  Recent Labs  10/31/14 1038  AST 40*  ALT 15  ALKPHOS 83  BILITOT 0.7  PROT 6.4  ALBUMIN 2.7*   CBC:  Recent Labs  10/31/14 1038 10/31/14 1046  WBC 5.8  --   NEUTROABS 3.9  --   HGB 8.2* 8.8*  HCT 25.3* 26.0*  MCV 81.4  --   PLT 231  --    CBG:  Recent Labs  10/31/14 1102  GLUCAP 90   Coagulation:  Recent Labs  10/31/14 1038  LABPROT 15.3*  INR 1.19   Imaging results:  Ct Head (brain) Wo Contrast  10/31/2014   CLINICAL DATA:  Code stroke.  Left hand numbness.  EXAM: CT HEAD WITHOUT CONTRAST  TECHNIQUE: Contiguous axial images were obtained from the base of the skull through the vertex without intravenous contrast.  COMPARISON:  None.  FINDINGS: Focal area of low attenuation within the left basal ganglia compatible with subacute to chronic lacunar infarct. There is mild patchy low attenuation within the periventricular and subcortical white matter compatible with chronic microvascular disease. The mastoid air  cells and the paranasal sinuses are clear. The calvarium appears intact.  IMPRESSION: 1. No acute intracranial abnormalities. 2. Chronic small vessel ischemic disease.   Electronically Signed   By: Signa Kellaylor  Stroud M.D.   On: 10/31/2014 10:37    Other results: EKG: frequent PVCs noted, unifocal.  Assessment & Plan by Problem: Active Problems:   CVA (cerebral infarction)  80 y.o. Female with PMHx significant for HTN, HLD, and atrial fibrillation who presented to the ED with focal neurological deficits  concerning for a CVA.  1.) CVA: Head CT was negative. Awaiting MRA. Likely an ischemic stroke given continued focal neurological deficit and predisposing RFs.   -  Restart home ASA 325mg  qd  -  Obtain an echocardiogram to evaluate potential embolic source  -  Obtain b/l carotid artery U/S to assess atherosclerotic potential  -  Pending MRA results  -  PT/OT/SLP  -  Telemetry  -  Trops x 3  -  Allow permissive hypertension  -  Hemoglobin A1c  -  Lipid panel  -  Neuro checks  2.) HTN: 149/89 at admission. Mildly elevated. Did not take meds this morning  -  Hold home meds to allow for permissive hypertension.  3.) HLD: Did not take meds this morning  -  Restart home Simvastatin 20mg  QD  -  No lipid panel since 2008. Plan to obtain one this hospitalization.  4.) Atrial fibrillation: Likely does not have. Was likely MVR d/t Valvuloplasty 5-6 years ago. EKG showing frequent PVCs. Has normal rate and irregular rhythm on exam. Cannot find documentation of actual proof of Atrial fibrillation.  -  Hold home metoprolol to allow for permissive hypertension  -  Echocardiogram to assess for valvulopathy/accessory heart sound  5.) GERD: Patient takes omeprazole 20mg  QD  -  Pantoprazole 40mg  QD  6.) DVT/PE Ppx: Heparin SQ TID  Dispo: Disposition is deferred at this time, awaiting improvement of current medical problems. Anticipated discharge in approximately 2-3 day(s).   The patient does have a  current PCP Samson Frederic, MD) and does not need an [facility] hospital follow-up appointment after discharge.  The patient does not have transportation limitations that hinder transportation to clinic appointments.  Signed: Charlotta Newton, Med Student 10/31/2014, 1:39 PM

## 2014-10-31 NOTE — Progress Notes (Signed)
Orthopedic Tech Progress Note Patient Details:  Janet GrumblesSusan A [name] 1934-03-18 [id number] Applied CPM to LLE. CPM Left Knee CPM Left Knee: On Left Knee Flexion (Degrees): 70 Left Knee Extension (Degrees): 0   Lesle ChrisGilliland, Yliana Gravois L 10/31/2014, 7:28 PM

## 2014-10-31 NOTE — ED Provider Notes (Addendum)
CSN: [id number]     Arrival date & time 10/31/14  1014 History   First MD Initiated Contact with Patient 10/31/14 1015     Chief Complaint  Patient presents with  . Code Stroke    Pt seen by myself at [facility] on arrival   HPI   PCP is Dr. Mirna Mires at [facility].  Patient presents via EMS from home. She just recently was discharged after left total knee arthroplasty. Left hospital 13th to home. Last known normal 02:00.  At awakened had decreased sensation and strength in her left upper extremity. Presents here at 1014 some 8 hours later with persistence of symptoms.  History of stroke. No history of arrhythmia. History of hypercholesterolemia, hypertension, arthritis, mitral valve surgery. Does not have a mechanical valve.  Past Medical History  Diagnosis Date  . Family history of anesthesia complication     Daughter; takes a long time to wake up  . Hypertension   . Shortness of breath     while walking  . Frequency of urination     at time  . GERD (gastroesophageal reflux disease)   . Arthritis   . Hyperlipemia   . Atrial fibrillation 2010    Pt had mitral valve replacement in 2010 that put pt back into NSR.   Past Surgical History  Procedure Laterality Date  . Mitral valve repair    . Abdominal hysterectomy    . Eye surgery Bilateral     cataract removed; lens implant  . Total knee arthroplasty Right 06/03/2014    Procedure: RIGHT TOTAL KNEE ARTHROPLASTY ;  Surgeon: Harvie Junior, MD;  Location: [facility] OR;  Service: Orthopedics;  Laterality: Right;  . Total knee arthroplasty Left 10/26/2014    Procedure: LEFT TOTAL KNEE ARTHROPLASTY;  Surgeon: Harvie Junior, MD;  Location: [facility] OR;  Service: Orthopedics;  Laterality: Left;  . Cholecystectomy     Family History  Problem Relation Age of Onset  . Hypertension Mother   . Hyperlipidemia Mother   . Hypertension Father    History  Substance Use Topics  . Smoking status: Former Games developer  . Smokeless tobacco: Not on  file  . Alcohol Use: No   OB History    No data available     Review of Systems  Constitutional: Negative for fever, chills, diaphoresis, appetite change and fatigue.  HENT: Negative for mouth sores, sore throat and trouble swallowing.   Eyes: Negative for visual disturbance.  Respiratory: Negative for cough, chest tightness, shortness of breath and wheezing.   Cardiovascular: Negative for chest pain.  Gastrointestinal: Negative for nausea, vomiting, abdominal pain, diarrhea and abdominal distention.  Endocrine: Negative for polydipsia, polyphagia and polyuria.  Genitourinary: Negative for dysuria, frequency and hematuria.  Musculoskeletal: Negative for gait problem.       Minimal pain in her knee.  Skin: Negative for color change, pallor and rash.  Neurological: Positive for weakness. Negative for dizziness, syncope, light-headedness and headaches.       Weakness left upper extremity.  Hematological: Does not bruise/bleed easily.  Psychiatric/Behavioral: Negative for behavioral problems and confusion.      Allergies  Review of patient's allergies indicates no known allergies.  Home Medications   Prior to Admission medications   Medication Sig Start Date End Date Taking? Authorizing Provider  acetaminophen (TYLENOL) 650 MG CR tablet Take 650 mg by mouth daily as needed for pain.     Historical Provider, MD  Ascorbic Acid (VITAMIN C PO) Take 1  tablet by mouth daily.    Historical Provider, MD  aspirin EC 325 MG tablet Take 1 tablet (325 mg total) by mouth 2 (two) times daily after a meal. 10/26/14   Matthew FolksJames G Bethune, PA-C  dicyclomine (BENTYL) 10 MG capsule Take 10 mg by mouth daily before lunch.    Historical Provider, MD  diltiazem (DILACOR XR) 240 MG 24 hr capsule Take 240 mg by mouth daily.    Historical Provider, MD  HYDROcodone-acetaminophen (NORCO) 5-325 MG per tablet Take 1-2 tablets by mouth every 6 (six) hours as needed for moderate pain. 10/28/14   Matthew FolksJames G Bethune,  PA-C  lisinopril-hydrochlorothiazide (PRINZIDE,ZESTORETIC) 20-12.5 MG per tablet Take 1 tablet by mouth daily.    Historical Provider, MD  methocarbamol (ROBAXIN) 500 MG tablet Take 1 tablet (500 mg total) by mouth every 8 (eight) hours as needed for muscle spasms. 10/26/14   Matthew FolksJames G Bethune, PA-C  metoprolol tartrate (LOPRESSOR) 25 MG tablet Take 25 mg by mouth daily.    Historical Provider, MD  omeprazole (PRILOSEC) 20 MG capsule Take 20 mg by mouth daily.    Historical Provider, MD  Oxymetazoline HCl (AFRIN NASAL SPRAY NA) Place 2-3 sprays into both nostrils daily as needed (congestion).     Historical Provider, MD  Polyethyl Glycol-Propyl Glycol (SYSTANE OP) Place 2 drops into both eyes daily.     Historical Provider, MD  simvastatin (ZOCOR) 20 MG tablet Take 20 mg by mouth daily.    Historical Provider, MD   BP 143/74 mmHg  Pulse 83  Temp(Src) 98.5 F (36.9 C) (Oral)  Resp 18  Ht 4\' 11"  (1.499 m)  Wt 135 lb (61.236 kg)  BMI 27.25 kg/m2  SpO2 99% Physical Exam  Constitutional: She is oriented to person, place, and time. She appears well-developed and well-nourished. No distress.  HENT:  Head: Normocephalic.  Eyes: Conjunctivae are normal. Pupils are equal, round, and reactive to light. No scleral icterus.  Neck: Normal range of motion. Neck supple. No thyromegaly present.  Cardiovascular: Normal rate and regular rhythm.  Exam reveals no gallop and no friction rub.   No murmur heard. Pulmonary/Chest: Effort normal and breath sounds normal. No respiratory distress. She has no wheezes. She has no rales.  Abdominal: Soft. Bowel sounds are normal. She exhibits no distension. There is no tenderness. There is no rebound.  Musculoskeletal: Normal range of motion.       Legs: Neurological: She is alert and oriented to person, place, and time.  Awake alert. Cranial nerves intact and symmetric. Reports decreased sensation left upper extremity. Weak grip strength. However no frank pronator  drift. Otherwise normal neurological exam. Left fluctuant exam limited by her incision  Skin: Skin is warm and dry. No rash noted.  Psychiatric: She has a normal mood and affect. Her behavior is normal.    ED Course  Procedures (including critical care time) Labs Review Labs Reviewed  CBC - Abnormal; Notable for the following:    RBC 3.11 (*)    Hemoglobin 8.2 (*)    HCT 25.3 (*)    RDW 18.0 (*)    All other components within normal limits  DIFFERENTIAL - Abnormal; Notable for the following:    Monocytes Relative 15 (*)    All other components within normal limits  I-STAT CHEM 8, ED - Abnormal; Notable for the following:    Sodium 135 (*)    Potassium 2.8 (*)    Glucose, Bld 105 (*)    Calcium, Ion 1.11 (*)  Hemoglobin 8.8 (*)    HCT 26.0 (*)    All other components within normal limits  I-STAT TROPOININ, ED - Abnormal; Notable for the following:    Troponin i, poc 0.16 (*)    All other components within normal limits  PROTIME-INR  APTT  COMPREHENSIVE METABOLIC PANEL  CBG MONITORING, ED    Imaging Review Ct Head (brain) Wo Contrast  10/31/2014   CLINICAL DATA:  Code stroke.  Left hand numbness.  EXAM: CT HEAD WITHOUT CONTRAST  TECHNIQUE: Contiguous axial images were obtained from the base of the skull through the vertex without intravenous contrast.  COMPARISON:  None.  FINDINGS: Focal area of low attenuation within the left basal ganglia compatible with subacute to chronic lacunar infarct. There is mild patchy low attenuation within the periventricular and subcortical white matter compatible with chronic microvascular disease. The mastoid air cells and the paranasal sinuses are clear. The calvarium appears intact.  IMPRESSION: 1. No acute intracranial abnormalities. 2. Chronic small vessel ischemic disease.   Electronically Signed   By: Signa Kellaylor  Stroud M.D.   On: 10/31/2014 10:37     EKG Interpretation   Date/Time:  Monday October 31 2014 10:36:00 EST Ventricular Rate:   93 PR Interval:  135 QRS Duration: 111 QT Interval:  387 QTC Calculation: 481 R Axis:   -44 Text Interpretation:  Sinus tachycardia Multiform ventricular premature  complexes Left axis deviation Confirmed by Fayrene FearingJAMES  MD, Amogh Komatsu ([id number]) on  10/31/2014 11:03:20 AM      MDM   Final diagnoses:  Cerebral infarction due to unspecified mechanism    CT shows no acute findings. Has persistence of symptoms. Well outside the window for intervention. Has been seen and evaluated by neurology. Plan will be admission. MRI.  Discussed with teaching service. Patient will be admitted.    Rolland PorterMark Lasandra Batley, MD 10/31/14 1116  Rolland PorterMark Omolara Carol, MD 11/04/14 1537  Rolland PorterMark Tenea Sens, MD 11/09/14 1337  Rolland PorterMark Jalayna Josten, MD 11/09/14 1339  Rolland PorterMark Maygan Koeller, MD 11/15/14 [phone]

## 2014-10-31 NOTE — H&P (Signed)
Date: 10/31/2014               Patient Name:  [name] MRN: [id number]  DOB: 1934/11/08 Age / Sex: 80 y.o., female   PCP: Samson FredericScott W. D. Hall, MD         Medical Service: Internal Medicine Teaching Service         Attending Physician: Dr. Farley LyJerry Dale Joines, MD    First Contact: Dr. Senaida Oresichardson Pager: [phone]  Second Contact: Dr. Yetta BarreJones Pager: [phone]       After Hours (After 5p/  First Contact Pager: [phone]  weekends / holidays): Second Contact Pager: [phone]   Chief Complaint: L facial droop, L hand weakness and numbness  History of Present Illness: [name] is a 80 yo female with PMHx of HTN, HLD, OA s/p R knee replacement in June 2015 and L knee replacement on 10/26/14 who presented to the ED with complaints of left hand weakness and numbness. Patient stated she awoke around 0200 this morning and got up to use the bathroom. She noticed it was hard for her to get the toilet paper. She then noticed left hand weakness and numbness when she tried to pull up her blankets in bed. She later spilled water on the floor since her grip was poor. Patient stayed in bed until her daughter was leaving for work. The daughter noted the left hand weakness and without improvement decided to call EMS to bring her mother into the ED. Patient states her strength and numbness are improved from prior, but she continues to have residual weakness in left hand. Patient has no history of prior stroke or MI. She was a previous smoker but quit 5 years ago. She denies alcohol or illicit drug use. Patient denies all other symptoms. She denies hearing or vision loss, trouble speaking or swallowing, weakness or numbness in any other extremity. Denies dizziness, headache, chest pain, shortness of breath, nausea, vomiting or diarrhea.   Meds: Current Facility-Administered Medications  Medication Dose Route Frequency Provider Last Rate Last Dose  .  stroke: mapping our early stages of recovery book   Does not apply  Once Courtney ParisEden W Jones, MD      . aspirin suppository 300 mg  300 mg Rectal Daily Courtney ParisEden W Jones, MD       Or  . aspirin tablet 325 mg  325 mg Oral Daily Courtney ParisEden W Jones, MD   325 mg at 10/31/14 1437  . heparin injection 5,000 Units  5,000 Units Subcutaneous 3 times per day Courtney ParisEden W Jones, MD   5,000 Units at 10/31/14 1437  . HYDROcodone-acetaminophen (NORCO/VICODIN) 5-325 MG per tablet 1-2 tablet  1-2 tablet Oral Q6H PRN Courtney ParisEden W Jones, MD      . pantoprazole (PROTONIX) EC tablet 40 mg  40 mg Oral Daily Courtney ParisEden W Jones, MD   40 mg at 10/31/14 1437  . potassium chloride SA (K-DUR,KLOR-CON) CR tablet 40 mEq  40 mEq Oral BID Courtney ParisEden W Jones, MD   40 mEq at 10/31/14 1437  . simvastatin (ZOCOR) tablet 20 mg  20 mg Oral q1800 Courtney ParisEden W Jones, MD        Allergies: Allergies as of 10/31/2014  . (No Known Allergies)   Past Medical History  Diagnosis Date  . Family history of anesthesia complication     Daughter; takes a long time to wake up  . Hypertension   . Shortness of breath     while walking  . Frequency of urination  at time  . GERD (gastroesophageal reflux disease)   . Arthritis   . Hyperlipemia   . Atrial fibrillation 2010    Pt had mitral valve replacement in 2010 that put pt back into NSR.   Past Surgical History  Procedure Laterality Date  . Mitral valve repair    . Abdominal hysterectomy    . Eye surgery Bilateral     cataract removed; lens implant  . Total knee arthroplasty Right 06/03/2014    Procedure: RIGHT TOTAL KNEE ARTHROPLASTY ;  Surgeon: Harvie Junior, MD;  Location: [facility] OR;  Service: Orthopedics;  Laterality: Right;  . Total knee arthroplasty Left 10/26/2014    Procedure: LEFT TOTAL KNEE ARTHROPLASTY;  Surgeon: Harvie Junior, MD;  Location: [facility] OR;  Service: Orthopedics;  Laterality: Left;  . Cholecystectomy     Family History  Problem Relation Age of Onset  . Hypertension Mother   . Hyperlipidemia Mother   . Hypertension Father    History   Social History  . Marital  Status: Widowed    Spouse Name: N/A    Number of Children: N/A  . Years of Education: N/A   Occupational History  . Not on file.   Social History Main Topics  . Smoking status: Former Games developer  . Smokeless tobacco: Not on file  . Alcohol Use: No  . Drug Use: No  . Sexual Activity: Not on file   Other Topics Concern  . Not on file   Social History Narrative   Review of Systems: General: Denies fever, chills, fatigue, change in appetite and diaphoresis.  Respiratory: Denies SOB, cough, DOE, chest tightness, and wheezing.   Cardiovascular: Denies chest pain and palpitations.  Gastrointestinal: Denies nausea, vomiting, abdominal pain, diarrhea, constipation, blood in stool and abdominal distention.  Genitourinary: Denies dysuria, urgency, frequency, hematuria, suprapubic pain and flank pain. Endocrine: Denies hot or cold intolerance, polyuria, and polydipsia. Musculoskeletal: Admits to left hand weakness. Denies myalgias, back pain, joint swelling, arthralgias and gait problem.  Skin: Denies pallor, rash and wounds.  Neurological: Admits to left hand weakness. Denies dizziness, headaches, lightheadedness, numbness,seizures, and syncope, Psychiatric/Behavioral: Denies mood changes, confusion, nervousness, sleep disturbance and agitation.  Physical Exam: Filed Vitals:   10/31/14 1150 10/31/14 1200 10/31/14 1215 10/31/14 1412  BP: 135/88 148/80 149/89 140/89  Pulse:   90 111  Temp:    97.5 F (36.4 C)  TempSrc:    Axillary  Resp: 16 25 19 18   Height:      Weight:      SpO2:   99% 99%   General: Vital signs reviewed.  Patient is well-developed and well-nourished, in no acute distress and cooperative with exam.  Head: Normocephalic and atraumatic. Eyes: EOMI, conjunctivae normal, no scleral icterus.  Neck: Supple, trachea midline, normal ROM, no JVD, masses, thyromegaly, or carotid bruit present.  Cardiovascular: Regular rate, irregular rhythm, S1 normal, S2 normal, no murmurs,  gallops, or rubs. Pulmonary/Chest: Clear to auscultation bilaterally, no wheezes, rales, or rhonchi. Abdominal: Soft, non-tender, non-distended, BS +, no masses, organomegaly, or guarding present.  Musculoskeletal: R knee scar, well healed. L knee well bandaged.  Extremities: No lower extremity edema bilaterally,  pulses symmetric and intact bilaterally. No cyanosis or clubbing. Neurological: A&O x3, 4/5 Strength in LUE, RUE, and RLE is normal. Strenght in LLE is difficult to assess 2/2 to pain from recent surgery, cranial nerve II-XII are grossly intact, no focal motor deficit, sensory intact to light touch bilaterally.  Skin: Warm, dry and intact.  No rashes or erythema. Psychiatric: Normal mood and affect. speech and behavior is normal. Cognition and memory are normal.   Neurologic Exam:   Mental Status: Alert, oriented, thought content appropriate.  Speech fluent without evidence of aphasia. Able to follow 3 step commands without difficulty.  Cranial Nerves:   II: Visual fields grossly intact.  III/IV/VI: Extraocular movements intact.  Pupils reactive bilaterally.  V/VII: Smile symmetric. facial light touch sensation normal bilaterally.  XI: Bilateral shoulder shrug normal.  XII: Midline tongue extension normal.  Motor:  4/5 in LUE, 5/5 in RUE with normal tone and bulk  Sensory:  Pinprick and light touch intact throughout, bilaterally  Plantars:  Downgoing bilaterally      Lab results: Basic Metabolic Panel:  Recent Labs  16/10/96 1038 10/31/14 1046  NA 135* 135*  K 3.0* 2.8*  CL 96 96  CO2 25  --   GLUCOSE 105* 105*  BUN 16 14  CREATININE 0.79 0.80  CALCIUM 8.8  --    Liver Function Tests:  Recent Labs  10/31/14 1038  AST 40*  ALT 15  ALKPHOS 83  BILITOT 0.7  PROT 6.4  ALBUMIN 2.7*   CBC:  Recent Labs  10/31/14 1038 10/31/14 1046  WBC 5.8  --   NEUTROABS 3.9  --   HGB 8.2* 8.8*  HCT 25.3* 26.0*  MCV 81.4  --   PLT 231  --    CBG:  Recent Labs   10/31/14 1102  GLUCAP 90   Coagulation:  Recent Labs  10/31/14 1038  LABPROT 15.3*  INR 1.19   Imaging results:  Ct Head (brain) Wo Contrast  10/31/2014   CLINICAL DATA:  Code stroke.  Left hand numbness.  EXAM: CT HEAD WITHOUT CONTRAST  TECHNIQUE: Contiguous axial images were obtained from the base of the skull through the vertex without intravenous contrast.  COMPARISON:  None.  FINDINGS: Focal area of low attenuation within the left basal ganglia compatible with subacute to chronic lacunar infarct. There is mild patchy low attenuation within the periventricular and subcortical white matter compatible with chronic microvascular disease. The mastoid air cells and the paranasal sinuses are clear. The calvarium appears intact.  IMPRESSION: 1. No acute intracranial abnormalities. 2. Chronic small vessel ischemic disease.   Electronically Signed   By: Signa Kell M.D.   On: 10/31/2014 10:37   Other results: EKG: sinus tachycardia, frequent PVC's noted.  Assessment & Plan by Problem: Active Problems:   CVA (cerebral infarction)   Acute ischemic stroke  Possible Acute CVA: Patient presented with new onset left hand weakness and numbness. CT head showed no acute intracranial abnormalities and chronic small vessel ischemic disease. Patient is out of the tPA window due to time and due to recent left knee total replacement. Patient was seen by neurology who recommend full neuro work up. Patient is normally ASA 325 mg BID at home after her recent surgery. She has no prior history of stroke. -ASA 325 mg one -Simvastatin 20 mg daily -2D echo -Carotid Duplex -Cardiac Diet -Hemoglobin A1c -MRI/MRA -Lipid Panel -Neuro checks -NIH Stroke Scale -PT/OT/SLP  -Telemetry -Trops x 3 -Permissive HTN  MVR s/p Valve Repair:  Patient had valve repair 5-6 years ago and has done well. Patient has an irregular rhythm on auscultation and on EKG. EKG shows frequent PVCs. Patient denies any known history  of atrial fibrillation, nor is there a record of this in her chart. However, patient is on diltiazem 240 mg daily, metoprolol 25 mg  daily at home.  -Cardiac monitor -Hold metoprolol and diltiazem to allow for permissive hypertension  HTN: BP on admission 143/74. Patient is normally on diltiazem 240 mg daily, lisinopril-HCTZ 20-12.5 mg daily and metoprolol 25 mg daily at home. -Hold meds for permissive HTN  HLD: Most recent lipid panel on 08/13/2007 shows cholesterol of 130, TG 74, HDL 65, LDL 50. Patient is on simvastatin 20 mg daily.  -Simvastatin 20 mg daily  -Lipid panel  GERD: Patient is on omeprazole 20 mg daily at home. Patient denies nausea or vomiting. -Pantoprazole 40 mg daily  DVT/PE ppx:  Heparin SQ TID  Dispo: Disposition is deferred at this time, awaiting improvement of current medical problems. Anticipated discharge in approximately 1-2 day(s).   The patient does have a current PCP Samson Frederic([facility] W. D. Hall, MD) and [facility] not [facility] after discharge.  The patient does not have transportation limitations that hinder transportation to clinic appointments.  Signed: Jill AlexandersAlexa Richardson, DO PGY-1 Internal Medicine Resident Pager # [phone] 10/31/2014 3:55 PM

## 2014-10-31 NOTE — Code Documentation (Signed)
80[id number].  Patient reports that she was awake at 0200 and noticed that her left hand was numb.  She lives at home with her daughter, but did not report this to her daughter at that time.  Later this morning her daughter noticed her to have a left facial droop and EMS was activated.  EMS activated a Code Stroke for left facial droop and left hand weakness.  Patient taken to CT on arrival.  Stroke team at the bedside.  NIHSS 4, see documentation for details and code stroke times.  LKW 0200.  Patient with h/o left knee replacement last week.  Patient with continued left hand weakness and mild left facial droop.  Patient reports left face and arm decreased sensation.  Patient is outside the window for treatment with tPA.  No acute stroke treatment at this time per Dr. Amada JupiterKirkpatrick. Bedside handoff with ED RN Irving BurtonEmily.

## 2014-10-31 NOTE — Progress Notes (Signed)
CRITICAL VALUE ALERT  Critical value received:  Triponin  Date of notification:  10/31/14  Time of notification:  2341  Critical value read back:Yes.     Nurse who received alert:  Abe PeopleStephanie Shaneca Orne  MD notified (1st page):  Mallory [phone]  Time of first page:  2341  MD notified (2nd page):  Time of second page:  Responding MD:  Leatha GildingMallory  Time MD responded:  [phone]

## 2014-10-31 NOTE — Progress Notes (Signed)
Patient admitted from the ED for TIA/stroke work up. Alert and oriented. Placed on cardiac monitoring and call bell placed within reach.

## 2014-10-31 NOTE — ED Notes (Signed)
Pt presents from home with c/o stroke symptoms. Pt has left hand grip weakness and decreased sensation to LUE. Pt is A&Ox4 and in  NAD. LSN 0200 this morning. Pt had total left knee replacement last Wednesday.

## 2014-10-31 NOTE — Consult Note (Signed)
Referring Physician: Fayrene FearingJames    Chief Complaint: Code Stroke  HPI:                                                                                                                                         [name] is an 80 y.o. female who was awake at 0200 hours when daughter noted left facial droop and patient stated she had decreased sensation in her left hand and weakness in her left hand. EMS was called and patient was brought to ED as code stroke. On arrival she was 8 hours out and not a tPA or intervention candidate.  Currently her only complaint is slight decreased sensation on the left face and left hand. Initially it was though that patient was in new onset Afib but after looking closely at rhythm strip it is NSR.   Date last known well: Date: 10/31/2014 Time last known well: Time: 02:00 tPA Given: No: out of window NIHSS 4  Past Medical History  Diagnosis Date  . Family history of anesthesia complication     Daughter; takes a long time to wake up  . Hypertension   . Shortness of breath     while walking  . Frequency of urination     at time  . GERD (gastroesophageal reflux disease)   . Arthritis   . Hyperlipemia     Past Surgical History  Procedure Laterality Date  . Mitral valve repair    . Abdominal hysterectomy    . Eye surgery Bilateral     cataract removed; lens implant  . Total knee arthroplasty Right 06/03/2014    Procedure: RIGHT TOTAL KNEE ARTHROPLASTY ;  Surgeon: Harvie JuniorJohn L Graves, MD;  Location: [facility] OR;  Service: Orthopedics;  Laterality: Right;  . Total knee arthroplasty Left 10/26/2014    Procedure: LEFT TOTAL KNEE ARTHROPLASTY;  Surgeon: Harvie JuniorJohn L Graves, MD;  Location: [facility] OR;  Service: Orthopedics;  Laterality: Left;    Family History  Problem Relation Age of Onset  . Hypertension Mother   . Hyperlipidemia Mother   . Hypertension Father    Social History:  reports that she has quit smoking. She does not have any smokeless tobacco history on file. She  reports that she does not drink alcohol or use illicit drugs.  Allergies: No Known Allergies  Medications:  No current facility-administered medications for this encounter.   Current Outpatient Prescriptions  Medication Sig Dispense Refill  . acetaminophen (TYLENOL) 650 MG CR tablet Take 650 mg by mouth daily as needed for pain.     . Ascorbic Acid (VITAMIN C PO) Take 1 tablet by mouth daily.    Marland Kitchen aspirin EC 325 MG tablet Take 1 tablet (325 mg total) by mouth 2 (two) times daily after a meal. 60 tablet 0  . dicyclomine (BENTYL) 10 MG capsule Take 10 mg by mouth daily before lunch.    . diltiazem (DILACOR XR) 240 MG 24 hr capsule Take 240 mg by mouth daily.    Marland Kitchen HYDROcodone-acetaminophen (NORCO) 5-325 MG per tablet Take 1-2 tablets by mouth every 6 (six) hours as needed for moderate pain. 40 tablet 0  . lisinopril-hydrochlorothiazide (PRINZIDE,ZESTORETIC) 20-12.5 MG per tablet Take 1 tablet by mouth daily.    . methocarbamol (ROBAXIN) 500 MG tablet Take 1 tablet (500 mg total) by mouth every 8 (eight) hours as needed for muscle spasms. 40 tablet 0  . metoprolol tartrate (LOPRESSOR) 25 MG tablet Take 25 mg by mouth daily.    Marland Kitchen omeprazole (PRILOSEC) 20 MG capsule Take 20 mg by mouth daily.    . Oxymetazoline HCl (AFRIN NASAL SPRAY NA) Place 2-3 sprays into both nostrils daily as needed (congestion).     Bertram Gala Glycol-Propyl Glycol (SYSTANE OP) Place 2 drops into both eyes daily.     . simvastatin (ZOCOR) 20 MG tablet Take 20 mg by mouth daily.       ROS:                                                                                                                                       History obtained from the patient  General ROS: negative for - chills, fatigue, fever, night sweats, weight gain or weight loss Psychological ROS: negative for - behavioral disorder,  hallucinations, memory difficulties, mood swings or suicidal ideation Ophthalmic ROS: negative for - blurry vision, double vision, eye pain or loss of vision ENT ROS: negative for - epistaxis, nasal discharge, oral lesions, sore throat, tinnitus or vertigo Allergy and Immunology ROS: negative for - hives or itchy/watery eyes Hematological and Lymphatic ROS: negative for - bleeding problems, bruising or swollen lymph nodes Endocrine ROS: negative for - galactorrhea, hair pattern changes, polydipsia/polyuria or temperature intolerance Respiratory ROS: negative for - cough, hemoptysis, shortness of breath or wheezing Cardiovascular ROS: negative for - chest pain, dyspnea on exertion, edema or irregular heartbeat Gastrointestinal ROS: negative for - abdominal pain, diarrhea, hematemesis, nausea/vomiting or stool incontinence Genito-Urinary ROS: negative for - dysuria, hematuria, incontinence or urinary frequency/urgency Musculoskeletal ROS: negative for - joint swelling or muscular weakness Neurological ROS: as noted in HPI Dermatological ROS: negative for rash and skin lesion changes  Neurologic Examination:  Blood pressure 143/74, pulse 83, temperature 98.5 F (36.9 C), temperature source Oral, resp. rate 18, height 4\' 11"  (1.499 m), weight 61.236 kg (135 lb), SpO2 99 %.  General: NAD Mental Status: Alert, oriented, thought content appropriate.  Speech fluent without evidence of aphasia.  Able to follow 3 step commands without difficulty. Cranial Nerves: II: Discs flat bilaterally; Visual fields grossly normal, pupils equal, round, reactive to light and accommodation III,IV, VI: ptosis not present, extra-ocular motions intact bilaterally V,VII: left sided mild facial weakness,  facial light touch sensation decreased on the left lower face VIII: hearing normal bilaterally IX,X: gag reflex  present XI: bilateral shoulder shrug XII: midline tongue extension without atrophy or fasciculations  Motor: Right : Upper extremity   5/5    Left:     Upper extremity   5/5 proximally, 4/5 distally  Lower extremity   5/5     Lower extremity   4/5 post surgical knee Tone and bulk:normal tone throughout; no atrophy noted Sensory: Pinprick and light touch decreased on the left arm Deep Tendon Reflexes:  Right: Upper Extremity   Left: Upper extremity   biceps (C-5 to C-6) 2/4   biceps (C-5 to C-6) 2/4 tricep (C7) 2/4    triceps (C7) 2/4 Brachioradialis (C6) 2/4  Brachioradialis (C6) 2/4  Lower Extremity Lower Extremity  quadriceps (L-2 to L-4) 2/4   quadriceps (L-2 to L-4) --post surgical and not tested Achilles (S1) 1/4   Achilles (S1) 1/4  Plantars: Right: downgoing   Left: downgoing Cerebellar: normal finger-to-nose,  normal heel-to-shin test on the right unable to do on the left due to recent surgery Gait: not tested due to safety CV: pulses palpable throughout    Lab Results: Basic Metabolic Panel:  Recent Labs Lab 10/26/14 1119 10/27/14 0500 10/28/14 0409  NA 142 141 137  K 3.7 3.3* 3.2*  CL  --  101 97  CO2  --  23 22  GLUCOSE 88 106* 112*  BUN  --  11 13  CREATININE  --  0.90 0.85  CALCIUM  --  8.0* 8.3*    Liver Function Tests: No results for input(s): AST, ALT, ALKPHOS, BILITOT, PROT, ALBUMIN in the last 168 hours. No results for input(s): LIPASE, AMYLASE in the last 168 hours. No results for input(s): AMMONIA in the last 168 hours.  CBC:  Recent Labs Lab 10/26/14 1119 10/27/14 0500 10/28/14 0409  WBC  --  7.3 11.4*  HGB 13.3 9.2* 8.6*  HCT 39.0 28.0* 26.0*  MCV  --  80.0 79.8  PLT  --  169 146*    Cardiac Enzymes: No results for input(s): CKTOTAL, CKMB, CKMBINDEX, TROPONINI in the last 168 hours.  Lipid Panel: No results for input(s): CHOL, TRIG, HDL, CHOLHDL, VLDL, LDLCALC in the last 168 hours.  CBG: No results for input(s): GLUCAP in  the last 168 hours.  Microbiology: Results for orders placed or performed during the hospital encounter of 10/18/14  Surgical pcr screen     Status: None   Collection Time: 10/18/14 10:21 AM  Result Value Ref Range Status   MRSA, PCR NEGATIVE NEGATIVE Final   Staphylococcus aureus NEGATIVE NEGATIVE Final    Comment:        The Xpert SA Assay (FDA approved for NASAL specimens in patients over 56 years of age), is one component of a comprehensive surveillance program.  Test performance has been validated by [employer] for patients greater than or equal to 31 year old. It is  not intended to diagnose infection nor to guide or monitor treatment.     Coagulation Studies: No results for input(s): LABPROT, INR in the last 72 hours.  Imaging: Ct Head (brain) Wo Contrast  10/31/2014   CLINICAL DATA:  Code stroke.  Left hand numbness.  EXAM: CT HEAD WITHOUT CONTRAST  TECHNIQUE: Contiguous axial images were obtained from the base of the skull through the vertex without intravenous contrast.  COMPARISON:  None.  FINDINGS: Focal area of low attenuation within the left basal ganglia compatible with subacute to chronic lacunar infarct. There is mild patchy low attenuation within the periventricular and subcortical white matter compatible with chronic microvascular disease. The mastoid air cells and the paranasal sinuses are clear. The calvarium appears intact.  IMPRESSION: 1. No acute intracranial abnormalities. 2. Chronic small vessel ischemic disease.   Electronically Signed   By: Signa Kellaylor  Stroud M.D.   On: 10/31/2014 10:37       Assessment and plan discussed with with attending physician and they are in agreement.    Felicie MornDavid Smith PA-C [facility] [phone]  10/31/2014, 10:50 AM     Assessment: 80 y.o. female with a history of htn, hpl who presents with new onset left facea dn arm numbness and weakness.   Stroke Risk Factors - hyperlipidemia and hypertension  1.  HgbA1c, fasting lipid panel 2. MRI, MRA  of the brain without contrast 3. Frequent neuro checks 4. Echocardiogram 5. Carotid dopplers 6. Prophylactic therapy-Antiplatelet med: Aspirin - dose 325mg  PO or 300mg  PR 7. Risk factor modification 8. Telemetry monitoring 9. PT consult, OT consult, Speech consult  Ritta SlotMcNeill Juwana Thoreson, MD [facility] [phone]  If 7pm- 7am, please page neurology on call as listed in AMION.

## 2014-11-01 DIAGNOSIS — K5900 Constipation, unspecified: Secondary | ICD-10-CM

## 2014-11-01 DIAGNOSIS — I059 Rheumatic mitral valve disease, unspecified: Secondary | ICD-10-CM

## 2014-11-01 DIAGNOSIS — I639 Cerebral infarction, unspecified: Secondary | ICD-10-CM | POA: Insufficient documentation

## 2014-11-01 DIAGNOSIS — I6350 Cerebral infarction due to unspecified occlusion or stenosis of unspecified cerebral artery: Secondary | ICD-10-CM

## 2014-11-01 LAB — LIPID PANEL
CHOL/HDL RATIO: 2.8 ratio
Cholesterol: 129 mg/dL (ref 0–200)
HDL: 46 mg/dL (ref >39)
LDL Cholesterol: 62 mg/dL (ref 0–99)
TRIGLYCERIDES: 105 mg/dL (ref <150)
VLDL: 21 mg/dL (ref 0–40)

## 2014-11-01 LAB — BASIC METABOLIC PANEL
Anion gap: 13 (ref 5–15)
BUN: 10 mg/dL (ref 6–23)
CO2: 24 mEq/L (ref 19–32)
CREATININE: 0.72 mg/dL (ref 0.50–1.10)
Calcium: 8.7 mg/dL (ref 8.4–10.5)
Chloride: 99 mEq/L (ref 96–112)
GFR, EST NON AFRICAN AMERICAN: 81 mL/min — AB (ref >90)
Glucose, Bld: 101 mg/dL — ABNORMAL HIGH (ref 70–99)
Potassium: 4.1 mEq/L (ref 3.7–5.3)
Sodium: 136 mEq/L — ABNORMAL LOW (ref 137–147)

## 2014-11-01 LAB — HEMOGLOBIN A1C
HEMOGLOBIN A1C: 5.8 % — AB (ref <5.7)
MEAN PLASMA GLUCOSE: 120 mg/dL — AB (ref <117)

## 2014-11-01 LAB — TROPONIN I
TROPONIN I: 0.66 ng/mL — AB (ref <0.30)
Troponin I: 0.38 ng/mL (ref <0.30)
Troponin I: 0.31 ng/mL (ref <0.30)
Troponin I: <0.3 ng/mL (ref <0.30)

## 2014-11-01 MED ORDER — OXYMETAZOLINE HCL 0.05 % NA SOLN
1.0000 | Freq: Two times a day (BID) | NASAL | Status: DC
  Administered 2014-11-01 – 2014-11-02 (×2): 1 via NASAL
  Filled 2014-11-01: qty 15

## 2014-11-01 MED ORDER — SENNOSIDES-DOCUSATE SODIUM 8.6-50 MG PO TABS: 2.0000 | ORAL_TABLET | Freq: Every day | ORAL | Status: DC

## 2014-11-01 MED ORDER — CLOPIDOGREL BISULFATE 75 MG PO TABS
75.0000 mg | ORAL_TABLET | Freq: Every day | ORAL | Status: DC
  Administered 2014-11-02: 75 mg via ORAL
  Filled 2014-11-01: qty 1

## 2014-11-01 MED ORDER — SENNOSIDES-DOCUSATE SODIUM 8.6-50 MG PO TABS: 2.0000 | ORAL_TABLET | Freq: Every day | ORAL | Status: DC | PRN

## 2014-11-01 MED ORDER — BOOST / RESOURCE BREEZE PO LIQD
1.0000 | ORAL | Status: DC
  Administered 2014-11-01: 1 via ORAL

## 2014-11-01 MED ORDER — ASPIRIN 325 MG PO TABS: 325.0000 mg | ORAL_TABLET | Freq: Two times a day (BID) | ORAL | Status: DC

## 2014-11-01 NOTE — Progress Notes (Signed)
Orthopedic Tech Progress Note Patient Details:  [name] 07-Feb-1935cpm [id number] Went to apply CPM for patient for 3pm rounds. Paitent stated she had just been taken out of CPM. Patient said she has a similar one at home so she had her grandson apply CPM after lunch. Ortho will check back with patient at 8pm for CPM application.  Patient ID: [name], female   DOB: 23-Jan-1934, 80 y.o.   MRN: [id number]   Orie Routsia R Thompson 11/01/2014, 3:23 PM

## 2014-11-01 NOTE — Progress Notes (Addendum)
STROKE TEAM PROGRESS NOTE   HISTORY [name] is an 80 y.o. female who was awake at 0200 hours 10/31/2014 when daughter noted left facial droop and patient stated she had decreased sensation in her left hand and weakness in her left hand. EMS was called and patient was brought to ED as code stroke. On arrival she was 8 hours out and not a tPA or intervention candidate. Currently her only complaint is slight decreased sensation on the left face and left hand. Initially it was though that patient was in new onset Afib but after looking closely at rhythm strip it is NSR. NIHSS 4. Patient was not administered TPA secondary to delay in arrival. She was admitted for further evaluation and treatment.   SUBJECTIVE (INTERVAL HISTORY) Her daughter is at the bedside.  Overall she feels her condition is stable.    OBJECTIVE Temp:  [97.2 F (36.2 C)-100 F (37.8 C)] 99.1 F (37.3 C) (11/17 1050) Pulse Rate:  [83-111] 102 (11/17 1050) Cardiac Rhythm:  [-] Normal sinus rhythm;Other (Comment) (11/16 2000) Resp:  [16-25] 18 (11/17 1050) BP: (122-154)/(72-89) 154/88 mmHg (11/17 1050) SpO2:  [95 %-100 %] 99 % (11/17 1050)   Recent Labs Lab 10/31/14 1102  GLUCAP 90    Recent Labs Lab 10/27/14 0500 10/28/14 0409 10/31/14 1038 10/31/14 1046 11/01/14 0743  NA 141 137 135* 135* 136*  K 3.3* 3.2* 3.0* 2.8* 4.1  CL 101 97 96 96 99  CO2 23 22 25   --  24  GLUCOSE 106* 112* 105* 105* 101*  BUN 11 13 16 14 10   CREATININE 0.90 0.85 0.79 0.80 0.72  CALCIUM 8.0* 8.3* 8.8  --  8.7    Recent Labs Lab 10/31/14 1038  AST 40*  ALT 15  ALKPHOS 83  BILITOT 0.7  PROT 6.4  ALBUMIN 2.7*    Recent Labs Lab 10/26/14 1119 10/27/14 0500 10/28/14 0409 10/31/14 1038 10/31/14 1046  WBC  --  7.3 11.4* 5.8  --   NEUTROABS  --   --   --  3.9  --   HGB 13.3 9.2* 8.6* 8.2* 8.8*  HCT 39.0 28.0* 26.0* 25.3* 26.0*  MCV  --  80.0 79.8 81.4  --   PLT  --  169 146* 231  --     Recent Labs Lab  10/31/14 1526 10/31/14 2143 11/01/14 0129 11/01/14 0743  TROPONINI 0.44* 0.69* 0.66* 0.38*    Recent Labs  10/31/14 1038  LABPROT 15.3*  INR 1.19   No results for input(s): COLORURINE, LABSPEC, PHURINE, GLUCOSEU, HGBUR, BILIRUBINUR, KETONESUR, PROTEINUR, UROBILINOGEN, NITRITE, LEUKOCYTESUR in the last 72 hours.  Invalid input(s): APPERANCEUR     Component Value Date/Time   CHOL 129 11/01/2014 0129   TRIG 105 11/01/2014 0129   HDL 46 11/01/2014 0129   CHOLHDL 2.8 11/01/2014 0129   VLDL 21 11/01/2014 0129   LDLCALC 62 11/01/2014 0129   Lab Results  Component Value Date   HGBA1C  07/03/2009    5.2 (NOTE) The ADA recommends the following therapeutic goal for glycemic control related to Hgb A1c measurement: Goal of therapy: <6.5 Hgb A1c  Reference: American Diabetes Association: Clinical Practice Recommendations 2010, Diabetes Care, 2010, 33: (Suppl  1).   No results found for: LABOPIA, COCAINSCRNUR, LABBENZ, AMPHETMU, THCU, LABBARB  No results for input(s): ETH in the last 168 hours.  Ct Head (brain) Wo Contrast  10/31/2014   CLINICAL DATA:  Code stroke.  Left hand numbness.  EXAM: CT HEAD WITHOUT CONTRAST  TECHNIQUE: Contiguous axial images were obtained from the base of the skull through the vertex without intravenous contrast.  COMPARISON:  None.  FINDINGS: Focal area of low attenuation within the left basal ganglia compatible with subacute to chronic lacunar infarct. There is mild patchy low attenuation within the periventricular and subcortical white matter compatible with chronic microvascular disease. The mastoid air cells and the paranasal sinuses are clear. The calvarium appears intact.  IMPRESSION: 1. No acute intracranial abnormalities. 2. Chronic small vessel ischemic disease.   Electronically Signed   By: Signa Kell M.D.   On: 10/31/2014 10:37   Mr Brain Wo Contrast  10/31/2014   CLINICAL DATA:  Initial evaluation for acute onset left facial droop, decreased  sensation and left hand  EXAM: MRI HEAD WITHOUT CONTRAST  MRA HEAD WITHOUT CONTRAST  TECHNIQUE: Multiplanar, multiecho pulse sequences of the brain and surrounding structures were obtained without intravenous contrast. Angiographic images of the head were obtained using MRA technique without contrast.  COMPARISON:  Prior noncontrast head CT from earlier the same day.  FINDINGS: MRI HEAD FINDINGS  There is a subtle curvilinear focus of restricted diffusion within the cortex of the posterior or right frontal lobe (series 4, image 22), of compatible with acute ischemic infarct. No significant mass effect or associated hemorrhage. No other acute ischemic infarct identified. Gray-white matter differentiation otherwise maintained. Normal flow voids seen within the intracranial vasculature.  Mild diffuse prominence of the CSF containing spaces is compatible with generalized age-related cerebral atrophy. Patchy T2/FLAIR hyperintensities within the periventricular deep white matter both cerebral hemispheres is present, nonspecific, but likely related to moderate chronic small vessel ischemic changes. Subcentimeter hypo intense focus within the posterior left frontal lobe on gradient echo sequence likely reflects a small chronic microhemorrhage. Probable additional small microhemorrhage present within the right thalamus.  No mass lesion or midline shift. Ventricles are normal in size without evidence of hydrocephalus. No extra-axial fluid collection.  Pituitary gland within normal limits. Craniocervical junction unremarkable. No acute abnormality seen about the orbits.  Mild mucosal thickening present within the inferior left maxillary sinus. Paranasal sinuses are otherwise clear. No mastoid effusion.  Bone marrow signal intensity within normal limits. Visualized upper cervical spine unremarkable.  MRA HEAD FINDINGS  ANTERIOR CIRCULATION:  Visualized distal cervical segments of the internal carotid arteries are widely patent  with antegrade flow. The petrous, cavernous, and clinoid segments are widely patent without hemodynamically significant stenosis. Mild irregularity within the cavernous right ICA likely related to underlying atheromatous plaque. A1 segments, anterior communicating artery, and anterior cerebral arteries are within normal limits.  M1 segments are widely patent bilaterally without proximal branch occlusion or hemodynamically significant stenosis. Distal MCA branches well opacified bilaterally.  POSTERIOR CIRCULATION:  Vertebral arteries are codominant and widely patent bilaterally. Partially visualized posterior inferior cerebral arteries patent. Vertebrobasilar junction is within normal limits. The basilar artery itself is mildly tortuous but widely patent. Posterior cerebral arteries and superior cerebral arteries are patent bilaterally.  No convincing evidence for aneurysm or vascular malformation.  IMPRESSION: MRI HEAD IMPRESSION:  1. Small subtle cortical ischemic infarct involving the posterior right frontal lobe as above. No significant mass effect or associated hemorrhage. 2. Atrophy with moderate chronic small vessel ischemic disease.  MRA HEAD IMPRESSION:  No proximal branch occlusion or hemodynamically significant stenosis identified within the intracranial circulation.   Electronically Signed   By: Rise Mu M.D.   On: 10/31/2014 22:23   Mr Maxine Glenn Head/brain Wo Cm  10/31/2014   CLINICAL  DATA:  Initial evaluation for acute onset left facial droop, decreased sensation and left hand  EXAM: MRI HEAD WITHOUT CONTRAST  MRA HEAD WITHOUT CONTRAST  TECHNIQUE: Multiplanar, multiecho pulse sequences of the brain and surrounding structures were obtained without intravenous contrast. Angiographic images of the head were obtained using MRA technique without contrast.  COMPARISON:  Prior noncontrast head CT from earlier the same day.  FINDINGS: MRI HEAD FINDINGS  There is a subtle curvilinear focus of  restricted diffusion within the cortex of the posterior or right frontal lobe (series 4, image 22), of compatible with acute ischemic infarct. No significant mass effect or associated hemorrhage. No other acute ischemic infarct identified. Gray-white matter differentiation otherwise maintained. Normal flow voids seen within the intracranial vasculature.  Mild diffuse prominence of the CSF containing spaces is compatible with generalized age-related cerebral atrophy. Patchy T2/FLAIR hyperintensities within the periventricular deep white matter both cerebral hemispheres is present, nonspecific, but likely related to moderate chronic small vessel ischemic changes. Subcentimeter hypo intense focus within the posterior left frontal lobe on gradient echo sequence likely reflects a small chronic microhemorrhage. Probable additional small microhemorrhage present within the right thalamus.  No mass lesion or midline shift. Ventricles are normal in size without evidence of hydrocephalus. No extra-axial fluid collection.  Pituitary gland within normal limits. Craniocervical junction unremarkable. No acute abnormality seen about the orbits.  Mild mucosal thickening present within the inferior left maxillary sinus. Paranasal sinuses are otherwise clear. No mastoid effusion.  Bone marrow signal intensity within normal limits. Visualized upper cervical spine unremarkable.  MRA HEAD FINDINGS  ANTERIOR CIRCULATION:  Visualized distal cervical segments of the internal carotid arteries are widely patent with antegrade flow. The petrous, cavernous, and clinoid segments are widely patent without hemodynamically significant stenosis. Mild irregularity within the cavernous right ICA likely related to underlying atheromatous plaque. A1 segments, anterior communicating artery, and anterior cerebral arteries are within normal limits.  M1 segments are widely patent bilaterally without proximal branch occlusion or hemodynamically significant  stenosis. Distal MCA branches well opacified bilaterally.  POSTERIOR CIRCULATION:  Vertebral arteries are codominant and widely patent bilaterally. Partially visualized posterior inferior cerebral arteries patent. Vertebrobasilar junction is within normal limits. The basilar artery itself is mildly tortuous but widely patent. Posterior cerebral arteries and superior cerebral arteries are patent bilaterally.  No convincing evidence for aneurysm or vascular malformation.  IMPRESSION: MRI HEAD IMPRESSION:  1. Small subtle cortical ischemic infarct involving the posterior right frontal lobe as above. No significant mass effect or associated hemorrhage. 2. Atrophy with moderate chronic small vessel ischemic disease.  MRA HEAD IMPRESSION:  No proximal branch occlusion or hemodynamically significant stenosis identified within the intracranial circulation.   Electronically Signed   By: Rise Mu M.D.   On: 10/31/2014 22:23   Carotid Doppler  There is 1-39% bilateral ICA stenosis. Vertebral artery flow is antegrade.    PHYSICAL EXAM Pleasant elderly african american lady not in distress.Awake alert. Afebrile. Head is nontraumatic. Neck is supple without bruit. Hearing is normal. Cardiac exam no murmur or gallop. Lungs are clear to auscultation. Distal pulses are well felt. Neurological Exam : Awake alert oriented x 3 normal speech and language. Mild left lower face asymmetry. Tongue midline. No drift. Mild diminished fine finger movements on left. Orbits right over left upper extremity. Mild left grip weak.. Normal sensation except hyperesthesia left hand finger tips . Normal coordination. ASSESSMENT/PLAN Ms. ETHELEAN COLLA is a 80 y.o. female with history of hypertension, hyperlipidemia, s/p mitral valve  repair in 2010, s/p bilateral knee replacements (left on 10/26/2014) and peptic ulcer disease presenting with decreased sensation and weakness in her L hand. She did not receive IV t-PA due to delay in  arrival.   Stroke:  Non-dominant right posterior frontal lobe infarct secondary to small vessel disease source  Resultant  Left arm hemisensory deficit  MRI  Small right poster frontal lobe infarct  MRA  No significant stenosis   Carotid Doppler  No significant stenosis   2D Echo  pending   HgbA1c pending  Heparin 5000 units sq tid for VTE prophylaxis  Diet Heart thin liquids  aspirin 325 mg orally every day prior to admission, now on aspirin 325 mg orally every day. Recommend change to plavix 75 mg daily given stroke while on aspirin. Order written.  Patient counseled to be compliant with her antithrombotic medications  Ongoing aggressive risk factor management  Therapy recommendations:  pending   Disposition:  Anticipate return home  Hypertension  Stable  Hyperlipidemia  Home meds:  Zocor 20 mg daily resumed in hospital  LDL 62, at goal < 70  Continue statin at discharge  Other Stroke Risk Factors  Advanced age  Former Cigarette smoker  Hospital day # 1  Janet MainSHARON BIBY, [name], [name], [name], [name] [facility] Pager: [phone] 11/01/2014 10:56 AM   I have personally examined this patient, reviewed notes, independently viewed imaging studies, participated in medical decision making and plan of care. I have made any additions or clarifications directly to the above note. Agree with note above. She has small lacunar infarct secondary to small vessel disease. Needs therapy eval and to decide on disposition. Discussed with daughter and patient and answered questions  Delia HeadyPramod Vern Prestia, MD Medical Director [facility] Pager: [phone] 11/01/2014 8:09 PM   To contact Stroke Continuity provider, please refer to [email or web address]. After hours, contact General Neurology

## 2014-11-01 NOTE — Progress Notes (Signed)
[name] is 6 days status post left total knee replacement.  She was discharged home on postop day #2 and was doing well.  She came back to the hospital with some left upper extremity symptoms with concerns for CVA.  She had an MRI of the brain which showed a small right-sided infarct.  She is without complaints related to her left knee. Physical exam: Left knee exam: Her wound is benign.  Calf is soft and nontender.  Mild to moderate lower extremity swelling. N-V intact distally.range of motion is 0 to 85 of flexion.  Assessment: 6 days status post  Left total knee replacement,, overall doing well.  Plan: Okay to anticoagulate per medicine service.  We normally use aspirin 325 mg twice daily. Physical therapy has been ordered.  She may weight-bear as tolerated CPM machine.  She will need followup with [name] in 10 days.please call if needed.  Office number [phone].

## 2014-11-01 NOTE — Progress Notes (Signed)
  Echocardiogram 2D Echocardiogram has been performed.  [name] 11/01/2014, 10:51 AM

## 2014-11-01 NOTE — Progress Notes (Signed)
Internal Medicine Attending  Date: 11/01/2014  Patient name: [name] Medical record number: [id number] Date of birth: 1934/12/11 Age: 80 y.o. Gender: female  I saw and evaluated the patient this afternoon.  I discussed her care with resident and I reviewed the resident's note by Dr. Senaida Oresichardson.  I agree with the resident's findings and plans as documented in her note, with the following additional comments.  TEE is planned to evaluate findings on 2-D echocardiogram of thickening of the anterior mitral valve leaflet suggestive of either a myxomatous mitral valve or a vegetation.  Patient had a mild troponin elevation which is likely due to her stroke, given absence of any anginal symptoms; will continue to follow, and would discuss with cardiology.  Neurology has recommended changing from aspirin to Plavix for stroke prophylaxis; we will need to consider the options for DVT prophylaxis since she is recently status post left total knee replacement.

## 2014-11-01 NOTE — Progress Notes (Signed)
Bilateral carotid artery duplex completed:  1-39% ICA stenosis.  Vertebral artery flow is antegrade.     

## 2014-11-01 NOTE — Progress Notes (Signed)
CRITICAL VALUE ALERT  Critical value received:  Triponin  Date of notification:  11/01/14   Time of notification:  0221  Critical value read back:Yes.    Nurse who received alert:  Abe PeopleStephanie Torri Michalski   MD notified (1st page):  Mallory  Time of first page:  0221   MD notified (2nd page):  Time of second page:  Responding MD:  Leatha GildingMallory  Time MD responded:  [phone]

## 2014-11-01 NOTE — Progress Notes (Deleted)
[name] is 6 days status post left total knee  Replacement.  She is well known to us.  She was admitted back to the hospital yesterday with left upper extremity symptoms.  MR I of the brain showed a very small right-sided infarct.she is without significant knee complai

## 2014-11-01 NOTE — Evaluation (Signed)
Speech Language Pathology Evaluation Patient Details Name: [name] MRN: [id number] DOB: 1934/03/21 Today's Date: 11/01/2014 Time: [id number] SLP Time Calculation (min) (ACUTE ONLY): 23 min  Problem List:  Patient Active Problem List   Diagnosis Date Noted  . Cerebral infarction due to unspecified mechanism   . CVA (cerebral infarction) 10/31/2014  . Acute ischemic stroke 10/31/2014  . Elevated troponin   . Absolute anemia   . Hypokalemia 10/27/2014  . Postoperative anemia due to acute blood loss 10/27/2014  . Primary osteoarthritis of left knee 10/26/2014  . Status post total right knee replacement 10/26/2014  . Osteoarthritis of right knee 06/03/2014   Past Medical History:  Past Medical History  Diagnosis Date  . Family history of anesthesia complication     Daughter; takes a long time to wake up  . Hypertension   . Shortness of breath     while walking  . Frequency of urination     at time  . GERD (gastroesophageal reflux disease)   . Arthritis   . Hyperlipemia   . Atrial fibrillation 2010    Pt had mitral valve replacement in 2010 that put pt back into NSR.   Past Surgical History:  Past Surgical History  Procedure Laterality Date  . Mitral valve repair    . Abdominal hysterectomy    . Eye surgery Bilateral     cataract removed; lens implant  . Total knee arthroplasty Right 06/03/2014    Procedure: RIGHT TOTAL KNEE ARTHROPLASTY ;  Surgeon: Harvie JuniorJohn L Graves, MD;  Location: [facility] OR;  Service: Orthopedics;  Laterality: Right;  . Total knee arthroplasty Left 10/26/2014    Procedure: LEFT TOTAL KNEE ARTHROPLASTY;  Surgeon: Harvie JuniorJohn L Graves, MD;  Location: [facility] OR;  Service: Orthopedics;  Laterality: Left;  . Cholecystectomy     HPI:  Patient is a 80 year old woman with history of hypertension, hyperlipidemia, peptic ulcer disease, status post mitral valve repair by Dr. Cornelius Moraswen 06/2009 for mitral valve prolapse with severe mitral regurgitation, status post bilateral total knee  replacement, most recently on the left by Dr. Luiz BlareGraves 10/26/2014, and other problems as outlined in the medical history, admitted with complaint of left hand weakness and numbness and left facial numbness/droop which she noticed around 2 AM on 11/16;  She denies headache, chest pain, shortness of breath, or abdominal pain.   Assessment / Plan / Recommendation Clinical Impression  Orders received; cognitive-linguistic evaluation complete. Patient's expressive and receptive language, as well as cognitive function appears [facility] for tasks assessed. Patient speech is also intelligible at the conversational level. As a result, skilled SLP intervention is not warranted at this time. SLP discussed recommendations and patient in agreeance.     SLP Assessment  Patient does not need any further Speech Lanaguage Pathology Services    Follow Up Recommendations  None    Frequency and Duration      Pertinent Vitals/Pain Pain Assessment: No/denies pain   SLP Goals  Patient/Family Stated Goal: none stated  SLP Evaluation Prior Functioning  Cognitive/Linguistic Baseline: Within functional limits Type of Home: House  Lives With: Daughter Available Help at Discharge: Family;Available 24 hours/day   Cognition  Overall Cognitive Status: Within Functional Limits for tasks assessed    Comprehension  Auditory Comprehension Overall Auditory Comprehension: Appears within functional limits for tasks assessed Visual Recognition/Discrimination Discrimination: Within Function Limits Reading Comprehension Reading Status: Within funtional limits    Expression Expression Primary Mode of Expression: Verbal Verbal Expression Overall Verbal Expression: Appears within functional limits for  tasks assessed Initiation: No impairment Automatic Speech: Name;Social Response Written Expression Dominant Hand: Right Written Expression: Within Functional Limits   Oral / Motor Oral Motor/Sensory Function Overall Oral  Motor/Sensory Function: Appears within functional limits for tasks assessed Motor Speech Overall Motor Speech: Appears within functional limits for tasks assessed   GO     [name] 11/01/2014, 2:11 PM

## 2014-11-01 NOTE — Evaluation (Signed)
Physical Therapy Evaluation Patient Details Name: Janet GrumblesSusan A [name] MRN: [id number] DOB: [birth year] Today's Date: 11/01/2014   History of Present Illness  80 y.o. female admitted 10/31/14 with speech difficulty. + Rt corona radiata on MRI.  Marland Kitchen. Pt with significant PMHx 10/26/14 L TKA, R TKA, HTN, and mitral valve repair.  Clinical Impression  Pt admitted with Rt CVA with primarily Lt sided decr sensation and LUE weakness. Pt also s/p recent Lt TKR (10/26/14). Pt currently with functional limitations due to the deficits listed below (see PT Problem List).  Pt will benefit from skilled PT to increase their independence and safety with mobility to allow discharge to the venue listed below.       Follow Up Recommendations Home health PT;Supervision for mobility/OOB    Equipment Recommendations  None recommended by PT    Recommendations for Other Services       Precautions / Restrictions Precautions Precautions: Fall;Knee Precaution Comments: reinforced positioning for knee extension and decr edema Restrictions LLE Weight Bearing: Weight bearing as tolerated      Mobility  Bed Mobility Overal bed mobility: Modified Independent Bed Mobility: Supine to Sit     Supine to sit: Modified independent (Device/Increase time) (HOB 0, no rail)        Transfers Overall transfer level: Needs assistance Equipment used: Rolling walker (2 wheeled) Transfers: Sit to/from Stand Sit to Stand: Min guard         General transfer comment: x 2; vc each time for safe use of RW/hand placement; required assist to pull up her underwear due to weakness Lt hand  Ambulation/Gait Ambulation/Gait assistance: Min guard;Supervision Ambulation Distance (Feet): 15 Feet (to bathroom, 20 ft) Assistive device: Rolling walker (2 wheeled) Gait Pattern/deviations: Step-to pattern;Decreased stride length;Decreased weight shift to left;Decreased stance time - left Gait velocity: decreased   General Gait Details:  Gait limited by recent Lt TKR and per pt and daughter appears no different than PTA  Stairs            Wheelchair Mobility    Modified Rankin (Stroke Patients Only) Modified Rankin (Stroke Patients Only) Pre-Morbid Rankin Score: No symptoms Modified Rankin: Moderately severe disability (assist with ADLs due to LUE weakness)     Balance Overall balance assessment: No apparent balance deficits (not formally assessed) Sitting-balance support: No upper extremity supported;Feet supported Sitting balance-Leahy Scale: Good     Standing balance support: No upper extremity supported;During functional activity Standing balance-Leahy Scale: Fair                               Pertinent Vitals/Pain Pain Assessment: 0-10 Pain Score: 7  Pain Location: Lt knee with ROM Pain Intervention(s): Limited activity within patient's tolerance;Monitored during session;Repositioned;Patient requesting pain meds-RN notified;RN gave pain meds during session    Home Living Family/patient expects to be discharged to:: Private residence Living Arrangements: Children Available Help at Discharge: Family;Available 24 hours/day Type of Home: House Home Access: Stairs to enter Entrance Stairs-Rails: Right Entrance Stairs-Number of Steps: 4 Home Layout: One level Home Equipment: Walker - 2 wheels;Cane - single point;Bedside commode;Shower seat;Wheelchair - manual      Prior Function Level of Independence: Needs assistance   Gait / Transfers Assistance Needed: modified ind with RW  ADL's / Homemaking Assistance Needed: assist with sponge bath        Hand Dominance   Dominant Hand: Right    Extremity/Trunk Assessment   Upper Extremity Assessment: Defer to  OT evaluation (able to grip and support herself via RW)           Lower Extremity Assessment: LLE deficits/detail   LLE Deficits / Details: AAROM knee flexion 0-70; +edema (especially ankle); strength ankle DF  5/5  Cervical / Trunk Assessment: Normal  Communication   Communication: No difficulties  Cognition Arousal/Alertness: Awake/alert Behavior During Therapy: WFL for tasks assessed/performed Overall Cognitive Status: Within Functional Limits for tasks assessed                      General Comments General comments (skin integrity, edema, etc.): Daughter present throughout.     Exercises Total Joint Exercises Ankle Circles/Pumps: AROM;Left;10 reps;Supine Hip ABduction/ADduction: AROM;Both;10 reps Knee Flexion: AAROM;Left;10 reps;Supine (assisted heel slide; resisted extension)      Assessment/Plan    PT Assessment Patient needs continued PT services  PT Diagnosis Difficulty walking   PT Problem List Decreased strength;Decreased range of motion;Decreased activity tolerance;Decreased mobility;Decreased knowledge of use of DME;Impaired sensation;Pain  PT Treatment Interventions DME instruction;Gait training;Stair training;Functional mobility training;Therapeutic activities;Therapeutic exercise;Neuromuscular re-education;Patient/family education   PT Goals (Current goals can be found in the Care Plan section) Acute Rehab PT Goals Patient Stated Goal: to go home at discharge PT Goal Formulation: With patient/family Time For Goal Achievement: 11/08/14 Potential to Achieve Goals: Good    Frequency Min 4X/week   Barriers to discharge        Co-evaluation               End of Session Equipment Utilized During Treatment: Gait belt Activity Tolerance: Patient tolerated treatment well Patient left: in chair;with call bell/phone within reach;with chair alarm set;with family/visitor present Nurse Communication: Mobility status;Patient requests pain meds         Time: [id number] PT Time Calculation (min) (ACUTE ONLY): 36 min   Charges:   PT Evaluation $Initial PT Evaluation Tier I: 1 Procedure PT Treatments $Gait Training: 8-22 mins $Therapeutic Exercise: 8-22  mins   PT G Codes:          Beola Vasallo 11/01/2014, 12:31 PM  Pager [phone]

## 2014-11-01 NOTE — Progress Notes (Signed)
Subjective:  Patient was seen and examined this afternoon. Patient states her left hand weakness is slightly improved, but still present. Patient continues to have decreased sensation in left 2, 3, 4th fingers. Patient denies any other complaints. She worked well with physical therapy.   Objective: Vital signs in last 24 hours: Filed Vitals:   10/31/14 2200 11/01/14 0000 11/01/14 0200 11/01/14 0626  BP: 144/72 131/89 133/79 153/85  Pulse: 103 102 108 105  Temp: 98.8 F (37.1 C) 98.9 F (37.2 C) 98.6 F (37 C) 100 F (37.8 C)  TempSrc: Oral Oral Oral Oral  Resp: 16 16  18   Height:      Weight:      SpO2: 100% 98% 98% 100%   Weight change:   Intake/Output Summary (Last 24 hours) at 11/01/14 1045 Last data filed at 11/01/14 0835  Gross per 24 hour  Intake    120 ml  Output      0 ml  Net    120 ml   General: Vital signs reviewed. Patient is well-developed and well-nourished, in no acute distress and cooperative with exam.  Cardiovascular: Regular rate, irregular rhythm, S1 normal, S2 normal, no murmurs, gallops, or rubs. Pulmonary/Chest: Clear to auscultation bilaterally, no wheezes, rales, or rhonchi. Abdominal: Soft, non-tender, non-distended, BS +, no masses, organomegaly, or guarding present.  Musculoskeletal: R knee scar, well healed. L knee well bandaged.  Extremities: No lower extremity edema bilaterally, pulses symmetric and intact bilaterally. No cyanosis or clubbing. Neurological: A&O x3, 4/5 Strength in left hand, RUE, and RLE is normal. Decreased sensation in left hand 2, 3, 4 fingers. Strength in LLE is difficult to assess 2/2 to pain from recent surgery, cranial nerve II-XII are grossly intact, no focal motor deficit, sensory intact to light touch bilaterally.  Skin: Warm, dry and intact. No rashes or erythema. Psychiatric: Normal mood and affect. speech and behavior is normal. Cognition and memory are normal.   Lab Results: Basic Metabolic Panel:  Recent  Labs Lab 10/31/14 1038 10/31/14 1046 11/01/14 0743  NA 135* 135* 136*  K 3.0* 2.8* 4.1  CL 96 96 99  CO2 25  --  24  GLUCOSE 105* 105* 101*  BUN 16 14 10   CREATININE 0.79 0.80 0.72  CALCIUM 8.8  --  8.7   Liver Function Tests:  Recent Labs Lab 10/31/14 1038  AST 40*  ALT 15  ALKPHOS 83  BILITOT 0.7  PROT 6.4  ALBUMIN 2.7*   CBC:  Recent Labs Lab 10/28/14 0409 10/31/14 1038 10/31/14 1046  WBC 11.4* 5.8  --   NEUTROABS  --  3.9  --   HGB 8.6* 8.2* 8.8*  HCT 26.0* 25.3* 26.0*  MCV 79.8 81.4  --   PLT 146* 231  --    Cardiac Enzymes:  Recent Labs Lab 10/31/14 2143 11/01/14 0129 11/01/14 0743  TROPONINI 0.69* 0.66* 0.38*   CBG:  Recent Labs Lab 10/31/14 1102  GLUCAP 90   Fasting Lipid Panel:  Recent Labs Lab 11/01/14 0129  CHOL 129  HDL 46  LDLCALC 62  TRIG 105  CHOLHDL 2.8   Coagulation:  Recent Labs Lab 10/31/14 1038  LABPROT 15.3*  INR 1.19   Studies/Results: Ct Head (brain) Wo Contrast  10/31/2014   CLINICAL DATA:  Code stroke.  Left hand numbness.  EXAM: CT HEAD WITHOUT CONTRAST  TECHNIQUE: Contiguous axial images were obtained from the base of the skull through the vertex without intravenous contrast.  COMPARISON:  None.  FINDINGS: Focal area of low attenuation within the left basal ganglia compatible with subacute to chronic lacunar infarct. There is mild patchy low attenuation within the periventricular and subcortical white matter compatible with chronic microvascular disease. The mastoid air cells and the paranasal sinuses are clear. The calvarium appears intact.  IMPRESSION: 1. No acute intracranial abnormalities. 2. Chronic small vessel ischemic disease.   Electronically Signed   By: Signa Kellaylor  Stroud M.D.   On: 10/31/2014 10:37   Mr Brain Wo Contrast  10/31/2014   CLINICAL DATA:  Initial evaluation for acute onset left facial droop, decreased sensation and left hand  EXAM: MRI HEAD WITHOUT CONTRAST  MRA HEAD WITHOUT CONTRAST   TECHNIQUE: Multiplanar, multiecho pulse sequences of the brain and surrounding structures were obtained without intravenous contrast. Angiographic images of the head were obtained using MRA technique without contrast.  COMPARISON:  Prior noncontrast head CT from earlier the same day.  FINDINGS: MRI HEAD FINDINGS  There is a subtle curvilinear focus of restricted diffusion within the cortex of the posterior or right frontal lobe (series 4, image 22), of compatible with acute ischemic infarct. No significant mass effect or associated hemorrhage. No other acute ischemic infarct identified. Gray-white matter differentiation otherwise maintained. Normal flow voids seen within the intracranial vasculature.  Mild diffuse prominence of the CSF containing spaces is compatible with generalized age-related cerebral atrophy. Patchy T2/FLAIR hyperintensities within the periventricular deep white matter both cerebral hemispheres is present, nonspecific, but likely related to moderate chronic small vessel ischemic changes. Subcentimeter hypo intense focus within the posterior left frontal lobe on gradient echo sequence likely reflects a small chronic microhemorrhage. Probable additional small microhemorrhage present within the right thalamus.  No mass lesion or midline shift. Ventricles are normal in size without evidence of hydrocephalus. No extra-axial fluid collection.  Pituitary gland within normal limits. Craniocervical junction unremarkable. No acute abnormality seen about the orbits.  Mild mucosal thickening present within the inferior left maxillary sinus. Paranasal sinuses are otherwise clear. No mastoid effusion.  Bone marrow signal intensity within normal limits. Visualized upper cervical spine unremarkable.  MRA HEAD FINDINGS  ANTERIOR CIRCULATION:  Visualized distal cervical segments of the internal carotid arteries are widely patent with antegrade flow. The petrous, cavernous, and clinoid segments are widely patent  without hemodynamically significant stenosis. Mild irregularity within the cavernous right ICA likely related to underlying atheromatous plaque. A1 segments, anterior communicating artery, and anterior cerebral arteries are within normal limits.  M1 segments are widely patent bilaterally without proximal branch occlusion or hemodynamically significant stenosis. Distal MCA branches well opacified bilaterally.  POSTERIOR CIRCULATION:  Vertebral arteries are codominant and widely patent bilaterally. Partially visualized posterior inferior cerebral arteries patent. Vertebrobasilar junction is within normal limits. The basilar artery itself is mildly tortuous but widely patent. Posterior cerebral arteries and superior cerebral arteries are patent bilaterally.  No convincing evidence for aneurysm or vascular malformation.  IMPRESSION: MRI HEAD IMPRESSION:  1. Small subtle cortical ischemic infarct involving the posterior right frontal lobe as above. No significant mass effect or associated hemorrhage. 2. Atrophy with moderate chronic small vessel ischemic disease.  MRA HEAD IMPRESSION:  No proximal branch occlusion or hemodynamically significant stenosis identified within the intracranial circulation.   Electronically Signed   By: Rise MuBenjamin  McClintock M.D.   On: 10/31/2014 22:23   Mr Maxine GlennMra Head/brain Wo Cm  10/31/2014   CLINICAL DATA:  Initial evaluation for acute onset left facial droop, decreased sensation and left hand  EXAM: MRI HEAD WITHOUT CONTRAST  MRA  HEAD WITHOUT CONTRAST  TECHNIQUE: Multiplanar, multiecho pulse sequences of the brain and surrounding structures were obtained without intravenous contrast. Angiographic images of the head were obtained using MRA technique without contrast.  COMPARISON:  Prior noncontrast head CT from earlier the same day.  FINDINGS: MRI HEAD FINDINGS  There is a subtle curvilinear focus of restricted diffusion within the cortex of the posterior or right frontal lobe (series 4,  image 22), of compatible with acute ischemic infarct. No significant mass effect or associated hemorrhage. No other acute ischemic infarct identified. Gray-white matter differentiation otherwise maintained. Normal flow voids seen within the intracranial vasculature.  Mild diffuse prominence of the CSF containing spaces is compatible with generalized age-related cerebral atrophy. Patchy T2/FLAIR hyperintensities within the periventricular deep white matter both cerebral hemispheres is present, nonspecific, but likely related to moderate chronic small vessel ischemic changes. Subcentimeter hypo intense focus within the posterior left frontal lobe on gradient echo sequence likely reflects a small chronic microhemorrhage. Probable additional small microhemorrhage present within the right thalamus.  No mass lesion or midline shift. Ventricles are normal in size without evidence of hydrocephalus. No extra-axial fluid collection.  Pituitary gland within normal limits. Craniocervical junction unremarkable. No acute abnormality seen about the orbits.  Mild mucosal thickening present within the inferior left maxillary sinus. Paranasal sinuses are otherwise clear. No mastoid effusion.  Bone marrow signal intensity within normal limits. Visualized upper cervical spine unremarkable.  MRA HEAD FINDINGS  ANTERIOR CIRCULATION:  Visualized distal cervical segments of the internal carotid arteries are widely patent with antegrade flow. The petrous, cavernous, and clinoid segments are widely patent without hemodynamically significant stenosis. Mild irregularity within the cavernous right ICA likely related to underlying atheromatous plaque. A1 segments, anterior communicating artery, and anterior cerebral arteries are within normal limits.  M1 segments are widely patent bilaterally without proximal branch occlusion or hemodynamically significant stenosis. Distal MCA branches well opacified bilaterally.  POSTERIOR CIRCULATION:   Vertebral arteries are codominant and widely patent bilaterally. Partially visualized posterior inferior cerebral arteries patent. Vertebrobasilar junction is within normal limits. The basilar artery itself is mildly tortuous but widely patent. Posterior cerebral arteries and superior cerebral arteries are patent bilaterally.  No convincing evidence for aneurysm or vascular malformation.  IMPRESSION: MRI HEAD IMPRESSION:  1. Small subtle cortical ischemic infarct involving the posterior right frontal lobe as above. No significant mass effect or associated hemorrhage. 2. Atrophy with moderate chronic small vessel ischemic disease.  MRA HEAD IMPRESSION:  No proximal branch occlusion or hemodynamically significant stenosis identified within the intracranial circulation.   Electronically Signed   By: Rise MuBenjamin  McClintock M.D.   On: 10/31/2014 22:23   Medications:  I have reviewed the patient's current medications. Prior to Admission:  Prescriptions prior to admission  Medication Sig Dispense Refill Last Dose  . acetaminophen (TYLENOL) 650 MG CR tablet Take 650 mg by mouth daily as needed for pain.    10/30/2014 at Unknown time  . Ascorbic Acid (VITAMIN C PO) Take 1 tablet by mouth daily.   10/30/2014 at Unknown time  . aspirin EC 325 MG tablet Take 1 tablet (325 mg total) by mouth 2 (two) times daily after a meal. 60 tablet 0 10/30/2014 at Unknown time  . dicyclomine (BENTYL) 10 MG capsule Take 10 mg by mouth daily before lunch.   10/30/2014 at Unknown time  . diltiazem (DILACOR XR) 240 MG 24 hr capsule Take 240 mg by mouth daily.   10/30/2014 at Unknown time  . HYDROcodone-acetaminophen (NORCO) 5-325 MG  per tablet Take 1-2 tablets by mouth every 6 (six) hours as needed for moderate pain. 40 tablet 0 10/31/2014 at Unknown time  . lisinopril-hydrochlorothiazide (PRINZIDE,ZESTORETIC) 20-12.5 MG per tablet Take 1 tablet by mouth daily.   10/30/2014 at Unknown time  . methocarbamol (ROBAXIN) 500 MG tablet Take  1 tablet (500 mg total) by mouth every 8 (eight) hours as needed for muscle spasms. 40 tablet 0 10/30/2014 at Unknown time  . metoprolol tartrate (LOPRESSOR) 25 MG tablet Take 25 mg by mouth daily.   10/30/2014 at 1100  . omeprazole (PRILOSEC) 20 MG capsule Take 20 mg by mouth daily.   10/30/2014 at Unknown time  . Oxymetazoline HCl (AFRIN NASAL SPRAY NA) Place 2-3 sprays into both nostrils daily as needed (congestion).    10/30/2014 at Unknown time  . Polyethyl Glycol-Propyl Glycol (SYSTANE OP) Place 2 drops into both eyes daily.    10/30/2014 at Unknown time  . potassium chloride (K-DUR) 10 MEQ tablet Take 1 tablet by mouth daily.  0 10/30/2014 at Unknown time  . simvastatin (ZOCOR) 20 MG tablet Take 20 mg by mouth daily.   10/30/2014 at Unknown time   Scheduled Meds: . aspirin  300 mg Rectal Daily   Or  . aspirin  325 mg Oral Daily  . heparin  5,000 Units Subcutaneous 3 times per day  . pantoprazole  40 mg Oral Daily  . simvastatin  20 mg Oral q1800   Continuous Infusions:  PRN Meds:.HYDROcodone-acetaminophen Assessment/Plan: Active Problems:   CVA (cerebral infarction)   Acute ischemic stroke   Elevated troponin   Absolute anemia  Possible Acute CVA: Patient presented with new onset left hand weakness and numbness. CT head showed no acute intracranial abnormalities and chronic small vessel ischemic disease. Patient was out of the tPA window due to time and due to recent left knee total replacement. MRI/MRA showed mall subtle cortical ischemic infarct involving the posterior right frontal lobe as above. There was no significant mass effect or associated hemorrhage and there was atrophy with moderate chronic small vessel ischemic disease. There was no proximal branch occlusion or hemodynamically significant stenosis identified within the intracranial circulation. Carotid dopplers showed 1-39% stenosis bilaterally. Echo showed 55-60% EF, moderate aortic regurgitation, Mitral valve leaflet  appears very thickened, suggestive of myxomatous MV or vegetation which could be secondary to MV replacement/repair. Neurology considering transition to Plavix.  -TEE tomorrow -ASA 325 mg daily -Simvastatin 20 mg daily -Cardiac Diet -Neuro checks -PT/OT/SLP  -Telemetry -Permissive HTN -Hemoglobin A1c pending  MVR s/p Valve Repair: Patient had valve repair 5-6 years ago and has done well. Patient has an irregular rhythm on auscultation and on EKG. EKG shows frequent PVCs. Patient denies any known history of atrial fibrillation, nor is there a record of this in her chart. However, patient is on diltiazem 240 mg daily, metoprolol 25 mg daily at home.  -Cardiac monitor -Hold metoprolol and diltiazem to allow for permissive hypertension  L Knee Replacement: Patient had a left knee replacement on 10/26/14. Patient seen by Gus Puma, Ortho PA, who agreed with current therapy. Continue CPM machine, compression stockings, and physical therapy. She is okay to weight-bear as tolerated. Ortho recommends ASA BID on discharge until follow up with orthopedics. We will discuss DVT prophylaxis with neuro. Chest guidelines recommend LMWH for 10-14 days.  -PT/OT -CPM -Compression Stockings -ASA 325 mg daily -Heparin TID  HTN: BP on admission 150s/80s. Patient is normally on diltiazem 240 mg daily, lisinopril-HCTZ 20-12.5 mg daily and metoprolol  25 mg daily at home. -Hold meds for permissive HTN  HLD: Most recent lipid panel on 08/13/2007 shows cholesterol of 130, TG 74, HDL 65, LDL 50. Patient is on simvastatin 20 mg daily. Recent lipid panel shows cholesterol of 129, TG 105, HDL 46, LDL 62.  -Simvastatin 20 mg daily   DVT/PE ppx: Heparin SQ TID  Dispo: Disposition is deferred at this time, awaiting improvement of current medical problems.  Anticipated discharge in approximately 1-2 day(s).   The patient does have a current PCP Samson Frederic, MD) and does not need an [facility] hospital follow-up  appointment after discharge.  The patient does not have transportation limitations that hinder transportation to clinic appointments.  .Services Needed at time of discharge: Y = Yes, Blank = No PT:   OT:   RN:   Equipment:   Other:     LOS: 1 day   Jill Alexanders, DO PGY-1 Internal Medicine Resident Pager # [phone] 11/01/2014 10:45 AM

## 2014-11-01 NOTE — Evaluation (Signed)
Occupational Therapy Evaluation Patient Details Name: [name] MRN: [id number] DOB: [birth year] Today's Date: 11/01/2014    History of Present Illness 80 y.o. female admitted 10/31/14 with speech difficulty. + Rt corona radiata on MRI.  Marland Kitchen. Pt with significant PMHx 10/26/14 L TKA, R TKA, HTN, and mitral valve repair.   Clinical Impression   PT admitted with Rt corona radiata and recent L TKA. Pt currently with functional limitiations due to the deficits listed below (see OT problem list). PTA MOD I with DME Pt will benefit from skilled OT to increase their independence and safety with adls and balance to allow discharge no follow up and d/c home. Ot to follow acutely for L hand HEP.     Follow Up Recommendations  No OT follow up    Equipment Recommendations  None recommended by OT    Recommendations for Other Services       Precautions / Restrictions Precautions Precautions: Fall;Knee Precaution Comments: reinforced positioning for knee extension and decr edema Restrictions LLE Weight Bearing: Weight bearing as tolerated      Mobility Bed Mobility Overal bed mobility: Modified Independent                Transfers Overall transfer level: Needs assistance Equipment used: Rolling walker (2 wheeled) Transfers: Sit to/from Stand Sit to Stand: Min guard              Balance                                            ADL Overall ADL's : Needs assistance/impaired Eating/Feeding: Independent Eating/Feeding Details (indicate cue type and reason): using BIL UE Grooming: Wash/dry hands;Min Production designer, theatre/television/filmguard;Standing                   Toilet Transfer: Min guard;Ambulation   Toileting- Clothing Manipulation and Hygiene: Min guard;Sit to/from stand   Tub/ Shower Transfer: Tub transfer;Minimal assistance;Shower seat;Ambulation;Rolling walker;Grab bars Tub/Shower Transfer Details (indicate cue type and reason): daughter will (A) at home and educated  on transfer with good leg in and back leg out of tub Functional mobility during ADLs: Supervision/safety;Min guard;Rolling walker General ADL Comments: pt motivated to get better and asking when next session will occur. Pt very pleasant and following all instructions     Vision                     Perception     Praxis      Pertinent Vitals/Pain Pain Assessment: No/denies pain     Hand Dominance Right   Extremity/Trunk Assessment Upper Extremity Assessment Upper Extremity Assessment: LUE deficits/detail LUE Deficits / Details: grasp 3 out 5, Brunstrom III. Decr sensation   Lower Extremity Assessment Lower Extremity Assessment: Defer to PT evaluation   Cervical / Trunk Assessment Cervical / Trunk Assessment: Normal   Communication Communication Communication: No difficulties   Cognition Arousal/Alertness: Awake/alert Behavior During Therapy: WFL for tasks assessed/performed Overall Cognitive Status: Within Functional Limits for tasks assessed                     General Comments       Exercises       Shoulder Instructions      Home Living Family/patient expects to be discharged to:: Private residence Living Arrangements: Children Available Help at Discharge: Family;Available 24 hours/day Type of Home: House  Home Access: Stairs to enter Entrance Stairs-Number of Steps: 4 Entrance Stairs-Rails: Right Home Layout: One level     Bathroom Shower/Tub: Chief Strategy OfficerTub/shower unit   Bathroom Toilet: Standard     Home Equipment: Environmental consultantWalker - 2 wheels;Cane - single point;Bedside commode;Shower seat;Wheelchair - manual;Grab bars - toilet;Hand held shower head      Lives With: Daughter    Prior Functioning/Environment Level of Independence: Needs assistance    ADL's / Homemaking Assistance Needed: assist with sponge bath        OT Diagnosis: Generalized weakness;Acute pain   OT Problem List: Decreased strength;Decreased activity tolerance;Impaired  balance (sitting and/or standing);Pain   OT Treatment/Interventions: Self-care/ADL training;Therapeutic exercise;DME and/or AE instruction;Therapeutic activities;Balance training;Patient/family education    OT Goals(Current goals can be found in the care plan section) Acute Rehab OT Goals Patient Stated Goal: to make L hand work better OT Goal Formulation: With patient/family Time For Goal Achievement: 11/15/14 Potential to Achieve Goals: Good  OT Frequency: Min 2X/week   Barriers to D/C:            Co-evaluation              End of Session Equipment Utilized During Treatment: Rolling walker CPM Left Knee CPM Left Knee: Off Nurse Communication: Mobility status;Precautions  Activity Tolerance: Patient tolerated treatment well Patient left: in chair;with call bell/phone within reach;with family/visitor present;with chair alarm set   Time: [id number] OT Time Calculation (min): 21 min Charges:  OT General Charges $OT Visit: 1 Procedure OT Evaluation $Initial OT Evaluation Tier I: 1 Procedure OT Treatments $Self Care/Home Management : 8-22 mins G-Codes:    Boone MasterJones, Adama Ferber B 11/01/2014, 4:06 PM Pager: [phone]

## 2014-11-01 NOTE — Progress Notes (Signed)
CARE MANAGEMENT NOTE 11/01/2014  Patient:  [name],[name]   Account Number:  [id number]  Date Initiated:  11/01/2014  Documentation initiated by:  Jiles CrockerHANDLER,Melissa Pulido  Subjective/Objective Assessment:   ADMITTED WITH STROKE     Action/Plan:   CM FOLLOWING FOR DCP   Anticipated DC Date:  11/05/2014   Anticipated DC Plan:  AWAITING FOR PT/OT EVALS FOR DISPOSITION NEEDS     DC Planning Services  CM consult         Status of service:  In process, will continue to follow Medicare Important Message given?   (If response is "NO", the following Medicare IM given date fields will be blank)  Per UR Regulation:  Reviewed for med. necessity/level of care/duration of stay Comments:  11/17/2015Abelino Derrick- B Jaleal Schliep RN,BSN,MHA [phone]

## 2014-11-01 NOTE — Progress Notes (Signed)
Subjective:  Patient reports that she did well last night. She feels that her neurological deficits have improved, but that her L hand is still very stiff and has decreased sensation, improved from numbness yesterday. She also reports that she has not had a bowel movement since last Tuesday, before her TKA. She has been taking a fiber supplement she bought [facility] but it has not helped her. She endorses some mild L-sided abdominal discomfort. She denies any chest pain, shob, N/V, or headache. She says that she feels most comfortable when she is leaning forward.  Objective: Vital signs in last 24 hours: Filed Vitals:   10/31/14 2200 11/01/14 0000 11/01/14 0200 11/01/14 0626  BP: 144/72 131/89 133/79 153/85  Pulse: 103 102 108 105  Temp: 98.8 F (37.1 C) 98.9 F (37.2 C) 98.6 F (37 C) 100 F (37.8 C)  TempSrc: Oral Oral Oral Oral  Resp: 16 16  18   Height:      Weight:      SpO2: 100% 98% 98% 100%    Intake/Output Summary (Last 24 hours) at 11/01/14 0932 Last data filed at 11/01/14 0835  Gross per 24 hour  Intake    120 ml  Output      0 ml  Net    120 ml   BP 154/88 mmHg  Pulse 102  Temp(Src) 99.1 F (37.3 C) (Oral)  Resp 18  Ht 4\' 11"  (1.499 m)  Wt 61.236 kg (135 lb)  BMI 27.25 kg/m2  SpO2 99%   Vital signs reviewed and are stable General appearance: alert, cooperative and no distress Head: Normocephalic, without obvious abnormality, atraumatic Eyes: conjunctivae/corneas clear. PERRL, EOM's intact. Fundi benign. Throat: normal findings: tongue midline and normal Neck: no adenopathy, no carotid bruit, no JVD, supple, symmetrical, trachea midline and thyroid not enlarged, symmetric, no tenderness/mass/nodules Lungs: clear to auscultation bilaterally Heart: regular rate and rhythm and friction rub heard over left sternal border Abdomen: soft, non-tender; bowel sounds normal; no masses,  no organomegaly Extremities: Well-healing surgical incision over L  knee Neurologic: Mental status: Alert, oriented, thought content appropriate Cranial nerves: normal Sensory: reduced tactile sense over lateral aspect of L arm and medial hand  Motor: 4/5 grip strength L hand. 5/5 R hand.     Lab Results: Basic Metabolic Panel:  Recent Labs Lab 10/31/14 1038 10/31/14 1046 11/01/14 0743  NA 135* 135* 136*  K 3.0* 2.8* 4.1  CL 96 96 99  CO2 25  --  24  GLUCOSE 105* 105* 101*  BUN 16 14 10   CREATININE 0.79 0.80 0.72  CALCIUM 8.8  --  8.7   Liver Function Tests:  Recent Labs Lab 10/31/14 1038  AST 40*  ALT 15  ALKPHOS 83  BILITOT 0.7  PROT 6.4  ALBUMIN 2.7*   CBC:  Recent Labs Lab 10/28/14 0409 10/31/14 1038 10/31/14 1046  WBC 11.4* 5.8  --   NEUTROABS  --  3.9  --   HGB 8.6* 8.2* 8.8*  HCT 26.0* 25.3* 26.0*  MCV 79.8 81.4  --   PLT 146* 231  --    Cardiac Enzymes:  Recent Labs Lab 10/31/14 2143 11/01/14 0129 11/01/14 0743  TROPONINI 0.69* 0.66* 0.38*   CBG:  Recent Labs Lab 10/31/14 1102  GLUCAP 90   Fasting Lipid Panel:  Recent Labs Lab 11/01/14 0129  CHOL 129  HDL 46  LDLCALC 62  TRIG 105  CHOLHDL 2.8   Coagulation:  Recent Labs Lab 10/31/14 1038  LABPROT 15.3*  INR 1.19   Studies/Results: Ct Head (brain) Wo Contrast  10/31/2014   CLINICAL DATA:  Code stroke.  Left hand numbness.  EXAM: CT HEAD WITHOUT CONTRAST  TECHNIQUE: Contiguous axial images were obtained from the base of the skull through the vertex without intravenous contrast.  COMPARISON:  None.  FINDINGS: Focal area of low attenuation within the left basal ganglia compatible with subacute to chronic lacunar infarct. There is mild patchy low attenuation within the periventricular and subcortical white matter compatible with chronic microvascular disease. The mastoid air cells and the paranasal sinuses are clear. The calvarium appears intact.  IMPRESSION: 1. No acute intracranial abnormalities. 2. Chronic small vessel ischemic disease.    Electronically Signed   By: Signa Kellaylor  Stroud M.D.   On: 10/31/2014 10:37   Mr Brain Wo Contrast  10/31/2014   CLINICAL DATA:  Initial evaluation for acute onset left facial droop, decreased sensation and left hand  EXAM: MRI HEAD WITHOUT CONTRAST  MRA HEAD WITHOUT CONTRAST  TECHNIQUE: Multiplanar, multiecho pulse sequences of the brain and surrounding structures were obtained without intravenous contrast. Angiographic images of the head were obtained using MRA technique without contrast.  COMPARISON:  Prior noncontrast head CT from earlier the same day.  FINDINGS: MRI HEAD FINDINGS  There is a subtle curvilinear focus of restricted diffusion within the cortex of the posterior or right frontal lobe (series 4, image 22), of compatible with acute ischemic infarct. No significant mass effect or associated hemorrhage. No other acute ischemic infarct identified. Gray-white matter differentiation otherwise maintained. Normal flow voids seen within the intracranial vasculature.  Mild diffuse prominence of the CSF containing spaces is compatible with generalized age-related cerebral atrophy. Patchy T2/FLAIR hyperintensities within the periventricular deep white matter both cerebral hemispheres is present, nonspecific, but likely related to moderate chronic small vessel ischemic changes. Subcentimeter hypo intense focus within the posterior left frontal lobe on gradient echo sequence likely reflects a small chronic microhemorrhage. Probable additional small microhemorrhage present within the right thalamus.  No mass lesion or midline shift. Ventricles are normal in size without evidence of hydrocephalus. No extra-axial fluid collection.  Pituitary gland within normal limits. Craniocervical junction unremarkable. No acute abnormality seen about the orbits.  Mild mucosal thickening present within the inferior left maxillary sinus. Paranasal sinuses are otherwise clear. No mastoid effusion.  Bone marrow signal intensity within  normal limits. Visualized upper cervical spine unremarkable.  MRA HEAD FINDINGS  ANTERIOR CIRCULATION:  Visualized distal cervical segments of the internal carotid arteries are widely patent with antegrade flow. The petrous, cavernous, and clinoid segments are widely patent without hemodynamically significant stenosis. Mild irregularity within the cavernous right ICA likely related to underlying atheromatous plaque. A1 segments, anterior communicating artery, and anterior cerebral arteries are within normal limits.  M1 segments are widely patent bilaterally without proximal branch occlusion or hemodynamically significant stenosis. Distal MCA branches well opacified bilaterally.  POSTERIOR CIRCULATION:  Vertebral arteries are codominant and widely patent bilaterally. Partially visualized posterior inferior cerebral arteries patent. Vertebrobasilar junction is within normal limits. The basilar artery itself is mildly tortuous but widely patent. Posterior cerebral arteries and superior cerebral arteries are patent bilaterally.  No convincing evidence for aneurysm or vascular malformation.  IMPRESSION: MRI HEAD IMPRESSION:  1. Small subtle cortical ischemic infarct involving the posterior right frontal lobe as above. No significant mass effect or associated hemorrhage. 2. Atrophy with moderate chronic small vessel ischemic disease.  MRA HEAD IMPRESSION:  No proximal branch occlusion or hemodynamically significant stenosis identified within the  intracranial circulation.   Electronically Signed   By: Rise Mu M.D.   On: 10/31/2014 22:23   Mr Maxine Glenn Head/brain Wo Cm  10/31/2014   CLINICAL DATA:  Initial evaluation for acute onset left facial droop, decreased sensation and left hand  EXAM: MRI HEAD WITHOUT CONTRAST  MRA HEAD WITHOUT CONTRAST  TECHNIQUE: Multiplanar, multiecho pulse sequences of the brain and surrounding structures were obtained without intravenous contrast. Angiographic images of the head were  obtained using MRA technique without contrast.  COMPARISON:  Prior noncontrast head CT from earlier the same day.  FINDINGS: MRI HEAD FINDINGS  There is a subtle curvilinear focus of restricted diffusion within the cortex of the posterior or right frontal lobe (series 4, image 22), of compatible with acute ischemic infarct. No significant mass effect or associated hemorrhage. No other acute ischemic infarct identified. Gray-white matter differentiation otherwise maintained. Normal flow voids seen within the intracranial vasculature.  Mild diffuse prominence of the CSF containing spaces is compatible with generalized age-related cerebral atrophy. Patchy T2/FLAIR hyperintensities within the periventricular deep white matter both cerebral hemispheres is present, nonspecific, but likely related to moderate chronic small vessel ischemic changes. Subcentimeter hypo intense focus within the posterior left frontal lobe on gradient echo sequence likely reflects a small chronic microhemorrhage. Probable additional small microhemorrhage present within the right thalamus.  No mass lesion or midline shift. Ventricles are normal in size without evidence of hydrocephalus. No extra-axial fluid collection.  Pituitary gland within normal limits. Craniocervical junction unremarkable. No acute abnormality seen about the orbits.  Mild mucosal thickening present within the inferior left maxillary sinus. Paranasal sinuses are otherwise clear. No mastoid effusion.  Bone marrow signal intensity within normal limits. Visualized upper cervical spine unremarkable.  MRA HEAD FINDINGS  ANTERIOR CIRCULATION:  Visualized distal cervical segments of the internal carotid arteries are widely patent with antegrade flow. The petrous, cavernous, and clinoid segments are widely patent without hemodynamically significant stenosis. Mild irregularity within the cavernous right ICA likely related to underlying atheromatous plaque. A1 segments, anterior  communicating artery, and anterior cerebral arteries are within normal limits.  M1 segments are widely patent bilaterally without proximal branch occlusion or hemodynamically significant stenosis. Distal MCA branches well opacified bilaterally.  POSTERIOR CIRCULATION:  Vertebral arteries are codominant and widely patent bilaterally. Partially visualized posterior inferior cerebral arteries patent. Vertebrobasilar junction is within normal limits. The basilar artery itself is mildly tortuous but widely patent. Posterior cerebral arteries and superior cerebral arteries are patent bilaterally.  No convincing evidence for aneurysm or vascular malformation.  IMPRESSION: MRI HEAD IMPRESSION:  1. Small subtle cortical ischemic infarct involving the posterior right frontal lobe as above. No significant mass effect or associated hemorrhage. 2. Atrophy with moderate chronic small vessel ischemic disease.  MRA HEAD IMPRESSION:  No proximal branch occlusion or hemodynamically significant stenosis identified within the intracranial circulation.   Electronically Signed   By: Rise Mu M.D.   On: 10/31/2014 22:23   Carotid Duplex Summary: Bilateral: 1-39% ICA stenosis. Vertebral artery flow is antegrade.  Medications: I have reviewed the patient's current medications. Scheduled Meds: . aspirin  300 mg Rectal Daily   Or  . aspirin  325 mg Oral Daily  . heparin  5,000 Units Subcutaneous 3 times per day  . pantoprazole  40 mg Oral Daily  . simvastatin  20 mg Oral q1800   PRN Meds:.HYDROcodone-acetaminophen   Assessment/Plan: Active Problems:   CVA (cerebral infarction)   Acute ischemic stroke   Elevated troponin  Absolute anemia  80 y.o. Female with PMHx of HTN, HLD on HD 1 with an acute ischemic stroke.  1.) Acute ischemic stroke: Focal neurological deficits persist > 24 hours, and MRI findings are consistent with an acute ischemic stroke vs. TIA.    - ASA 325 mg one   - Simvastatin 20 mg  daily   - 2D echo   - Cardiac Diet   - Hemoglobin A1c   - Neuro checks   - NIH Stroke Scale   - PT/OT/SLP    - Telemetry   - Permissive HTN  2.) Pericardial rub: Patient denies any symptoms of pericarditis, but endorses feeling better when she leans forward   - Re-evaluate after echocardiogram has been completed  3.) Atrial fibrillation: Likely does not have. Was likely MVR d/t Valvuloplasty 5-6 years ago. EKG showing frequent PVCs. Has normal rate and irregular rhythm on exam. Cannot find documentation of actual proof of Atrial fibrillation. - Hold home metoprolol to allow for permissive hypertension - Echocardiogram to assess for valvulopathy/accessory heart sound  4.) HTN: 153/85.    - Hold home meds to allow for permissive hypertension.  5.) HLD: Lipid panel above looks good   - Continue home Simvastatin 20mg  QD  6.) GERD: Patient takes omeprazole 20mg  QD - Pantoprazole 40mg  QD  7.) DVT/PE Ppx: Heparin SQ TID  Dispo: Disposition is deferred at this time, awaiting improvement of current medical problems.  Anticipated discharge in approximately 1-2 day(s).   The patient does have a current PCP Samson Frederic, MD) and does not need an [facility] hospital follow-up appointment after discharge.  The patient does not have transportation limitations that hinder transportation to clinic appointments.  .Services Needed at time of discharge: Y = Yes, Blank = No PT:   OT:   RN:   Equipment:   Other:     LOS: 1 day   Charlotta Newton, Med Student 11/01/2014, 9:32 AM

## 2014-11-01 NOTE — Progress Notes (Signed)
INITIAL NUTRITION ASSESSMENT  DOCUMENTATION CODES Per approved criteria  -Not Applicable   INTERVENTION: Provide Resource Breeze once daily Provide snack once daily Encourage PO intake  NUTRITION DIAGNOSIS: Inadequate oral intake related to poor appetite as evidenced by 7% unintended weight loss and 50% meal completion.   Goal: Pt to meet >/= 90% of their estimated nutrition needs   Monitor:  PO intake, weight trend, labs  Reason for Assessment: Malnutrition Screening Tool, score of 3  80 y.o. female  Admitting Dx: Stroke  ASSESSMENT: 80 year old woman with history of hypertension, hyperlipidemia, peptic ulcer disease, status post mitral valve repair for mitral valve prolapse with severe mitral regurgitation, and status post bilateral total knee replacement admitted with complaint of left hand weakness and numbness and left facial numbness/droop.  Per MD note, Stroke: Non-dominant right posterior frontal lobe infarct secondary to small vessel disease source  Pt reports that she doesn't typically have an appetite or feel hungry but, she eats 3 times daily. Per pt's daughter pt has not been eating much. Pt reports eating about 50% of her meals today. Pt reports usual body weight of 145 lbs. Per weight history pt has lost 10 lbs in the past 5 months- 7% weight loss non-severe for time frame. Per nursing note, pt's meal completion has been 50-75% today.  Pt reports last BM was one week ago. RD provided diet tips to help relieve/prevent constipation. Labs reviewed.   Nutrition Focused Physical Exam:  Subcutaneous Fat:  Orbital Region: wnl Upper Arm Region: moderate wasting Thoracic and Lumbar Region: NA  Muscle:  Temple Region: mild wasting Clavicle Bone Region: moderate wasting Clavicle and Acromion Bone Region: mild wasting Scapular Bone Region: NA Dorsal Hand: mild wasting Patellar Region: wnl Anterior Thigh Region: wnl Posterior Calf Region: wnl  Edema: non-pitting  LLE edema   Height: Ht Readings from Last 1 Encounters:  10/31/14 4\' 11"  (1.499 m)    Weight: Wt Readings from Last 1 Encounters:  10/31/14 135 lb (61.236 kg)    Ideal Body Weight: 98 lbs  % Ideal Body Weight: 138%  Wt Readings from Last 10 Encounters:  10/31/14 135 lb (61.236 kg)  10/26/14 136 lb (61.689 kg)  10/18/14 136 lb 3.2 oz (61.78 kg)  06/03/14 145 lb (65.772 kg)  05/25/14 145 lb 1.6 oz (65.817 kg)    Usual Body Weight: 145 lbs  % Usual Body Weight: 93%  BMI:  Body mass index is 27.25 kg/(m^2).  Estimated Nutritional Needs: Kcal: 1400-1600 Protein: 70-80 grams Fluid: 1.6 L/day  Skin: non-pitting LLE edema  Diet Order: Diet Heart  EDUCATION NEEDS: -No education needs identified at this time   Intake/Output Summary (Last 24 hours) at 11/01/14 1625 Last data filed at 11/01/14 1430  Gross per 24 hour  Intake    240 ml  Output      0 ml  Net    240 ml    Last BM: 11/9   Labs:   Recent Labs Lab 10/28/14 0409 10/31/14 1038 10/31/14 1046 11/01/14 0743  NA 137 135* 135* 136*  K 3.2* 3.0* 2.8* 4.1  CL 97 96 96 99  CO2 22 25  --  24  BUN 13 16 14 10   CREATININE 0.85 0.79 0.80 0.72  CALCIUM 8.3* 8.8  --  8.7  GLUCOSE 112* 105* 105* 101*    CBG (last 3)   Recent Labs  10/31/14 1102  GLUCAP 90    Scheduled Meds: . [START ON 11/02/2014] clopidogrel  75 mg Oral  Daily  . heparin  5,000 Units Subcutaneous 3 times per day  . oxymetazoline  1 spray Each Nare BID  . pantoprazole  40 mg Oral Daily  . senna-docusate  2 tablet Oral Daily  . simvastatin  20 mg Oral q1800    Continuous Infusions:   Past Medical History  Diagnosis Date  . Family history of anesthesia complication     Daughter; takes a long time to wake up  . Hypertension   . Shortness of breath     while walking  . Frequency of urination     at time  . GERD (gastroesophageal reflux disease)   . Arthritis   . Hyperlipemia   . Atrial fibrillation 2010    Pt had  mitral valve replacement in 2010 that put pt back into NSR.    Past Surgical History  Procedure Laterality Date  . Mitral valve repair    . Abdominal hysterectomy    . Eye surgery Bilateral     cataract removed; lens implant  . Total knee arthroplasty Right 06/03/2014    Procedure: RIGHT TOTAL KNEE ARTHROPLASTY ;  Surgeon: Harvie JuniorJohn L Graves, MD;  Location: [facility] OR;  Service: Orthopedics;  Laterality: Right;  . Total knee arthroplasty Left 10/26/2014    Procedure: LEFT TOTAL KNEE ARTHROPLASTY;  Surgeon: Harvie JuniorJohn L Graves, MD;  Location: [facility] OR;  Service: Orthopedics;  Laterality: Left;  . Cholecystectomy      Ian Malkineanne Barnett RD, LDN Inpatient Clinical Dietitian Pager: [phone] After Hours Pager: [phone]

## 2014-11-01 NOTE — Progress Notes (Addendum)
    [facility] has been requested to perform a transesophageal echocardiogram on [name] for stroke work up and closer look at her mitral valve.  After careful review of history and examination, the risks and benefits of transesophageal echocardiogram have been explained including risks of esophageal damage, perforation (1:10,000 risk), bleeding, pharyngeal hematoma as well as other potential complications associated with conscious sedation including aspiration, arrhythmia, respiratory failure and death. Alternatives to treatment were discussed, questions were answered. Patient is willing to proceed.   The procedure is at 0900hrs tomorrow  Wilburt FinlayHAGER, Maynard David, [facility] 11/01/2014 4:00 PM

## 2014-11-02 ENCOUNTER — Encounter (HOSPITAL_COMMUNITY): Payer: Self-pay | Admitting: *Deleted

## 2014-11-02 ENCOUNTER — Encounter (HOSPITAL_COMMUNITY)
Admission: EM | Disposition: A | Discharge status: Home Health | Payer: Self-pay | Source: Home / Self Care | Attending: Internal Medicine

## 2014-11-02 DIAGNOSIS — I341 Nonrheumatic mitral (valve) prolapse: Secondary | ICD-10-CM

## 2014-11-02 HISTORY — PX: TEE WITHOUT CARDIOVERSION: SHX5443

## 2014-11-02 LAB — BASIC METABOLIC PANEL
ANION GAP: 13 (ref 5–15)
BUN: 9 mg/dL (ref 6–23)
CHLORIDE: 97 meq/L (ref 96–112)
CO2: 23 meq/L (ref 19–32)
Calcium: 8.8 mg/dL (ref 8.4–10.5)
Creatinine, Ser: 0.75 mg/dL (ref 0.50–1.10)
GFR calc Af Amer: >90 mL/min (ref >90)
GFR calc non Af Amer: 80 mL/min — ABNORMAL LOW (ref >90)
Glucose, Bld: 96 mg/dL (ref 70–99)
Potassium: 4.4 mEq/L (ref 3.7–5.3)
Sodium: 133 mEq/L — ABNORMAL LOW (ref 137–147)

## 2014-11-02 SURGERY — ECHOCARDIOGRAM, TRANSESOPHAGEAL
Anesthesia: Moderate Sedation

## 2014-11-02 MED ORDER — ENOXAPARIN SODIUM 40 MG/0.4ML ~~LOC~~ SOLN: 40.0000 mg | Freq: Every day | SUBCUTANEOUS | Status: DC

## 2014-11-02 MED ORDER — ENOXAPARIN SODIUM 40 MG/0.4ML ~~LOC~~ SOLN: 40.0000 mg | SUBCUTANEOUS | Status: DC

## 2014-11-02 MED ORDER — MIDAZOLAM HCL 10 MG/2ML IJ SOLN
INTRAMUSCULAR | Status: DC | PRN
  Administered 2014-11-02: 2 mg via INTRAVENOUS
  Administered 2014-11-02: 1 mg via INTRAVENOUS

## 2014-11-02 MED ORDER — FENTANYL CITRATE 0.05 MG/ML IJ SOLN
INTRAMUSCULAR | Status: AC
  Filled 2014-11-02: qty 2

## 2014-11-02 MED ORDER — SODIUM CHLORIDE 0.9 % IV SOLN
INTRAVENOUS | Status: DC
  Administered 2014-11-02: 09:00:00 via INTRAVENOUS

## 2014-11-02 MED ORDER — ENOXAPARIN SODIUM 30 MG/0.3ML ~~LOC~~ SOLN: 30.0000 mg | Freq: Two times a day (BID) | SUBCUTANEOUS | Status: DC

## 2014-11-02 MED ORDER — MIDAZOLAM HCL 5 MG/ML IJ SOLN
INTRAMUSCULAR | Status: AC
  Filled 2014-11-02: qty 2

## 2014-11-02 MED ORDER — FENTANYL CITRATE 0.05 MG/ML IJ SOLN
INTRAMUSCULAR | Status: DC | PRN
  Administered 2014-11-02: 25 ug via INTRAVENOUS

## 2014-11-02 MED ORDER — DIPHENHYDRAMINE HCL 50 MG/ML IJ SOLN
INTRAMUSCULAR | Status: AC
  Filled 2014-11-02: qty 1

## 2014-11-02 MED ORDER — CLOPIDOGREL BISULFATE 75 MG PO TABS: 75.0000 mg | ORAL_TABLET | Freq: Every day | ORAL | Status: AC

## 2014-11-02 MED ORDER — ENOXAPARIN SODIUM 30 MG/0.3ML ~~LOC~~ SOLN
30.0000 mg | Freq: Two times a day (BID) | SUBCUTANEOUS | Status: DC
  Administered 2014-11-02: 30 mg via SUBCUTANEOUS
  Filled 2014-11-02: qty 0.3

## 2014-11-02 MED ORDER — ENOXAPARIN (LOVENOX) PATIENT EDUCATION KIT
PACK | Freq: Once | Status: AC
  Administered 2014-11-02: 13:00:00
  Filled 2014-11-02: qty 1

## 2014-11-02 MED ORDER — BUTAMBEN-TETRACAINE-BENZOCAINE 2-2-14 % EX AERO
INHALATION_SPRAY | CUTANEOUS | Status: DC | PRN
  Administered 2014-11-02: 2 via TOPICAL

## 2014-11-02 MED ORDER — LIDOCAINE VISCOUS 2 % MT SOLN
OROMUCOSAL | Status: AC
  Filled 2014-11-02: qty 15

## 2014-11-02 NOTE — Progress Notes (Signed)
OT Cancellation Note  Patient Details Name: Janet GrumblesSusan A Schiavi MRN: [id number] DOB: May 31, 1934   Cancelled Treatment:    Reason Eval/Treat Not Completed: Patient at procedure or test/ unavailable (TEE)  Boone MasterJones, Delphin Funes B 11/02/2014, 9:46 AM

## 2014-11-02 NOTE — Progress Notes (Signed)
Subjective:  Patient reports that she still has not had a bowel movement. She is still having some dull aching L sided abdominal pain and is passing flatus. She still complains of decreased sensation in her 2nd and third L phalanges and that she is still having difficulty using that hand. Patient feels ready to be discharged.  Objective: Vital signs in last 24 hours: Filed Vitals:   11/02/14 0955 11/02/14 1000 11/02/14 1005 11/02/14 1100  BP: 140/79 138/76 140/89 143/83  Pulse: 103 99  99  Temp:    98.9 F (37.2 C)  TempSrc:    Oral  Resp: 20 19  20   Height:      Weight:      SpO2: 97% 97%  98%    Intake/Output Summary (Last 24 hours) at 11/02/14 1245 Last data filed at 11/01/14 1430  Gross per 24 hour  Intake    120 ml  Output      0 ml  Net    120 ml   BP 143/83 mmHg  Pulse 99  Temp(Src) 98.9 F (37.2 C) (Oral)  Resp 20  Ht 4\' 11"  (1.499 m)  Wt 61.236 kg (135 lb)  BMI 27.25 kg/m2  SpO2 98%   Vital signs reviewed and are stable. General appearance: alert, cooperative and no distress Head: Normocephalic, without obvious abnormality, atraumatic Lungs: clear to auscultation bilaterally Heart:regular rate and rhythm, S1, S2 normal, no murmur, click, rub or gallop Abdomen: Mild TTP along left side Neurologic: Cranial nerves: normal Motor: Grip strength 4/5 L hand, 5/5 R hand    Sensory: decreased sensation in distal 2nd and 3rd phalanges  Lab Results: Basic Metabolic Panel:  Recent Labs Lab 11/01/14 0743 11/02/14 0740  NA 136* 133*  K 4.1 4.4  CL 99 97  CO2 24 23  GLUCOSE 101* 96  BUN 10 9  CREATININE 0.72 0.75  CALCIUM 8.7 8.8   Liver Function Tests:  Recent Labs Lab 10/31/14 1038  AST 40*  ALT 15  ALKPHOS 83  BILITOT 0.7  PROT 6.4  ALBUMIN 2.7*   CBC:  Recent Labs Lab 10/28/14 0409 10/31/14 1038 10/31/14 1046  WBC 11.4* 5.8  --   NEUTROABS  --  3.9  --   HGB 8.6* 8.2* 8.8*  HCT 26.0* 25.3* 26.0*  MCV 79.8 81.4  --   PLT 146* 231   --    Cardiac Enzymes:  Recent Labs Lab 11/01/14 0743 11/01/14 1359 11/01/14 2003  TROPONINI 0.38* 0.31* <0.30   CBG:  Recent Labs Lab 10/31/14 1102  GLUCAP 90   Hemoglobin A1C:  Recent Labs Lab 11/01/14 0129  HGBA1C 5.8*   Fasting Lipid Panel:  Recent Labs Lab 11/01/14 0129  CHOL 129  HDL 46  LDLCALC 62  TRIG 105  CHOLHDL 2.8   Coagulation:  Recent Labs Lab 10/31/14 1038  LABPROT 15.3*  INR 1.19   Studies/Results: Mr Brain Wo Contrast  10/31/2014   CLINICAL DATA:  Initial evaluation for acute onset left facial droop, decreased sensation and left hand  EXAM: MRI HEAD WITHOUT CONTRAST  MRA HEAD WITHOUT CONTRAST  TECHNIQUE: Multiplanar, multiecho pulse sequences of the brain and surrounding structures were obtained without intravenous contrast. Angiographic images of the head were obtained using MRA technique without contrast.  COMPARISON:  Prior noncontrast head CT from earlier the same day.  FINDINGS: MRI HEAD FINDINGS  There is a subtle curvilinear focus of restricted diffusion within the cortex of the posterior or right frontal lobe (series  4, image 22), of compatible with acute ischemic infarct. No significant mass effect or associated hemorrhage. No other acute ischemic infarct identified. Gray-white matter differentiation otherwise maintained. Normal flow voids seen within the intracranial vasculature.  Mild diffuse prominence of the CSF containing spaces is compatible with generalized age-related cerebral atrophy. Patchy T2/FLAIR hyperintensities within the periventricular deep white matter both cerebral hemispheres is present, nonspecific, but likely related to moderate chronic small vessel ischemic changes. Subcentimeter hypo intense focus within the posterior left frontal lobe on gradient echo sequence likely reflects a small chronic microhemorrhage. Probable additional small microhemorrhage present within the right thalamus.  No mass lesion or midline shift.  Ventricles are normal in size without evidence of hydrocephalus. No extra-axial fluid collection.  Pituitary gland within normal limits. Craniocervical junction unremarkable. No acute abnormality seen about the orbits.  Mild mucosal thickening present within the inferior left maxillary sinus. Paranasal sinuses are otherwise clear. No mastoid effusion.  Bone marrow signal intensity within normal limits. Visualized upper cervical spine unremarkable.  MRA HEAD FINDINGS  ANTERIOR CIRCULATION:  Visualized distal cervical segments of the internal carotid arteries are widely patent with antegrade flow. The petrous, cavernous, and clinoid segments are widely patent without hemodynamically significant stenosis. Mild irregularity within the cavernous right ICA likely related to underlying atheromatous plaque. A1 segments, anterior communicating artery, and anterior cerebral arteries are within normal limits.  M1 segments are widely patent bilaterally without proximal branch occlusion or hemodynamically significant stenosis. Distal MCA branches well opacified bilaterally.  POSTERIOR CIRCULATION:  Vertebral arteries are codominant and widely patent bilaterally. Partially visualized posterior inferior cerebral arteries patent. Vertebrobasilar junction is within normal limits. The basilar artery itself is mildly tortuous but widely patent. Posterior cerebral arteries and superior cerebral arteries are patent bilaterally.  No convincing evidence for aneurysm or vascular malformation.  IMPRESSION: MRI HEAD IMPRESSION:  1. Small subtle cortical ischemic infarct involving the posterior right frontal lobe as above. No significant mass effect or associated hemorrhage. 2. Atrophy with moderate chronic small vessel ischemic disease.  MRA HEAD IMPRESSION:  No proximal branch occlusion or hemodynamically significant stenosis identified within the intracranial circulation.   Electronically Signed   By: Rise MuBenjamin  McClintock M.D.   On:  10/31/2014 22:23   Mr Maxine GlennMra Head/brain Wo Cm  10/31/2014   CLINICAL DATA:  Initial evaluation for acute onset left facial droop, decreased sensation and left hand  EXAM: MRI HEAD WITHOUT CONTRAST  MRA HEAD WITHOUT CONTRAST  TECHNIQUE: Multiplanar, multiecho pulse sequences of the brain and surrounding structures were obtained without intravenous contrast. Angiographic images of the head were obtained using MRA technique without contrast.  COMPARISON:  Prior noncontrast head CT from earlier the same day.  FINDINGS: MRI HEAD FINDINGS  There is a subtle curvilinear focus of restricted diffusion within the cortex of the posterior or right frontal lobe (series 4, image 22), of compatible with acute ischemic infarct. No significant mass effect or associated hemorrhage. No other acute ischemic infarct identified. Gray-white matter differentiation otherwise maintained. Normal flow voids seen within the intracranial vasculature.  Mild diffuse prominence of the CSF containing spaces is compatible with generalized age-related cerebral atrophy. Patchy T2/FLAIR hyperintensities within the periventricular deep white matter both cerebral hemispheres is present, nonspecific, but likely related to moderate chronic small vessel ischemic changes. Subcentimeter hypo intense focus within the posterior left frontal lobe on gradient echo sequence likely reflects a small chronic microhemorrhage. Probable additional small microhemorrhage present within the right thalamus.  No mass lesion or midline shift. Ventricles  are normal in size without evidence of hydrocephalus. No extra-axial fluid collection.  Pituitary gland within normal limits. Craniocervical junction unremarkable. No acute abnormality seen about the orbits.  Mild mucosal thickening present within the inferior left maxillary sinus. Paranasal sinuses are otherwise clear. No mastoid effusion.  Bone marrow signal intensity within normal limits. Visualized upper cervical spine  unremarkable.  MRA HEAD FINDINGS  ANTERIOR CIRCULATION:  Visualized distal cervical segments of the internal carotid arteries are widely patent with antegrade flow. The petrous, cavernous, and clinoid segments are widely patent without hemodynamically significant stenosis. Mild irregularity within the cavernous right ICA likely related to underlying atheromatous plaque. A1 segments, anterior communicating artery, and anterior cerebral arteries are within normal limits.  M1 segments are widely patent bilaterally without proximal branch occlusion or hemodynamically significant stenosis. Distal MCA branches well opacified bilaterally.  POSTERIOR CIRCULATION:  Vertebral arteries are codominant and widely patent bilaterally. Partially visualized posterior inferior cerebral arteries patent. Vertebrobasilar junction is within normal limits. The basilar artery itself is mildly tortuous but widely patent. Posterior cerebral arteries and superior cerebral arteries are patent bilaterally.  No convincing evidence for aneurysm or vascular malformation.  IMPRESSION: MRI HEAD IMPRESSION:  1. Small subtle cortical ischemic infarct involving the posterior right frontal lobe as above. No significant mass effect or associated hemorrhage. 2. Atrophy with moderate chronic small vessel ischemic disease.  MRA HEAD IMPRESSION:  No proximal branch occlusion or hemodynamically significant stenosis identified within the intracranial circulation.   Electronically Signed   By: Rise Mu M.D.   On: 10/31/2014 22:23   Carotid Duplex Summary: Bilateral: 1-39% ICA stenosis. Vertebral artery flow is antegrade.  2D Echocardiogram Study Conclusions  - Left ventricle: The cavity size was normal. There was mild concentric hypertrophy. Systolic function was normal. The estimated ejection fraction was in the range of 55% to 60%. Wall motion was normal; there were no regional wall motion abnormalities. - Aortic valve:  Trileaflet; normal thickness, mildly calcified leaflets. There was moderate regurgitation. - Mitral valve: In the apical 4 chamber view the anterior mitral valve leaflet appears very thickened, suggestive of either a myoxomatous MV or a vegeation. Suggest TEE if clinically indicated for further evaluation. Prior procedures included surgical repair. An annular ring prosthesis was present. Moderate diffuse thickening. Moderate diffuse calcification, with moderate involvement of chords. The findings are consistent with severe stenosis. Valve area by continuity equation (using LVOT flow): 1.22 cm^2. - Left atrium: The atrium was severely dilated. - Right atrium: The atrium was mildly dilated. - Tricuspid valve: There was moderate regurgitation. - Pulmonary arteries: PA peak pressure: 76 mm Hg (S). Impressions: - The right ventricular systolic pressure was increased consistent with severe pulmonary hypertension.  Transesophageal Echocardiogram Impression:  Moderate - severe mitral stenosis. Moderate pulmonary hypertension - RV/RA gradient of 61 ~ PA pressure of ~ 71 mm Hg Well preserved LV function.   Medications: I have reviewed the patient's current medications. Scheduled Meds: . clopidogrel  75 mg Oral Daily  . enoxaparin (LOVENOX) injection  40 mg Subcutaneous Q24H  . enoxaparin   Does not apply Once  . feeding supplement (RESOURCE BREEZE)  1 Container Oral Q24H  . oxymetazoline  1 spray Each Nare BID  . pantoprazole  40 mg Oral Daily  . simvastatin  20 mg Oral q1800   PRN Meds:.HYDROcodone-acetaminophen   Assessment/Plan: Active Problems:   CVA (cerebral infarction)   Acute ischemic stroke   Elevated troponin   Absolute anemia   Cerebral infarction due to unspecified  mechanism  80 y.o. Female with PMHx of HTN, HLD, and mitral valve repair on HD 2 for an acute ischemic stroke  1.) Acute CVA: Focal neurological deficits persist > 24 hours, and MRI  findings are consistent with an acute ischemic stroke vs. TIA.   - ASA 325 mg one  - Simvastatin 20 mg daily  - 2D echo  - Cardiac Diet  - Hemoglobin A1c  - Neuro checks  - NIH Stroke Scale  - PT/OT/SLP   - Telemetry  - Permissive HTN  2.) Constipation: Patient has not had a BM for 8 days and is having symptoms of constipation.    - Soap suds enema today  3.) Mitral Stenosis: TEE and 2D echo results as above. TEE reassuring that there were no vegetations on mitral leaflets.    - Continue telemetry   - Hold metoprolol and diltiazem to allow for permissive hypertension   - F/u scheduled with Dr. Wylie Hail  4.) HTN: BP has been 140s/80s. Patient is normally on diltiazem 240 mg daily, lisinopril-HCTZ 20-12.5 mg daily and metoprolol 25 mg daily at home.   - Hold meds for permissive HTN  5.) L TKA: Patient working with PT. Ortho is following. Ortho okay with whatever we give for DVT PPx. Chest guidelines recommend lovenox for 14-35 days post-op.   - PT/OT   - CPM   - Compression Stockings   - Plavix 75 mg daily   - Heparin TID   - Discuss LMWH  6.) HLD: Recent lipid panel shows cholesterol of 129, TG 105, HDL 46, LDL 62.    - Simvastatin 20 mg daily   7.) DVT/PE ppx   - Heparin SQ TID   Dispo: Patient is to be discharged home with her daughter today. She will be set up with home health.  The patient does have a current PCP Samson Frederic, MD) and does need an [facility] hospital follow-up appointment after discharge.  The patient does not have transportation limitations that hinder transportation to clinic appointments.  .Services Needed at time of discharge: Y = Yes, Blank = No PT:   OT:   RN:   Equipment:   Other:     LOS: 2 days   Charlotta Newton, Med Student 11/02/2014, 12:45 PM

## 2014-11-02 NOTE — Progress Notes (Signed)
Occupational Therapy Treatment Patient Details Name: [name] MRN: [id number] DOB: 1934-07-24 Today's Date: 11/02/2014    History of present illness 80 y.o. female admitted 10/31/14 with speech difficulty. + Rt corona radiata on MRI.  Marland Kitchen Pt with significant PMHx 10/26/14 L TKA, R TKA, HTN, and mitral valve repair.   OT comments  Pt educated and practiced with red theraputty for L hand exercises in today's session. Pt given home exercise plan handout for L hand exercises. Pt with no further acute OT needs identified. All education has been completed and the patient has no further questions. See below for any follow-up Occupational Therapy or equipment needs. OT to sign off. Thank you for referral.    Follow Up Recommendations  No OT follow up    Equipment Recommendations  None recommended by OT    Recommendations for Other Services      Precautions / Restrictions Precautions Precautions: Fall;Knee Restrictions Weight Bearing Restrictions: Yes LLE Weight Bearing: Weight bearing as tolerated       Mobility Bed Mobility               General bed mobility comments: Pt in recliner on OT arrival  Transfers                      Balance                                   ADL                                         General ADL Comments: Pt given Hand & Digit Thera-putty Exercises Handout and red thera-putty. Pt educated on and practiced each exercise with thera-putty multiple times.      Vision                     Perception     Praxis      Cognition   Behavior During Therapy: WFL for tasks assessed/performed Overall Cognitive Status: Within Functional Limits for tasks assessed       Memory: Decreased short-term memory               Extremity/Trunk Assessment               Exercises Other Exercises Other Exercises: Finger hook (x3) and full grip (x3) with red theraputty Other Exercises:  Finger extension x1/each; finger spread (x2) digit with red theraputty Other Exercises: Thumb press (x3), thumb adduction (x3), thumb extension (x4), thumb pinch strengthening (x2) with red theraputty Other Exercises: Finger scissor x3 (between index-middle, middle-fourth, fourth-fifth digits) with red theraputty Other Exercises: Scissor spread (x3), three jaw chuck pinch (x2) with red theraputty   Shoulder Instructions       General Comments      Pertinent Vitals/ Pain       Pain Assessment: No/denies pain  Home Living                                          Prior Functioning/Environment              Frequency       Progress Toward Goals  OT Goals(current goals can  now be found in the care plan section)  Progress towards OT goals: Goals met/education completed, patient discharged from OT  Acute Rehab OT Goals Patient Stated Goal: to make L hand work better OT Goal Formulation: With patient/family Time For Goal Achievement: 11/15/14 Potential to Achieve Goals: Good ADL Goals Pt/caregiver will Perform Home Exercise Program: Left upper extremity;With theraputty;With written HEP provided;With Supervision  Plan All goals met and education completed, patient discharged from OT services    Co-evaluation                 End of Session Equipment Utilized During Treatment: Other (comment) (theraputty)   Activity Tolerance Patient tolerated treatment well   Patient Left in chair;with call bell/phone within reach;with chair alarm set;with family/visitor present   Nurse Communication Mobility status;Precautions        Time: 9155-0271 OT Time Calculation (min): 24 min  Charges: OT General Charges $OT Visit: 1 Procedure OT Treatments $Therapeutic Exercise: 23-37 mins  Redmond Baseman 11/02/2014, 2:25 PM

## 2014-11-02 NOTE — Discharge Summary (Signed)
Name: [name] MRN: [id number] DOB: 1934/09/21 80 y.o. PCP: Samson Frederic, MD  Date of Admission: 10/31/2014 10:14 AM Date of Discharge: 11/03/2014 Attending Physician: Dr. Meredith Pel  Discharge Diagnosis:  Active Problems:   CVA (cerebral infarction)   Acute ischemic stroke   Elevated troponin   Absolute anemia   Cerebral infarction due to unspecified mechanism  Discharge Medications:   Medication List    STOP taking these medications        AFRIN NASAL SPRAY NA     aspirin EC 325 MG tablet      TAKE these medications        acetaminophen 650 MG CR tablet  Commonly known as:  TYLENOL  Take 650 mg by mouth daily as needed for pain.     clopidogrel 75 MG tablet  Commonly known as:  PLAVIX  Take 1 tablet (75 mg total) by mouth daily.     dicyclomine 10 MG capsule  Commonly known as:  BENTYL  Take 10 mg by mouth daily before lunch.     diltiazem 240 MG 24 hr capsule  Commonly known as:  DILACOR XR  Take 240 mg by mouth daily.     enoxaparin 40 MG/0.4ML injection  Commonly known as:  LOVENOX  Inject 0.4 mLs (40 mg total) into the skin daily.     HYDROcodone-acetaminophen 5-325 MG per tablet  Commonly known as:  NORCO  Take 1-2 tablets by mouth every 6 (six) hours as needed for moderate pain.     lisinopril-hydrochlorothiazide 20-12.5 MG per tablet  Commonly known as:  PRINZIDE,ZESTORETIC  Take 1 tablet by mouth daily.     methocarbamol 500 MG tablet  Commonly known as:  ROBAXIN  Take 1 tablet (500 mg total) by mouth every 8 (eight) hours as needed for muscle spasms.     metoprolol tartrate 25 MG tablet  Commonly known as:  LOPRESSOR  Take 25 mg by mouth daily.     omeprazole 20 MG capsule  Commonly known as:  PRILOSEC  Take 20 mg by mouth daily.     potassium chloride 10 MEQ tablet  Commonly known as:  K-DUR  Take 1 tablet by mouth daily.     simvastatin 20 MG tablet  Commonly known as:  ZOCOR  Take 20 mg by mouth daily.     SYSTANE OP    Place 2 drops into both eyes daily.     VITAMIN C PO  Take 1 tablet by mouth daily.        Disposition and follow-up:   Ms.[name] was discharged from [facility] in Good condition.  At the hospital follow up visit please address:  1.  CVA: Please address resolution of hand weakness and numbness. Please ensure compliance with Plavix 75 mg daily.  L Knee Replacement: Please ensure compliance with Lovenox 40 mg daily. Patient will stop taking this  On 11/23/2014.   Constipation: Please ask patient if she has been having regular bowel movements.   2.  Labs / imaging needed at time of follow-up: None  3.  Pending labs/ test needing follow-up: None  Follow-up Appointments: Follow-up Information    Follow up with Ricki Rodriguez, MD On 11/16/2014.   Specialty:  Cardiology   Why:  11:00 am   Contact information:   [address] [place] [address] [phone]       Follow up with Harvie Junior, MD.   Specialty:  Orthopedic Surgery  Why:  for appointment for follow up   Contact information:   [address] [place] [address] [phone]       Follow up with [facility].   Why:  for home health care services   Contact information:   [address] [place] [address] [phone]       Follow up with SETHI,PRAMOD, MD On 01/04/2015.   Specialties:  Neurology, Radiology   Why:  8:45 am   Contact information:   [address] [place] [address] [phone]       Discharge Instructions: Discharge Instructions    Diet - low sodium heart healthy    Complete by:  As directed      Increase activity slowly    Complete by:  As directed            Consultations: Treatment Team:  Md Stroke, MD  Procedures Performed:  Ct Head (brain) Wo Contrast  10/31/2014   CLINICAL DATA:  Code stroke.  Left hand numbness.  EXAM: CT HEAD WITHOUT CONTRAST  TECHNIQUE: Contiguous axial images were obtained from  the base of the skull through the vertex without intravenous contrast.  COMPARISON:  None.  FINDINGS: Focal area of low attenuation within the left basal ganglia compatible with subacute to chronic lacunar infarct. There is mild patchy low attenuation within the periventricular and subcortical white matter compatible with chronic microvascular disease. The mastoid air cells and the paranasal sinuses are clear. The calvarium appears intact.  IMPRESSION: 1. No acute intracranial abnormalities. 2. Chronic small vessel ischemic disease.   Electronically Signed   By: Signa Kell M.D.   On: 10/31/2014 10:37   Mr Brain Wo Contrast  10/31/2014   CLINICAL DATA:  Initial evaluation for acute onset left facial droop, decreased sensation and left hand  EXAM: MRI HEAD WITHOUT CONTRAST  MRA HEAD WITHOUT CONTRAST  TECHNIQUE: Multiplanar, multiecho pulse sequences of the brain and surrounding structures were obtained without intravenous contrast. Angiographic images of the head were obtained using MRA technique without contrast.  COMPARISON:  Prior noncontrast head CT from earlier the same day.  FINDINGS: MRI HEAD FINDINGS  There is a subtle curvilinear focus of restricted diffusion within the cortex of the posterior or right frontal lobe (series 4, image 22), of compatible with acute ischemic infarct. No significant mass effect or associated hemorrhage. No other acute ischemic infarct identified. Gray-white matter differentiation otherwise maintained. Normal flow voids seen within the intracranial vasculature.  Mild diffuse prominence of the CSF containing spaces is compatible with generalized age-related cerebral atrophy. Patchy T2/FLAIR hyperintensities within the periventricular deep white matter both cerebral hemispheres is present, nonspecific, but likely related to moderate chronic small vessel ischemic changes. Subcentimeter hypo intense focus within the posterior left frontal lobe on gradient echo sequence likely  reflects a small chronic microhemorrhage. Probable additional small microhemorrhage present within the right thalamus.  No mass lesion or midline shift. Ventricles are normal in size without evidence of hydrocephalus. No extra-axial fluid collection.  Pituitary gland within normal limits. Craniocervical junction unremarkable. No acute abnormality seen about the orbits.  Mild mucosal thickening present within the inferior left maxillary sinus. Paranasal sinuses are otherwise clear. No mastoid effusion.  Bone marrow signal intensity within normal limits. Visualized upper cervical spine unremarkable.  MRA HEAD FINDINGS  ANTERIOR CIRCULATION:  Visualized distal cervical segments of the internal carotid arteries are widely patent with antegrade flow. The petrous, cavernous, and clinoid segments are widely patent without  hemodynamically significant stenosis. Mild irregularity within the cavernous right ICA likely related to underlying atheromatous plaque. A1 segments, anterior communicating artery, and anterior cerebral arteries are within normal limits.  M1 segments are widely patent bilaterally without proximal branch occlusion or hemodynamically significant stenosis. Distal MCA branches well opacified bilaterally.  POSTERIOR CIRCULATION:  Vertebral arteries are codominant and widely patent bilaterally. Partially visualized posterior inferior cerebral arteries patent. Vertebrobasilar junction is within normal limits. The basilar artery itself is mildly tortuous but widely patent. Posterior cerebral arteries and superior cerebral arteries are patent bilaterally.  No convincing evidence for aneurysm or vascular malformation.  IMPRESSION: MRI HEAD IMPRESSION:  1. Small subtle cortical ischemic infarct involving the posterior right frontal lobe as above. No significant mass effect or associated hemorrhage. 2. Atrophy with moderate chronic small vessel ischemic disease.  MRA HEAD IMPRESSION:  No proximal branch occlusion or  hemodynamically significant stenosis identified within the intracranial circulation.   Electronically Signed   By: Rise MuBenjamin  McClintock M.D.   On: 10/31/2014 22:23   Mr Maxine GlennMra Head/brain Wo Cm  10/31/2014   CLINICAL DATA:  Initial evaluation for acute onset left facial droop, decreased sensation and left hand  EXAM: MRI HEAD WITHOUT CONTRAST  MRA HEAD WITHOUT CONTRAST  TECHNIQUE: Multiplanar, multiecho pulse sequences of the brain and surrounding structures were obtained without intravenous contrast. Angiographic images of the head were obtained using MRA technique without contrast.  COMPARISON:  Prior noncontrast head CT from earlier the same day.  FINDINGS: MRI HEAD FINDINGS  There is a subtle curvilinear focus of restricted diffusion within the cortex of the posterior or right frontal lobe (series 4, image 22), of compatible with acute ischemic infarct. No significant mass effect or associated hemorrhage. No other acute ischemic infarct identified. Gray-white matter differentiation otherwise maintained. Normal flow voids seen within the intracranial vasculature.  Mild diffuse prominence of the CSF containing spaces is compatible with generalized age-related cerebral atrophy. Patchy T2/FLAIR hyperintensities within the periventricular deep white matter both cerebral hemispheres is present, nonspecific, but likely related to moderate chronic small vessel ischemic changes. Subcentimeter hypo intense focus within the posterior left frontal lobe on gradient echo sequence likely reflects a small chronic microhemorrhage. Probable additional small microhemorrhage present within the right thalamus.  No mass lesion or midline shift. Ventricles are normal in size without evidence of hydrocephalus. No extra-axial fluid collection.  Pituitary gland within normal limits. Craniocervical junction unremarkable. No acute abnormality seen about the orbits.  Mild mucosal thickening present within the inferior left maxillary sinus.  Paranasal sinuses are otherwise clear. No mastoid effusion.  Bone marrow signal intensity within normal limits. Visualized upper cervical spine unremarkable.  MRA HEAD FINDINGS  ANTERIOR CIRCULATION:  Visualized distal cervical segments of the internal carotid arteries are widely patent with antegrade flow. The petrous, cavernous, and clinoid segments are widely patent without hemodynamically significant stenosis. Mild irregularity within the cavernous right ICA likely related to underlying atheromatous plaque. A1 segments, anterior communicating artery, and anterior cerebral arteries are within normal limits.  M1 segments are widely patent bilaterally without proximal branch occlusion or hemodynamically significant stenosis. Distal MCA branches well opacified bilaterally.  POSTERIOR CIRCULATION:  Vertebral arteries are codominant and widely patent bilaterally. Partially visualized posterior inferior cerebral arteries patent. Vertebrobasilar junction is within normal limits. The basilar artery itself is mildly tortuous but widely patent. Posterior cerebral arteries and superior cerebral arteries are patent bilaterally.  No convincing evidence for aneurysm or vascular malformation.  IMPRESSION: MRI HEAD IMPRESSION:  1. Small subtle  cortical ischemic infarct involving the posterior right frontal lobe as above. No significant mass effect or associated hemorrhage. 2. Atrophy with moderate chronic small vessel ischemic disease.  MRA HEAD IMPRESSION:  No proximal branch occlusion or hemodynamically significant stenosis identified within the intracranial circulation.   Electronically Signed   By: Rise Mu M.D.   On: 10/31/2014 22:23   Admission HPI: Mrs. Woodrow is a 80 yo female with PMHx of HTN, HLD, OA s/p R knee replacement in June 2015 and L knee replacement on 10/26/14 who presented to the ED with complaints of left hand weakness and numbness. Patient stated she awoke around 0200 this morning and got up to  use the bathroom. She noticed it was hard for her to get the toilet paper. She then noticed left hand weakness and numbness when she tried to pull up her blankets in bed. She later spilled water on the floor since her grip was poor. Patient stayed in bed until her daughter was leaving for work. The daughter noted the left hand weakness and without improvement decided to call EMS to bring her mother into the ED. Patient states her strength and numbness are improved from prior, but she continues to have residual weakness in left hand. Patient has no history of prior stroke or MI. She was a previous smoker but quit 5 years ago. She denies alcohol or illicit drug use. Patient denies all other symptoms. She denies hearing or vision loss, trouble speaking or swallowing, weakness or numbness in any other extremity. Denies dizziness, headache, chest pain, shortness of breath, nausea, vomiting or diarrhea.   Hospital Course by problem list: Active Problems:   CVA (cerebral infarction)   Acute ischemic stroke   Elevated troponin   Absolute anemia   Cerebral infarction due to unspecified mechanism   Acute CVA: Patient presented with new onset left hand weakness and numbness. CT head showed no acute intracranial abnormalities and chronic small vessel ischemic disease. Patient was out of the tPA window due to time and due to recent left knee total replacement. MRI/MRA showed mall subtle cortical ischemic infarct involving the posterior right frontal lobe as above. There was no significant mass effect or associated hemorrhage and there was atrophy with moderate chronic small vessel ischemic disease. There was no proximal branch occlusion or hemodynamically significant stenosis identified within the intracranial circulation. Carotid dopplers showed 1-39% stenosis bilaterally. Echo showed 55-60% EF, moderate aortic regurgitation, Mitral valve leaflet appears very thickened, suggestive of myxomatous MV or vegetation which  could be secondary to MV replacement/repair. TEE was then performed which showed mitral valve s/p MV repair. There was restrictive opening of both the anterior and posterior leaflets. The anterior leaflet appears to be more restricted than the posterior leaflet. Moderate - severe mitral stenosis, but there is no evidence of vegetation. Patient was changed from aspirin to plavix 75 mg daily. Patient was instructed to discontinue home aspirin. Patient was discharge with this prescription, HH with PT and RN. Patient will follow up with Dr. Pearlean Brownie and Dr. Algie Coffer.  MVR s/p Valve Repair: Patient is on diltiazem 240 mg daily, metoprolol 25 mg daily at home.On TTE, mitral valve leaflet appears very thickened, suggestive of myxomatous MV or vegetation which could be secondary to MV replacement/repair. However, TEE showed restrictive opening of both the anterior and posterior leaflet, anterior leaflet more restricted than the posterior leaflet and moderate - severe mitral stenosis, but there is no evidence of vegetation. Patient was discharged home with plavix 75 mg  daily and her home medications of diltiazem, lisinopril-HCTZ, and metoprolol. Patient will follow up with Dr. Algie CofferKadakia as outpatient.  L Knee Replacement: Patient had a recent L total knee replacement on 10/26/14. Patient worked well with physical therapy and we continued her continuous passive motion machine, compression stocking and was heparin TID for DVT prophylaxis. Upon discussion with Neurology and Pharmacy, the best outpatient medicine for DVT and PE prophylaxis after a total knee replacement and in the setting of recent acute CVA and given the patient's characteristics, we decided to place her on lovenox 40 mg SQ daily for 3 more weeks. Patient and family were in agreement with this.  HTN: BP was moderately elevated. Patient is normally on diltiazem 240 mg daily, lisinopril-HCTZ 20-12.5 mg daily and metoprolol 25 mg daily at home. We held these  medications for permissive hypertension and resumed them on discharge.  HLD: Most recent lipid panel on 08/13/2007 showed cholesterol of 130, TG 74, HDL 65, LDL 50. Patient is on simvastatin 20 mg daily. Recent lipid panel shows cholesterol of 129, TG 105, HDL 46, LDL 62. We continued simvastatin 20 mg daily during admission and on discharge.   Discharge Vitals:   BP 143/83 mmHg  Pulse 99  Temp(Src) 98.9 F (37.2 C) (Oral)  Resp 20  Ht 4\' 11"  (1.499 m)  Wt 61.236 kg (135 lb)  BMI 27.25 kg/m2  SpO2 98%  Signed: Jill AlexandersAlexa Richardson, DO PGY-1 Internal Medicine Resident Pager # [phone] 11/03/2014 5:53 PM

## 2014-11-02 NOTE — Progress Notes (Signed)
Internal Medicine Attending  Date: 11/02/2014  Patient name: [name] Medical record number: [id number] Date of birth: 09-24-1934 Age: 80 y.o. Gender: female  I saw and evaluated the patient, and discussed her care with resident on A.M rounds.  I reviewed the resident's note by Dr. Senaida Oresichardson and I agree with the resident's findings and plans as documented in her note, with the following additional comments.  Patient reports constipation but is otherwise doing well with no acute complaints; she still has some weakness and numbness of her left hand.  TEE today showed moderate - severe mitral stenosis with no evidence of vegetation, moderate pulmonary hypertension ( RV/RA gradient of 61, PA pressure of ~ 71 mm Hg), and well preserved LV function.  Her stroke prophylaxis was changed from aspirin to clopidogrel since her current stroke occurred on twice a day aspirin.  Patient is one-week status post left total knee replacement, so DVT prophylaxis is an issue.  The Chest guidelines recommend a minimum of 10-14 days of anti-thrombotic prophylaxis and suggests extending prophylaxis in the outpatient period for up to 35 days, with LMWH age as he preferred agent although other options are acceptable.  We discussed this with patient and family, and the plan is to continue DVT prophylaxis with low molecular weight heparin at home if this can be arranged; if not, then reduced dose rivaroxaban would be an alternative.  Patient has a history of mitral valve repair for mitral stenosis, and given this along with the echocardiogram findings today, she needs elective follow-up with her cardiologist.

## 2014-11-02 NOTE — Progress Notes (Signed)
D/C orders received. Pt and family educated on d/c instructions and stroke education. Verbalized understanding. Pt handed d/c packet. IV and tele removed. Pt taken downstairs by staff via wheelchair.

## 2014-11-02 NOTE — Interval H&P Note (Signed)
History and Physical Interval Note:  11/02/2014 8:54 AM  Janet GrumblesSusan A Labate  has presented today for surgery, with the diagnosis of STROKE VEGATATION ?  The various methods of treatment have been discussed with the patient and family. After consideration of risks, benefits and other options for treatment, the patient has consented to  Procedure(s): TRANSESOPHAGEAL ECHOCARDIOGRAM (TEE) (N/A) as a surgical intervention .  The patient's history has been reviewed, patient examined, no change in status, stable for surgery.  I have reviewed the patient's chart and labs.  Questions were answered to the patient's satisfaction.     Elyn AquasPhilip Nahser Jr.

## 2014-11-02 NOTE — Plan of Care (Signed)
Problem: Discharge/Transitional Outcomes Goal: Independent mobility/functioning independent or with min Independent mobility/functioning independently or with minimal assistance  Outcome: Progressing     

## 2014-11-02 NOTE — Progress Notes (Signed)
STROKE TEAM PROGRESS NOTE   HISTORY [name] is an 80 y.o. female who was awake at 0200 hours 10/31/2014 when daughter noted left facial droop and patient stated she had decreased sensation in her left hand and weakness in her left hand. EMS was called and patient was brought to ED as code stroke. On arrival she was 8 hours out and not a tPA or intervention candidate. Currently her only complaint is slight decreased sensation on the left face and left hand. Initially it was though that patient was in new onset Afib but after looking closely at rhythm strip it is NSR. NIHSS 4. Patient was not administered TPA secondary to delay in arrival. She was admitted for further evaluation and treatment.   SUBJECTIVE (INTERVAL HISTORY) Her daughter is at the bedside.  Overall she feels her condition is stable and she is ready for discharge.    OBJECTIVE Temp:  [98.7 F (37.1 C)-99.6 F (37.6 C)] 98.9 F (37.2 C) (11/18 1100) Pulse Rate:  [99-117] 99 (11/18 1100) Cardiac Rhythm:  [-] Sinus tachycardia (11/18 0752) Resp:  [16-26] 20 (11/18 1100) BP: (136-178)/(75-101) 143/83 mmHg (11/18 1100) SpO2:  [97 %-100 %] 98 % (11/18 1100)   Recent Labs Lab 10/31/14 1102  GLUCAP 90    Recent Labs Lab 10/27/14 0500 10/28/14 0409 10/31/14 1038 10/31/14 1046 11/01/14 0743 11/02/14 0740  NA 141 137 135* 135* 136* 133*  K 3.3* 3.2* 3.0* 2.8* 4.1 4.4  CL 101 97 96 96 99 97  CO2 23 22 25   --  24 23  GLUCOSE 106* 112* 105* 105* 101* 96  BUN 11 13 16 14 10 9   CREATININE 0.90 0.85 0.79 0.80 0.72 0.75  CALCIUM 8.0* 8.3* 8.8  --  8.7 8.8    Recent Labs Lab 10/31/14 1038  AST 40*  ALT 15  ALKPHOS 83  BILITOT 0.7  PROT 6.4  ALBUMIN 2.7*    Recent Labs Lab 10/27/14 0500 10/28/14 0409 10/31/14 1038 10/31/14 1046  WBC 7.3 11.4* 5.8  --   NEUTROABS  --   --  3.9  --   HGB 9.2* 8.6* 8.2* 8.8*  HCT 28.0* 26.0* 25.3* 26.0*  MCV 80.0 79.8 81.4  --   PLT 169 146* 231  --     Recent  Labs Lab 10/31/14 2143 11/01/14 0129 11/01/14 0743 11/01/14 1359 11/01/14 2003  TROPONINI 0.69* 0.66* 0.38* 0.31* <0.30    Recent Labs  10/31/14 1038  LABPROT 15.3*  INR 1.19   No results for input(s): COLORURINE, LABSPEC, PHURINE, GLUCOSEU, HGBUR, BILIRUBINUR, KETONESUR, PROTEINUR, UROBILINOGEN, NITRITE, LEUKOCYTESUR in the last 72 hours.  Invalid input(s): APPERANCEUR     Component Value Date/Time   CHOL 129 11/01/2014 0129   TRIG 105 11/01/2014 0129   HDL 46 11/01/2014 0129   CHOLHDL 2.8 11/01/2014 0129   VLDL 21 11/01/2014 0129   LDLCALC 62 11/01/2014 0129   Lab Results  Component Value Date   HGBA1C 5.8* 11/01/2014   No results found for: LABOPIA, COCAINSCRNUR, LABBENZ, AMPHETMU, THCU, LABBARB  No results for input(s): ETH in the last 168 hours.  Mr Brain Wo Contrast  10/31/2014   CLINICAL DATA:  Initial evaluation for acute onset left facial droop, decreased sensation and left hand  EXAM: MRI HEAD WITHOUT CONTRAST  MRA HEAD WITHOUT CONTRAST  TECHNIQUE: Multiplanar, multiecho pulse sequences of the brain and surrounding structures were obtained without intravenous contrast. Angiographic images of the head were obtained using MRA technique without contrast.  COMPARISON:  Prior noncontrast head CT from earlier the same day.  FINDINGS: MRI HEAD FINDINGS  There is a subtle curvilinear focus of restricted diffusion within the cortex of the posterior or right frontal lobe (series 4, image 22), of compatible with acute ischemic infarct. No significant mass effect or associated hemorrhage. No other acute ischemic infarct identified. Gray-white matter differentiation otherwise maintained. Normal flow voids seen within the intracranial vasculature.  Mild diffuse prominence of the CSF containing spaces is compatible with generalized age-related cerebral atrophy. Patchy T2/FLAIR hyperintensities within the periventricular deep white matter both cerebral hemispheres is present,  nonspecific, but likely related to moderate chronic small vessel ischemic changes. Subcentimeter hypo intense focus within the posterior left frontal lobe on gradient echo sequence likely reflects a small chronic microhemorrhage. Probable additional small microhemorrhage present within the right thalamus.  No mass lesion or midline shift. Ventricles are normal in size without evidence of hydrocephalus. No extra-axial fluid collection.  Pituitary gland within normal limits. Craniocervical junction unremarkable. No acute abnormality seen about the orbits.  Mild mucosal thickening present within the inferior left maxillary sinus. Paranasal sinuses are otherwise clear. No mastoid effusion.  Bone marrow signal intensity within normal limits. Visualized upper cervical spine unremarkable.  MRA HEAD FINDINGS  ANTERIOR CIRCULATION:  Visualized distal cervical segments of the internal carotid arteries are widely patent with antegrade flow. The petrous, cavernous, and clinoid segments are widely patent without hemodynamically significant stenosis. Mild irregularity within the cavernous right ICA likely related to underlying atheromatous plaque. A1 segments, anterior communicating artery, and anterior cerebral arteries are within normal limits.  M1 segments are widely patent bilaterally without proximal branch occlusion or hemodynamically significant stenosis. Distal MCA branches well opacified bilaterally.  POSTERIOR CIRCULATION:  Vertebral arteries are codominant and widely patent bilaterally. Partially visualized posterior inferior cerebral arteries patent. Vertebrobasilar junction is within normal limits. The basilar artery itself is mildly tortuous but widely patent. Posterior cerebral arteries and superior cerebral arteries are patent bilaterally.  No convincing evidence for aneurysm or vascular malformation.  IMPRESSION: MRI HEAD IMPRESSION:  1. Small subtle cortical ischemic infarct involving the posterior right frontal  lobe as above. No significant mass effect or associated hemorrhage. 2. Atrophy with moderate chronic small vessel ischemic disease.  MRA HEAD IMPRESSION:  No proximal branch occlusion or hemodynamically significant stenosis identified within the intracranial circulation.   Electronically Signed   By: Rise Mu M.D.   On: 10/31/2014 22:23   Mr Maxine Glenn Head/brain Wo Cm  10/31/2014   CLINICAL DATA:  Initial evaluation for acute onset left facial droop, decreased sensation and left hand  EXAM: MRI HEAD WITHOUT CONTRAST  MRA HEAD WITHOUT CONTRAST  TECHNIQUE: Multiplanar, multiecho pulse sequences of the brain and surrounding structures were obtained without intravenous contrast. Angiographic images of the head were obtained using MRA technique without contrast.  COMPARISON:  Prior noncontrast head CT from earlier the same day.  FINDINGS: MRI HEAD FINDINGS  There is a subtle curvilinear focus of restricted diffusion within the cortex of the posterior or right frontal lobe (series 4, image 22), of compatible with acute ischemic infarct. No significant mass effect or associated hemorrhage. No other acute ischemic infarct identified. Gray-white matter differentiation otherwise maintained. Normal flow voids seen within the intracranial vasculature.  Mild diffuse prominence of the CSF containing spaces is compatible with generalized age-related cerebral atrophy. Patchy T2/FLAIR hyperintensities within the periventricular deep white matter both cerebral hemispheres is present, nonspecific, but likely related to moderate chronic small vessel ischemic changes.  Subcentimeter hypo intense focus within the posterior left frontal lobe on gradient echo sequence likely reflects a small chronic microhemorrhage. Probable additional small microhemorrhage present within the right thalamus.  No mass lesion or midline shift. Ventricles are normal in size without evidence of hydrocephalus. No extra-axial fluid collection.   Pituitary gland within normal limits. Craniocervical junction unremarkable. No acute abnormality seen about the orbits.  Mild mucosal thickening present within the inferior left maxillary sinus. Paranasal sinuses are otherwise clear. No mastoid effusion.  Bone marrow signal intensity within normal limits. Visualized upper cervical spine unremarkable.  MRA HEAD FINDINGS  ANTERIOR CIRCULATION:  Visualized distal cervical segments of the internal carotid arteries are widely patent with antegrade flow. The petrous, cavernous, and clinoid segments are widely patent without hemodynamically significant stenosis. Mild irregularity within the cavernous right ICA likely related to underlying atheromatous plaque. A1 segments, anterior communicating artery, and anterior cerebral arteries are within normal limits.  M1 segments are widely patent bilaterally without proximal branch occlusion or hemodynamically significant stenosis. Distal MCA branches well opacified bilaterally.  POSTERIOR CIRCULATION:  Vertebral arteries are codominant and widely patent bilaterally. Partially visualized posterior inferior cerebral arteries patent. Vertebrobasilar junction is within normal limits. The basilar artery itself is mildly tortuous but widely patent. Posterior cerebral arteries and superior cerebral arteries are patent bilaterally.  No convincing evidence for aneurysm or vascular malformation.  IMPRESSION: MRI HEAD IMPRESSION:  1. Small subtle cortical ischemic infarct involving the posterior right frontal lobe as above. No significant mass effect or associated hemorrhage. 2. Atrophy with moderate chronic small vessel ischemic disease.  MRA HEAD IMPRESSION:  No proximal branch occlusion or hemodynamically significant stenosis identified within the intracranial circulation.   Electronically Signed   By: Rise MuBenjamin  McClintock M.D.   On: 10/31/2014 22:23   Carotid Doppler  There is 1-39% bilateral ICA stenosis. Vertebral artery flow is  antegrade.    PHYSICAL EXAM Pleasant elderly african american lady not in distress.Awake alert. Afebrile. Head is nontraumatic. Neck is supple without bruit. Hearing is normal. Cardiac exam no murmur or gallop. Lungs are clear to auscultation. Distal pulses are well felt. Neurological Exam : Awake alert oriented x 3 normal speech and language. Mild left lower face asymmetry. Tongue midline. No drift. Mild diminished fine finger movements on left. Orbits right over left upper extremity. Mild left grip weak.. Normal sensation except hyperesthesia left hand finger tips . Normal coordination. ASSESSMENT/PLAN [name] is a 80 y.o. female with history of hypertension, hyperlipidemia, s/p mitral valve repair in 2010, s/p bilateral knee replacements (left on 10/26/2014) and peptic ulcer disease presenting with decreased sensation and weakness in her L hand. She did not receive IV t-PA due to delay in arrival.   Stroke:  Non-dominant right posterior frontal lobe infarct secondary to small vessel disease source  Resultant  Left arm hemisensory deficit  MRI  Small right poster frontal lobe infarct  MRA  No significant stenosis   Carotid Doppler  No significant stenosis   2D Echo  Left ventricle: The cavity size was normal. There was mild concentric hypertrophy. Systolic function was normal. The estimated ejection fraction was in the range of 55% to 60%. Wall motion was normal; there were no regional wall motion abnormalities.  HgbA1c 5.8  Heparin 5000 units sq tid for VTE prophylaxis Diet Heart  Diet - low sodium heart healthy thin liquids  aspirin 325 mg orally every day prior to admission, now on aspirin 325 mg orally every day. Recommend change to  plavix 75 mg daily given stroke while on aspirin. Order written.  Patient counseled to be compliant with her antithrombotic medications  Ongoing aggressive risk factor management  Therapy recommendations:  pending    Disposition:  Anticipate return home  Hypertension  Stable  Hyperlipidemia  Home meds:  Zocor 20 mg daily resumed in hospital  LDL 62, at goal < 70  Continue statin at discharge  Other Stroke Risk Factors  Advanced age  Former Cigarette smoker  Hospital day # 2  Annie MainSHARON BIBY, MSN, APRN, ANVP-BC, AGPCNP-BC [facility] Pager: [phone] 11/02/2014 3:30 PM   I have personally examined this patient, reviewed notes, independently viewed imaging studies, participated in medical decision making and plan of care. I have made any additions or clarifications directly to the above note. Agree with note above. She has small lacunar infarct secondary to small vessel disease. Dc home today and f/u as outpatient in 4 weeks.Discussed with daughter and patient and answered questions  Delia HeadyPramod Renwick Asman, MD Medical Director [facility] Pager: [phone] 11/02/2014 3:30 PM   To contact Stroke Continuity provider, please refer to [email or web address]. After hours, contact General Neurology

## 2014-11-02 NOTE — CV Procedure (Addendum)
    Transesophageal Echocardiogram Note  Janet GrumblesSusan A Gergen [id number] 1934/10/14  Procedure: Transesophageal Echocardiogram Indications: CVA, MV repair  Procedure Details Consent: Obtained Time Out: Verified patient identification, verified procedure, site/side was marked, verified correct patient position, special equipment/implants available, Radiology Safety Procedures followed,  medications/allergies/relevent history reviewed, required imaging and test results available.  Performed  Medications: Fentanyl: 25 mcg iv  Versed: 3 mg IV   Left Ventrical:  Normal LV function  Mitral Valve: she is s/p MV repair.  There is restrictive opening of both the anterior and posterior leaflets.  The anterior leaflet appears to be more restricted than the posterior leaflet.  Moderate - severe mitral stenosis. There is no evidence of vegetation.  Aortic Valve: normal 3 leaflet valve  Tricuspid Valve: moderate TR, RV/RA gradient of 61 mm Hg  Pulmonic Valve: mild PI  Left Atrium/ Left atrial appendage: no thrombus, + smoke ( spontaneous contrast)   Atrial septum: intact by color and bubble study  Aorta: no significant atheroma   Complications: No apparent complications Patient did tolerate procedure well.  Impression:  Moderate - severe mitral stenosis. Moderate pulmonary hypertension - RV/RA gradient of 61 ~ PA pressure of ~ 71 mm Hg Well preserved LV function.   Vesta MixerPhilip J. Phillp Dolores, Montez HagemanJr., MD, [facility] 11/02/2014, 9:45 AM

## 2014-11-02 NOTE — Progress Notes (Signed)
[facility] as prior to hospital admission; Mary with [facility] is aware of admission and [facility] orders for resumption of services at discharge; Alexis GoodellB Aayan Haskew RN,BSN,MHA [phone]

## 2014-11-02 NOTE — Discharge Instructions (Signed)
Thank you for allowing [place] to be involved in your healthcare while you were hospitalized at [facility].   Please note that there have been changes to your home medications.  --> PLEASE LOOK AT YOUR DISCHARGE MEDICATION LIST FOR DETAILS.   Please call your PCP if you have any questions or concerns, or any difficulty getting any of your medications.  Please return to the ER if you have worsening of your symptoms or new severe symptoms arise.   Please STOP taking Aspirin. Please START taking plavix 75 mg daily.  Please START taking lovenox 40 mg injections ONCE a day. You will do this for 3 more weeks and then stop.  Ischemic Stroke A stroke (cerebrovascular accident) is the sudden death of brain tissue. It is a medical emergency. A stroke can cause permanent loss of brain function. This can cause problems with different parts of your body. A transient ischemic attack (TIA) is different because it does not cause permanent damage. A TIA is a short-lived problem of poor blood flow affecting a part of the brain. A TIA is also a serious problem because having a TIA greatly increases the chances of having a stroke. When symptoms first develop, you cannot know if the problem might be a stroke or a TIA. CAUSES  A stroke is caused by a decrease of oxygen supply to an area of your brain. It is usually the result of a small blood clot or collection of cholesterol or fat (plaque) that blocks blood flow in the brain. A stroke can also be caused by blocked or damaged carotid arteries.  RISK FACTORS  High blood pressure (hypertension).  High cholesterol.  Diabetes mellitus.  Heart disease.  The buildup of plaque in the blood vessels (peripheral artery disease or atherosclerosis).  The buildup of plaque in the blood vessels providing blood and oxygen to the brain (carotid artery stenosis).  An abnormal heart rhythm (atrial fibrillation).  Obesity.  Smoking.  Taking oral  contraceptives (especially in combination with smoking).  Physical inactivity.  A diet high in fats, salt (sodium), and calories.  Alcohol use.  Use of illegal drugs (especially cocaine and methamphetamine).  Being African American.  Being over the age of 64.  Family history of stroke.  Previous history of blood clots, stroke, TIA, or heart attack.  Sickle cell disease. SYMPTOMS  These symptoms usually develop suddenly, or may be newly present upon awakening from sleep:  Sudden weakness or numbness of the face, arm, or leg, especially on one side of the body.  Sudden trouble walking or difficulty moving arms or legs.  Sudden confusion.  Sudden personality changes.  Trouble speaking (aphasia) or understanding.  Difficulty swallowing.  Sudden trouble seeing in one or both eyes.  Double vision.  Dizziness.  Loss of balance or coordination.  Sudden severe headache with no known cause.  Trouble reading or writing. DIAGNOSIS  Your health care provider can often determine the presence or absence of a stroke based on your symptoms, history, and physical exam. Computed tomography (CT) of the brain is usually performed to confirm the stroke, determine causes, and determine stroke severity. Other tests may be done to find the cause of the stroke. These tests may include:  Electrocardiography.  Continuous heart monitoring.  Echocardiography.  Carotid ultrasonography.  Magnetic resonance imaging (MRI).  A scan of the brain circulation.  Blood tests. PREVENTION  The risk of a stroke can be decreased by appropriately treating high blood pressure, high cholesterol,  diabetes, heart disease, and obesity and by quitting smoking, limiting alcohol, and staying physically active. TREATMENT  Time is of the essence. It is important to seek treatment at the first sign of these symptoms because you may receive a medicine to dissolve the clot (thrombolytic) that cannot be given  if too much time has passed since your symptoms began. Even if you do not know when your symptoms began, get treatment as soon as possible as there are other treatment options available including oxygen, intravenous (IV) fluids, and medicines to thin the blood (anticoagulants). Treatment of stroke depends on the duration, severity, and cause of your symptoms. Medicines and dietary changes may be used to address diabetes, high blood pressure, and other risk factors. Physical, speech, and occupational therapists will assess you and work with you to improve any functions impaired by the stroke. Measures will be taken to prevent short-term and long-term complications, including infection from breathing foreign material into the lungs (aspiration pneumonia), blood clots in the legs, bedsores, and falls. Rarely, surgery may be needed to remove large blood clots or to open up blocked arteries. HOME CARE INSTRUCTIONS   Take medicines only as directed by your health care provider. Follow the directions carefully. Medicines may be used to control risk factors for a stroke. Be sure you understand all your medicine instructions.  You may be told to take a medicine to thin the blood, such as aspirin or the anticoagulant warfarin. Warfarin needs to be taken exactly as instructed.  Too much and too little warfarin are both dangerous. Too much warfarin increases the risk of bleeding. Too little warfarin continues to allow the risk for blood clots. While taking warfarin, you will need to have regular blood tests to measure your blood clotting time. These blood tests usually include both the PT and INR tests. The PT and INR results allow your health care provider to adjust your dose of warfarin. The dose can change for many reasons. It is critically important that you take warfarin exactly as prescribed, and that you have your PT and INR levels drawn exactly as directed.  Many foods, especially foods high in vitamin K, can  interfere with warfarin and affect the PT and INR results. Foods high in vitamin K include spinach, kale, broccoli, cabbage, collard and turnip greens, brussels sprouts, peas, cauliflower, seaweed, and parsley, as well as beef and pork liver, green tea, and soybean oil. You should eat a consistent amount of foods high in vitamin K. Avoid major changes in your diet, or notify your health care provider before changing your diet. Arrange a visit with a dietitian to answer your questions.  Many medicines can interfere with warfarin and affect the PT and INR results. You must tell your health care provider about any and all medicines you take. This includes all vitamins and supplements. Be especially cautious with aspirin and anti-inflammatory medicines. Do not take or discontinue any prescribed or over-the-counter medicine except on the advice of your health care provider or pharmacist.  Warfarin can have side effects, such as excessive bruising or bleeding. You will need to hold pressure over cuts for longer than usual. Your health care provider or pharmacist will discuss other potential side effects.  Avoid sports or activities that may cause injury or bleeding.  Be mindful when shaving, flossing your teeth, or handling sharp objects.  Alcohol can change the body's ability to handle warfarin. It is best to avoid alcoholic drinks or consume only very small amounts while taking  warfarin. Notify your health care provider if you change your alcohol intake.  Notify your dentist or other health care providers before procedures.  If swallow studies have determined that your swallowing reflex is present, you should eat healthy foods. Including 5 or more servings of fruits and vegetables a day may reduce the risk of stroke. Foods may need to be a certain consistency (soft or pureed), or small bites may need to be taken in order to avoid aspirating or choking. Certain dietary changes may be advised to address high  blood pressure, high cholesterol, diabetes, or obesity.  Food choices that are low in sodium, saturated fat, trans fat, and cholesterol are recommended to manage high blood pressure.  Food choies that are high in fiber, and low in saturated fat, trans fat, and cholesterol may control cholesterol levels.  Controlling carbohydrates and sugar intake is recommended to manage diabetes.  Reducing calorie intake and making food choices that are low in sodium, saturated fat, trans fat, and cholesterol are recommended to manage obesity.  Maintain a healthy weight.  Stay physically active. It is recommended that you get at least 30 minutes of activity on all or most days.  Do not use any tobacco products including cigarettes, chewing tobacco, or electronic cigarettes.  Limit alcohol use even if you are not taking warfarin. Moderate alcohol use is considered to be:  No more than 2 drinks each day for men.  No more than 1 drink each day for nonpregnant women.  Home safety. A safe home environment is important to reduce the risk of falls. Your health care provider may arrange for specialists to evaluate your home. Having grab bars in the bedroom and bathroom is often important. Your health care provider may arrange for equipment to be used at home, such as raised toilets and a seat for the shower.  Physical, occupational, and speech therapy. Ongoing therapy may be needed to maximize your recovery after a stroke. If you have been advised to use a walker or a cane, use it at all times. Be sure to keep your therapy appointments.  Follow all instructions for follow-up with your health care provider. This is very important. This includes any referrals, physical therapy, rehabilitation, and lab tests. Proper follow-up can prevent another stroke from occurring. SEEK MEDICAL CARE IF:  You have personality changes.  You have difficulty swallowing.  You are seeing double.  You have dizziness.  You have  a fever.  You have skin breakdown. SEEK IMMEDIATE MEDICAL CARE IF:  Any of these symptoms may represent a serious problem that is an emergency. Do not wait to see if the symptoms will go away. Get medical help right away. Call your local emergency services (911 in [place] Do not drive yourself to the hospital.  You have sudden weakness or numbness of the face, arm, or leg, especially on one side of the body.  You have sudden trouble walking or difficulty moving arms or legs.  You have sudden confusion.  You have trouble speaking (aphasia) or understanding.  You have sudden trouble seeing in one or both eyes.  You have a loss of balance or coordination.  You have a sudden, severe headache with no known cause.  You have new chest pain or an irregular heartbeat.  You have a partial or total loss of consciousness. Document Released: 12/02/2005 Document Revised: 04/18/2014 Document Reviewed: 07/12/2012 [facility] Patient Information 2015 [place], [place]. This information is not intended to replace advice given to you by your  health care provider. Make sure you discuss any questions you have with your health care provider. ° °

## 2014-11-02 NOTE — Progress Notes (Signed)
Subjective:  Patient was seen and examined this morning. Patient is doing well and working with PT. She continues have numbness in her 3,4 fingers in her left hand and weakness in her left hand. Mildly improved from yesterday. Patient states she has not had a bowel movement since last Wednesday. Patient denies any other complaints.  Objective: Vital signs in last 24 hours: Filed Vitals:   11/02/14 0925 11/02/14 0930 11/02/14 0935 11/02/14 0940  BP: 178/101 146/78 143/86 140/79  Pulse: 108 112 104 112  Temp:      TempSrc:      Resp: 24 21 22 26   Height:      Weight:      SpO2: 100% 100% 100% 100%   Weight change:   Intake/Output Summary (Last 24 hours) at 11/02/14 0944 Last data filed at 11/01/14 1430  Gross per 24 hour  Intake    120 ml  Output      0 ml  Net    120 ml   General: Vital signs reviewed. Patient is well-developed and well-nourished, in no acute distress and cooperative with exam.  Cardiovascular: Regular rate, irregular rhythm. Pulmonary/Chest: Clear to auscultation bilaterally, no wheezes, rales, or rhonchi. Abdominal: Soft, non-tender, non-distended, BS +, no masses, organomegaly, or guarding present.  Musculoskeletal: R knee scar, well healed. L knee well bandaged.  Extremities: Non-pitting edema in left lower extremity, no calf tenderness, pulses symmetric and intact bilaterally. No cyanosis or clubbing. Neurological: A&O x3, 4/5 Strength in left hand, RUE, and RLE is normal. Decreased sensation in left hand 3, 4 fingers. Strength in LLE is difficult to assess 2/2 to pain from recent surgery, cranial nerve II-XII are grossly intact, no focal motor deficit. Skin: Warm, dry and intact. No rashes or erythema. Psychiatric: Normal mood and affect. speech and behavior is normal. Cognition and memory are normal.   Lab Results: Basic Metabolic Panel:  Recent Labs Lab 11/01/14 0743 11/02/14 0740  NA 136* 133*  K 4.1 4.4  CL 99 97  CO2 24 23  GLUCOSE  101* 96  BUN 10 9  CREATININE 0.72 0.75  CALCIUM 8.7 8.8   Liver Function Tests:  Recent Labs Lab 10/31/14 1038  AST 40*  ALT 15  ALKPHOS 83  BILITOT 0.7  PROT 6.4  ALBUMIN 2.7*   CBC:  Recent Labs Lab 10/28/14 0409 10/31/14 1038 10/31/14 1046  WBC 11.4* 5.8  --   NEUTROABS  --  3.9  --   HGB 8.6* 8.2* 8.8*  HCT 26.0* 25.3* 26.0*  MCV 79.8 81.4  --   PLT 146* 231  --    Cardiac Enzymes:  Recent Labs Lab 11/01/14 0743 11/01/14 1359 11/01/14 2003  TROPONINI 0.38* 0.31* <0.30   CBG:  Recent Labs Lab 10/31/14 1102  GLUCAP 90   Fasting Lipid Panel:  Recent Labs Lab 11/01/14 0129  CHOL 129  HDL 46  LDLCALC 62  TRIG 105  CHOLHDL 2.8   Coagulation:  Recent Labs Lab 10/31/14 1038  LABPROT 15.3*  INR 1.19   Studies/Results: Ct Head (brain) Wo Contrast  10/31/2014   CLINICAL DATA:  Code stroke.  Left hand numbness.  EXAM: CT HEAD WITHOUT CONTRAST  TECHNIQUE: Contiguous axial images were obtained from the base of the skull through the vertex without intravenous contrast.  COMPARISON:  None.  FINDINGS: Focal area of low attenuation within the left basal ganglia compatible with subacute to chronic lacunar infarct. There is mild patchy low attenuation within the periventricular and  subcortical white matter compatible with chronic microvascular disease. The mastoid air cells and the paranasal sinuses are clear. The calvarium appears intact.  IMPRESSION: 1. No acute intracranial abnormalities. 2. Chronic small vessel ischemic disease.   Electronically Signed   By: Signa Kellaylor  Stroud M.D.   On: 10/31/2014 10:37   Mr Brain Wo Contrast  10/31/2014   CLINICAL DATA:  Initial evaluation for acute onset left facial droop, decreased sensation and left hand  EXAM: MRI HEAD WITHOUT CONTRAST  MRA HEAD WITHOUT CONTRAST  TECHNIQUE: Multiplanar, multiecho pulse sequences of the brain and surrounding structures were obtained without intravenous contrast. Angiographic images of  the head were obtained using MRA technique without contrast.  COMPARISON:  Prior noncontrast head CT from earlier the same day.  FINDINGS: MRI HEAD FINDINGS  There is a subtle curvilinear focus of restricted diffusion within the cortex of the posterior or right frontal lobe (series 4, image 22), of compatible with acute ischemic infarct. No significant mass effect or associated hemorrhage. No other acute ischemic infarct identified. Gray-white matter differentiation otherwise maintained. Normal flow voids seen within the intracranial vasculature.  Mild diffuse prominence of the CSF containing spaces is compatible with generalized age-related cerebral atrophy. Patchy T2/FLAIR hyperintensities within the periventricular deep white matter both cerebral hemispheres is present, nonspecific, but likely related to moderate chronic small vessel ischemic changes. Subcentimeter hypo intense focus within the posterior left frontal lobe on gradient echo sequence likely reflects a small chronic microhemorrhage. Probable additional small microhemorrhage present within the right thalamus.  No mass lesion or midline shift. Ventricles are normal in size without evidence of hydrocephalus. No extra-axial fluid collection.  Pituitary gland within normal limits. Craniocervical junction unremarkable. No acute abnormality seen about the orbits.  Mild mucosal thickening present within the inferior left maxillary sinus. Paranasal sinuses are otherwise clear. No mastoid effusion.  Bone marrow signal intensity within normal limits. Visualized upper cervical spine unremarkable.  MRA HEAD FINDINGS  ANTERIOR CIRCULATION:  Visualized distal cervical segments of the internal carotid arteries are widely patent with antegrade flow. The petrous, cavernous, and clinoid segments are widely patent without hemodynamically significant stenosis. Mild irregularity within the cavernous right ICA likely related to underlying atheromatous plaque. A1 segments,  anterior communicating artery, and anterior cerebral arteries are within normal limits.  M1 segments are widely patent bilaterally without proximal branch occlusion or hemodynamically significant stenosis. Distal MCA branches well opacified bilaterally.  POSTERIOR CIRCULATION:  Vertebral arteries are codominant and widely patent bilaterally. Partially visualized posterior inferior cerebral arteries patent. Vertebrobasilar junction is within normal limits. The basilar artery itself is mildly tortuous but widely patent. Posterior cerebral arteries and superior cerebral arteries are patent bilaterally.  No convincing evidence for aneurysm or vascular malformation.  IMPRESSION: MRI HEAD IMPRESSION:  1. Small subtle cortical ischemic infarct involving the posterior right frontal lobe as above. No significant mass effect or associated hemorrhage. 2. Atrophy with moderate chronic small vessel ischemic disease.  MRA HEAD IMPRESSION:  No proximal branch occlusion or hemodynamically significant stenosis identified within the intracranial circulation.   Electronically Signed   By: Rise MuBenjamin  McClintock M.D.   On: 10/31/2014 22:23   Mr Maxine GlennMra Head/brain Wo Cm  10/31/2014   CLINICAL DATA:  Initial evaluation for acute onset left facial droop, decreased sensation and left hand  EXAM: MRI HEAD WITHOUT CONTRAST  MRA HEAD WITHOUT CONTRAST  TECHNIQUE: Multiplanar, multiecho pulse sequences of the brain and surrounding structures were obtained without intravenous contrast. Angiographic images of the head were obtained using  MRA technique without contrast.  COMPARISON:  Prior noncontrast head CT from earlier the same day.  FINDINGS: MRI HEAD FINDINGS  There is a subtle curvilinear focus of restricted diffusion within the cortex of the posterior or right frontal lobe (series 4, image 22), of compatible with acute ischemic infarct. No significant mass effect or associated hemorrhage. No other acute ischemic infarct identified. Gray-white  matter differentiation otherwise maintained. Normal flow voids seen within the intracranial vasculature.  Mild diffuse prominence of the CSF containing spaces is compatible with generalized age-related cerebral atrophy. Patchy T2/FLAIR hyperintensities within the periventricular deep white matter both cerebral hemispheres is present, nonspecific, but likely related to moderate chronic small vessel ischemic changes. Subcentimeter hypo intense focus within the posterior left frontal lobe on gradient echo sequence likely reflects a small chronic microhemorrhage. Probable additional small microhemorrhage present within the right thalamus.  No mass lesion or midline shift. Ventricles are normal in size without evidence of hydrocephalus. No extra-axial fluid collection.  Pituitary gland within normal limits. Craniocervical junction unremarkable. No acute abnormality seen about the orbits.  Mild mucosal thickening present within the inferior left maxillary sinus. Paranasal sinuses are otherwise clear. No mastoid effusion.  Bone marrow signal intensity within normal limits. Visualized upper cervical spine unremarkable.  MRA HEAD FINDINGS  ANTERIOR CIRCULATION:  Visualized distal cervical segments of the internal carotid arteries are widely patent with antegrade flow. The petrous, cavernous, and clinoid segments are widely patent without hemodynamically significant stenosis. Mild irregularity within the cavernous right ICA likely related to underlying atheromatous plaque. A1 segments, anterior communicating artery, and anterior cerebral arteries are within normal limits.  M1 segments are widely patent bilaterally without proximal branch occlusion or hemodynamically significant stenosis. Distal MCA branches well opacified bilaterally.  POSTERIOR CIRCULATION:  Vertebral arteries are codominant and widely patent bilaterally. Partially visualized posterior inferior cerebral arteries patent. Vertebrobasilar junction is within  normal limits. The basilar artery itself is mildly tortuous but widely patent. Posterior cerebral arteries and superior cerebral arteries are patent bilaterally.  No convincing evidence for aneurysm or vascular malformation.  IMPRESSION: MRI HEAD IMPRESSION:  1. Small subtle cortical ischemic infarct involving the posterior right frontal lobe as above. No significant mass effect or associated hemorrhage. 2. Atrophy with moderate chronic small vessel ischemic disease.  MRA HEAD IMPRESSION:  No proximal branch occlusion or hemodynamically significant stenosis identified within the intracranial circulation.   Electronically Signed   By: Rise Mu M.D.   On: 10/31/2014 22:23   Medications:  I have reviewed the patient's current medications. Prior to Admission:  Prescriptions prior to admission  Medication Sig Dispense Refill Last Dose  . acetaminophen (TYLENOL) 650 MG CR tablet Take 650 mg by mouth daily as needed for pain.    10/30/2014 at Unknown time  . Ascorbic Acid (VITAMIN C PO) Take 1 tablet by mouth daily.   10/30/2014 at Unknown time  . aspirin EC 325 MG tablet Take 1 tablet (325 mg total) by mouth 2 (two) times daily after a meal. 60 tablet 0 10/30/2014 at Unknown time  . dicyclomine (BENTYL) 10 MG capsule Take 10 mg by mouth daily before lunch.   10/30/2014 at Unknown time  . diltiazem (DILACOR XR) 240 MG 24 hr capsule Take 240 mg by mouth daily.   10/30/2014 at Unknown time  . HYDROcodone-acetaminophen (NORCO) 5-325 MG per tablet Take 1-2 tablets by mouth every 6 (six) hours as needed for moderate pain. 40 tablet 0 10/31/2014 at Unknown time  . lisinopril-hydrochlorothiazide (PRINZIDE,ZESTORETIC) 20-12.5  MG per tablet Take 1 tablet by mouth daily.   10/30/2014 at Unknown time  . methocarbamol (ROBAXIN) 500 MG tablet Take 1 tablet (500 mg total) by mouth every 8 (eight) hours as needed for muscle spasms. 40 tablet 0 10/30/2014 at Unknown time  . metoprolol tartrate (LOPRESSOR) 25 MG  tablet Take 25 mg by mouth daily.   10/30/2014 at 1100  . omeprazole (PRILOSEC) 20 MG capsule Take 20 mg by mouth daily.   10/30/2014 at Unknown time  . Oxymetazoline HCl (AFRIN NASAL SPRAY NA) Place 2-3 sprays into both nostrils daily as needed (congestion).    10/30/2014 at Unknown time  . Polyethyl Glycol-Propyl Glycol (SYSTANE OP) Place 2 drops into both eyes daily.    10/30/2014 at Unknown time  . potassium chloride (K-DUR) 10 MEQ tablet Take 1 tablet by mouth daily.  0 10/30/2014 at Unknown time  . simvastatin (ZOCOR) 20 MG tablet Take 20 mg by mouth daily.   10/30/2014 at Unknown time   Scheduled Meds: . [MAR Hold] clopidogrel  75 mg Oral Daily  . [MAR Hold] feeding supplement (RESOURCE BREEZE)  1 Container Oral Q24H  . [MAR Hold] heparin  5,000 Units Subcutaneous 3 times per day  . [MAR Hold] oxymetazoline  1 spray Each Nare BID  . [MAR Hold] pantoprazole  40 mg Oral Daily  . [MAR Hold] senna-docusate  2 tablet Oral Daily  . [MAR Hold] simvastatin  20 mg Oral q1800   Continuous Infusions: . sodium chloride 20 mL/hr at 11/02/14 0909   PRN Meds:.butamben-tetracaine-benzocaine, fentaNYL, [MAR Hold] HYDROcodone-acetaminophen, midazolam Assessment/Plan: Active Problems:   CVA (cerebral infarction)   Acute ischemic stroke   Elevated troponin   Absolute anemia   Cerebral infarction due to unspecified mechanism  Possible Acute CVA: MRI/MRA showed mall subtle cortical ischemic infarct involving the posterior right frontal lobe as above. Echo showed 55-60% EF, moderate aortic regurgitation, Mitral valve leaflet appears very thickened, suggestive of myxomatous MV or vegetation which could be secondary to MV replacement/repair. Patient has been transitioned from ASA to Plavix. Patient will go for a TEE today -TEE today -Plavix 75 mg daily -Simvastatin 20 mg daily -Cardiac Diet -Neuro checks -PT/OT/SLP  -Telemetry -Permissive HTN -Hemoglobin A1c pending  Constipation: Patient has  not had a bowel movement in one week. She is interested in trying a soap suds enema. -Soap suds enema  MVR s/p Valve Repair: Patient is on diltiazem 240 mg daily, metoprolol 25 mg daily at home.On TTE, mitral valve leaflet appears very thickened, suggestive of myxomatous MV or vegetation which could be secondary to MV replacement/repair. -Cardiac monitor -Hold metoprolol and diltiazem to allow for permissive hypertension -TEE  L Knee Replacement: Patient is doing well. She is working well with PT/OT. Ortho is following the patient is happy with her progress. We will discuss DVT prophylaxis with ortho. Chest guidelines recommend LMWH for 10-14 days post-op.  -PT/OT -CPM -Compression Stockings -Plavix 75 mg daily -Heparin TID -Discuss LMWH  HTN: BP has been 140s/80s. Patient is normally on diltiazem 240 mg daily, lisinopril-HCTZ 20-12.5 mg daily and metoprolol 25 mg daily at home. -Hold meds for permissive HTN  HLD: Most recent lipid panel on 08/13/2007 shows cholesterol of 130, TG 74, HDL 65, LDL 50. Patient is on simvastatin 20 mg daily. Recent lipid panel shows cholesterol of 129, TG 105, HDL 46, LDL 62.  -Simvastatin 20 mg daily   DVT/PE ppx: Heparin SQ TID  Dispo: Disposition is deferred at this time, awaiting improvement of  current medical problems.  Anticipated discharge in approximately 1-2 day(s).   The patient does have a current PCP Samson Frederic, MD) and does not need an [facility] hospital follow-up appointment after discharge.  The patient does not have transportation limitations that hinder transportation to clinic appointments.  .Services Needed at time of discharge: Y = Yes, Blank = No PT:   OT:   RN:   Equipment:   Other:     LOS: 2 days   Jill Alexanders, DO PGY-1 Internal Medicine Resident Pager # [phone] 11/02/2014 9:44 AM

## 2014-11-02 NOTE — Progress Notes (Signed)
  Echocardiogram 2D Echocardiogram has been performed.  [name], [name] 11/02/2014, 9:48 AM

## 2014-11-02 NOTE — Progress Notes (Signed)
Physical Therapy Treatment Patient Details Name: Janet GrumblesSusan A Patnode MRN: [id number] DOB: 1934-06-14 Today's Date: 11/02/2014    History of Present Illness 80 y.o. female admitted 10/31/14 with speech difficulty. + Rt corona radiata on MRI.  Marland Kitchen. Pt with significant PMHx 10/26/14 L TKA, R TKA, HTN, and mitral valve repair.    PT Comments    Patient is progressing with ambulation this AM. Able to incorporate HEP this session. Going down for TEE later today. Will continue with current POC  Follow Up Recommendations  Home health PT;Supervision for mobility/OOB     Equipment Recommendations  None recommended by PT    Recommendations for Other Services       Precautions / Restrictions Precautions Precautions: Fall;Knee Precaution Comments: reinforced positioning for knee extension and decr edema Restrictions Weight Bearing Restrictions: Yes LLE Weight Bearing: Weight bearing as tolerated    Mobility  Bed Mobility Overal bed mobility: Modified Independent                Transfers Overall transfer level: Needs assistance Equipment used: Rolling walker (2 wheeled)   Sit to Stand: Min guard         General transfer comment: Cues for safe hand placemet and not to pull up on RW  Ambulation/Gait Ambulation/Gait assistance: Min assist Ambulation Distance (Feet): 180 Feet Assistive device: Rolling walker (2 wheeled) Gait Pattern/deviations: Step-to pattern;Decreased stance time - left;Decreased step length - right Gait velocity: decreased   General Gait Details: Cues for upright posture and to stay closer to RW. Min A for positioning of RW.    Stairs            Wheelchair Mobility    Modified Rankin (Stroke Patients Only) Modified Rankin (Stroke Patients Only) Pre-Morbid Rankin Score: No symptoms Modified Rankin: Moderately severe disability     Balance                                    Cognition Arousal/Alertness: Awake/alert Behavior  During Therapy: WFL for tasks assessed/performed Overall Cognitive Status: Within Functional Limits for tasks assessed                      Exercises Total Joint Exercises Quad Sets: AROM;Supine;Left;15 reps Heel Slides: AROM;Left;15 reps Hip ABduction/ADduction: AROM;15 reps;Left Straight Leg Raises: Left;AAROM;15 reps Long Arc Quad: AROM;Left;15 reps    General Comments        Pertinent Vitals/Pain Pain Score: 6  Pain Location: Lt knee with movement Pain Descriptors / Indicators: Aching;Sore Pain Intervention(s): Monitored during session    Home Living                      Prior Function            PT Goals (current goals can now be found in the care plan section) Progress towards PT goals: Progressing toward goals    Frequency  Min 4X/week    PT Plan Current plan remains appropriate    Co-evaluation             End of Session Equipment Utilized During Treatment: Gait belt Activity Tolerance: Patient tolerated treatment well Patient left: in chair;with call bell/phone within reach;with chair alarm set     Time: 0801-0826 PT Time Calculation (min) (ACUTE ONLY): 25 min  Charges:  $Gait Training: 8-22 mins $Therapeutic Exercise: 8-22 mins  G Codes:      Fredrich BirksRobinette, Ottilia Pippenger Elizabeth 11/02/2014, 8:32 AM 11/02/2014 Fredrich Birksobinette, Jaydalynn Olivero Elizabeth PTA [phone] pager [phone] office

## 2014-11-02 NOTE — H&P (View-Only) (Signed)
STROKE TEAM PROGRESS NOTE   HISTORY Estelle GrumblesSusan A Apt is an 80 y.o. female who was awake at 0200 hours 10/31/2014 when daughter noted left facial droop and patient stated she had decreased sensation in her left hand and weakness in her left hand. EMS was called and patient was brought to ED as code stroke. On arrival she was 8 hours out and not a tPA or intervention candidate. Currently her only complaint is slight decreased sensation on the left face and left hand. Initially it was though that patient was in new onset Afib but after looking closely at rhythm strip it is NSR. NIHSS 4. Patient was not administered TPA secondary to delay in arrival. She was admitted for further evaluation and treatment.   SUBJECTIVE (INTERVAL HISTORY) Her daughter is at the bedside.  Overall she feels her condition is stable.    OBJECTIVE Temp:  [97.2 F (36.2 C)-100 F (37.8 C)] 99.1 F (37.3 C) (11/17 1050) Pulse Rate:  [83-111] 102 (11/17 1050) Cardiac Rhythm:  [-] Normal sinus rhythm;Other (Comment) (11/16 2000) Resp:  [16-25] 18 (11/17 1050) BP: (122-154)/(72-89) 154/88 mmHg (11/17 1050) SpO2:  [95 %-100 %] 99 % (11/17 1050)   Recent Labs Lab 10/31/14 1102  GLUCAP 90    Recent Labs Lab 10/27/14 0500 10/28/14 0409 10/31/14 1038 10/31/14 1046 11/01/14 0743  NA 141 137 135* 135* 136*  K 3.3* 3.2* 3.0* 2.8* 4.1  CL 101 97 96 96 99  CO2 23 22 25   --  24  GLUCOSE 106* 112* 105* 105* 101*  BUN 11 13 16 14 10   CREATININE 0.90 0.85 0.79 0.80 0.72  CALCIUM 8.0* 8.3* 8.8  --  8.7    Recent Labs Lab 10/31/14 1038  AST 40*  ALT 15  ALKPHOS 83  BILITOT 0.7  PROT 6.4  ALBUMIN 2.7*    Recent Labs Lab 10/26/14 1119 10/27/14 0500 10/28/14 0409 10/31/14 1038 10/31/14 1046  WBC  --  7.3 11.4* 5.8  --   NEUTROABS  --   --   --  3.9  --   HGB 13.3 9.2* 8.6* 8.2* 8.8*  HCT 39.0 28.0* 26.0* 25.3* 26.0*  MCV  --  80.0 79.8 81.4  --   PLT  --  169 146* 231  --     Recent Labs Lab  10/31/14 1526 10/31/14 2143 11/01/14 0129 11/01/14 0743  TROPONINI 0.44* 0.69* 0.66* 0.38*    Recent Labs  10/31/14 1038  LABPROT 15.3*  INR 1.19   No results for input(s): COLORURINE, LABSPEC, PHURINE, GLUCOSEU, HGBUR, BILIRUBINUR, KETONESUR, PROTEINUR, UROBILINOGEN, NITRITE, LEUKOCYTESUR in the last 72 hours.  Invalid input(s): APPERANCEUR     Component Value Date/Time   CHOL 129 11/01/2014 0129   TRIG 105 11/01/2014 0129   HDL 46 11/01/2014 0129   CHOLHDL 2.8 11/01/2014 0129   VLDL 21 11/01/2014 0129   LDLCALC 62 11/01/2014 0129   Lab Results  Component Value Date   HGBA1C  07/03/2009    5.2 (NOTE) The ADA recommends the following therapeutic goal for glycemic control related to Hgb A1c measurement: Goal of therapy: <6.5 Hgb A1c  Reference: American Diabetes Association: Clinical Practice Recommendations 2010, Diabetes Care, 2010, 33: (Suppl  1).   No results found for: LABOPIA, COCAINSCRNUR, LABBENZ, AMPHETMU, THCU, LABBARB  No results for input(s): ETH in the last 168 hours.  Ct Head (brain) Wo Contrast  10/31/2014   CLINICAL DATA:  Code stroke.  Left hand numbness.  EXAM: CT HEAD WITHOUT CONTRAST  TECHNIQUE: Contiguous axial images were obtained from the base of the skull through the vertex without intravenous contrast.  COMPARISON:  None.  FINDINGS: Focal area of low attenuation within the left basal ganglia compatible with subacute to chronic lacunar infarct. There is mild patchy low attenuation within the periventricular and subcortical white matter compatible with chronic microvascular disease. The mastoid air cells and the paranasal sinuses are clear. The calvarium appears intact.  IMPRESSION: 1. No acute intracranial abnormalities. 2. Chronic small vessel ischemic disease.   Electronically Signed   By: Signa Kell M.D.   On: 10/31/2014 10:37   Mr Brain Wo Contrast  10/31/2014   CLINICAL DATA:  Initial evaluation for acute onset left facial droop, decreased  sensation and left hand  EXAM: MRI HEAD WITHOUT CONTRAST  MRA HEAD WITHOUT CONTRAST  TECHNIQUE: Multiplanar, multiecho pulse sequences of the brain and surrounding structures were obtained without intravenous contrast. Angiographic images of the head were obtained using MRA technique without contrast.  COMPARISON:  Prior noncontrast head CT from earlier the same day.  FINDINGS: MRI HEAD FINDINGS  There is a subtle curvilinear focus of restricted diffusion within the cortex of the posterior or right frontal lobe (series 4, image 22), of compatible with acute ischemic infarct. No significant mass effect or associated hemorrhage. No other acute ischemic infarct identified. Gray-white matter differentiation otherwise maintained. Normal flow voids seen within the intracranial vasculature.  Mild diffuse prominence of the CSF containing spaces is compatible with generalized age-related cerebral atrophy. Patchy T2/FLAIR hyperintensities within the periventricular deep white matter both cerebral hemispheres is present, nonspecific, but likely related to moderate chronic small vessel ischemic changes. Subcentimeter hypo intense focus within the posterior left frontal lobe on gradient echo sequence likely reflects a small chronic microhemorrhage. Probable additional small microhemorrhage present within the right thalamus.  No mass lesion or midline shift. Ventricles are normal in size without evidence of hydrocephalus. No extra-axial fluid collection.  Pituitary gland within normal limits. Craniocervical junction unremarkable. No acute abnormality seen about the orbits.  Mild mucosal thickening present within the inferior left maxillary sinus. Paranasal sinuses are otherwise clear. No mastoid effusion.  Bone marrow signal intensity within normal limits. Visualized upper cervical spine unremarkable.  MRA HEAD FINDINGS  ANTERIOR CIRCULATION:  Visualized distal cervical segments of the internal carotid arteries are widely patent  with antegrade flow. The petrous, cavernous, and clinoid segments are widely patent without hemodynamically significant stenosis. Mild irregularity within the cavernous right ICA likely related to underlying atheromatous plaque. A1 segments, anterior communicating artery, and anterior cerebral arteries are within normal limits.  M1 segments are widely patent bilaterally without proximal branch occlusion or hemodynamically significant stenosis. Distal MCA branches well opacified bilaterally.  POSTERIOR CIRCULATION:  Vertebral arteries are codominant and widely patent bilaterally. Partially visualized posterior inferior cerebral arteries patent. Vertebrobasilar junction is within normal limits. The basilar artery itself is mildly tortuous but widely patent. Posterior cerebral arteries and superior cerebral arteries are patent bilaterally.  No convincing evidence for aneurysm or vascular malformation.  IMPRESSION: MRI HEAD IMPRESSION:  1. Small subtle cortical ischemic infarct involving the posterior right frontal lobe as above. No significant mass effect or associated hemorrhage. 2. Atrophy with moderate chronic small vessel ischemic disease.  MRA HEAD IMPRESSION:  No proximal branch occlusion or hemodynamically significant stenosis identified within the intracranial circulation.   Electronically Signed   By: Rise Mu M.D.   On: 10/31/2014 22:23   Mr Maxine Glenn Head/brain Wo Cm  10/31/2014   CLINICAL  DATA:  Initial evaluation for acute onset left facial droop, decreased sensation and left hand  EXAM: MRI HEAD WITHOUT CONTRAST  MRA HEAD WITHOUT CONTRAST  TECHNIQUE: Multiplanar, multiecho pulse sequences of the brain and surrounding structures were obtained without intravenous contrast. Angiographic images of the head were obtained using MRA technique without contrast.  COMPARISON:  Prior noncontrast head CT from earlier the same day.  FINDINGS: MRI HEAD FINDINGS  There is a subtle curvilinear focus of  restricted diffusion within the cortex of the posterior or right frontal lobe (series 4, image 22), of compatible with acute ischemic infarct. No significant mass effect or associated hemorrhage. No other acute ischemic infarct identified. Gray-white matter differentiation otherwise maintained. Normal flow voids seen within the intracranial vasculature.  Mild diffuse prominence of the CSF containing spaces is compatible with generalized age-related cerebral atrophy. Patchy T2/FLAIR hyperintensities within the periventricular deep white matter both cerebral hemispheres is present, nonspecific, but likely related to moderate chronic small vessel ischemic changes. Subcentimeter hypo intense focus within the posterior left frontal lobe on gradient echo sequence likely reflects a small chronic microhemorrhage. Probable additional small microhemorrhage present within the right thalamus.  No mass lesion or midline shift. Ventricles are normal in size without evidence of hydrocephalus. No extra-axial fluid collection.  Pituitary gland within normal limits. Craniocervical junction unremarkable. No acute abnormality seen about the orbits.  Mild mucosal thickening present within the inferior left maxillary sinus. Paranasal sinuses are otherwise clear. No mastoid effusion.  Bone marrow signal intensity within normal limits. Visualized upper cervical spine unremarkable.  MRA HEAD FINDINGS  ANTERIOR CIRCULATION:  Visualized distal cervical segments of the internal carotid arteries are widely patent with antegrade flow. The petrous, cavernous, and clinoid segments are widely patent without hemodynamically significant stenosis. Mild irregularity within the cavernous right ICA likely related to underlying atheromatous plaque. A1 segments, anterior communicating artery, and anterior cerebral arteries are within normal limits.  M1 segments are widely patent bilaterally without proximal branch occlusion or hemodynamically significant  stenosis. Distal MCA branches well opacified bilaterally.  POSTERIOR CIRCULATION:  Vertebral arteries are codominant and widely patent bilaterally. Partially visualized posterior inferior cerebral arteries patent. Vertebrobasilar junction is within normal limits. The basilar artery itself is mildly tortuous but widely patent. Posterior cerebral arteries and superior cerebral arteries are patent bilaterally.  No convincing evidence for aneurysm or vascular malformation.  IMPRESSION: MRI HEAD IMPRESSION:  1. Small subtle cortical ischemic infarct involving the posterior right frontal lobe as above. No significant mass effect or associated hemorrhage. 2. Atrophy with moderate chronic small vessel ischemic disease.  MRA HEAD IMPRESSION:  No proximal branch occlusion or hemodynamically significant stenosis identified within the intracranial circulation.   Electronically Signed   By: Rise Mu M.D.   On: 10/31/2014 22:23   Carotid Doppler  There is 1-39% bilateral ICA stenosis. Vertebral artery flow is antegrade.    PHYSICAL EXAM Pleasant elderly african american lady not in distress.Awake alert. Afebrile. Head is nontraumatic. Neck is supple without bruit. Hearing is normal. Cardiac exam no murmur or gallop. Lungs are clear to auscultation. Distal pulses are well felt. Neurological Exam : Awake alert oriented x 3 normal speech and language. Mild left lower face asymmetry. Tongue midline. No drift. Mild diminished fine finger movements on left. Orbits right over left upper extremity. Mild left grip weak.. Normal sensation except hyperesthesia left hand finger tips . Normal coordination. ASSESSMENT/PLAN Ms. ETHELEAN COLLA is a 80 y.o. female with history of hypertension, hyperlipidemia, s/p mitral valve  repair in 2010, s/p bilateral knee replacements (left on 10/26/2014) and peptic ulcer disease presenting with decreased sensation and weakness in her L hand. She did not receive IV t-PA due to delay in  arrival.   Stroke:  Non-dominant right posterior frontal lobe infarct secondary to small vessel disease source  Resultant  Left arm hemisensory deficit  MRI  Small right poster frontal lobe infarct  MRA  No significant stenosis   Carotid Doppler  No significant stenosis   2D Echo  pending   HgbA1c pending  Heparin 5000 units sq tid for VTE prophylaxis  Diet Heart thin liquids  aspirin 325 mg orally every day prior to admission, now on aspirin 325 mg orally every day. Recommend change to plavix 75 mg daily given stroke while on aspirin. Order written.  Patient counseled to be compliant with her antithrombotic medications  Ongoing aggressive risk factor management  Therapy recommendations:  pending   Disposition:  Anticipate return home  Hypertension  Stable  Hyperlipidemia  Home meds:  Zocor 20 mg daily resumed in hospital  LDL 62, at goal < 70  Continue statin at discharge  Other Stroke Risk Factors  Advanced age  Former Cigarette smoker  Hospital day # 1  Annie MainSHARON BIBY, MSN, APRN, ANVP-BC, AGPCNP-BC [facility] Pager: [phone] 11/01/2014 10:56 AM   I have personally examined this patient, reviewed notes, independently viewed imaging studies, participated in medical decision making and plan of care. I have made any additions or clarifications directly to the above note. Agree with note above. She has small lacunar infarct secondary to small vessel disease. Needs therapy eval and to decide on disposition. Discussed with daughter and patient and answered questions  Delia HeadyPramod Sethi, MD Medical Director [facility] Pager: [phone] 11/01/2014 8:09 PM   To contact Stroke Continuity provider, please refer to [email or web address]. After hours, contact General Neurology

## 2014-11-03 ENCOUNTER — Encounter (HOSPITAL_COMMUNITY): Payer: Self-pay | Admitting: Cardiovascular Disease

## 2014-12-19 ENCOUNTER — Encounter: Payer: Self-pay | Admitting: *Deleted

## 2014-12-19 DIAGNOSIS — Z09 Encounter for follow-up examination after completed treatment for conditions other than malignant neoplasm: Secondary | ICD-10-CM | POA: Diagnosis not present

## 2014-12-20 ENCOUNTER — Encounter: Payer: Self-pay | Admitting: Thoracic Surgery (Cardiothoracic Vascular Surgery)

## 2014-12-20 ENCOUNTER — Institutional Professional Consult (permissible substitution) (INDEPENDENT_AMBULATORY_CARE_PROVIDER_SITE_OTHER): Payer: Medicare Other | Admitting: Thoracic Surgery (Cardiothoracic Vascular Surgery)

## 2014-12-20 VITALS — BP 135/86 | HR 73 | Resp 18 | Ht 59.0 in | Wt 134.0 lb

## 2014-12-20 DIAGNOSIS — I10 Essential (primary) hypertension: Secondary | ICD-10-CM | POA: Insufficient documentation

## 2014-12-20 DIAGNOSIS — I050 Rheumatic mitral stenosis: Secondary | ICD-10-CM | POA: Insufficient documentation

## 2014-12-20 DIAGNOSIS — D649 Anemia, unspecified: Secondary | ICD-10-CM | POA: Insufficient documentation

## 2014-12-20 DIAGNOSIS — I5042 Chronic combined systolic (congestive) and diastolic (congestive) heart failure: Secondary | ICD-10-CM | POA: Diagnosis not present

## 2014-12-20 DIAGNOSIS — K922 Gastrointestinal hemorrhage, unspecified: Secondary | ICD-10-CM | POA: Insufficient documentation

## 2014-12-20 DIAGNOSIS — I272 Pulmonary hypertension, unspecified: Secondary | ICD-10-CM | POA: Insufficient documentation

## 2014-12-20 NOTE — Progress Notes (Signed)
[address]       [place] [address]             [phone]     CARDIOTHORACIC SURGERY CONSULTATION REPORT  Referring Provider is Pamella Pert, MD PCP is Samson Frederic., MD  Chief Complaint  Patient presents with  . Mitral Stenosis    Eval for MVR    HPI:  Patient is a 81 year old African-American female with history of mitral valve prolapse status post mitral valve repair in 2010, chronic combined systolic and diastolic congestive heart failure, hypertension, chronic anemia, arthritis and remote history of GI bleeding.  The patient lives a somewhat sedentary lifestyle and has been limited significantly by progressive degenerative arthritis in both knees. She underwent right total knee replacement in June of this year and recovered uneventfully. She subsequently underwent left total knee replacement on 10/26/2014. She initially did well but she was readmitted to the hospital 10/31/2014 with an acute stroke manifest as left-sided facial droop and left hand weakness. MRI revealed a small right frontal thrombotic infarct. Symptoms of the stroke improved quickly, leaving her with mild residual weakness in the left hand. During her hospitalization she underwent transthoracic and transesophageal echocardiograms to rule out source of embolization. Both transthoracic and transesophageal echocardiograms revealed intact mitral valve repair with no residual mitral regurgitation, but there was suggestion of possible severe mitral stenosis.  The patient was seen recently in follow-up by Dr. Jacinto Halim and has now been referred to consider redo mitral valve replacement.  The patient is single and lives locally in [place] with one of her daughters. She lives a sedentary lifestyle and has been limited primarily by degenerative arthritis in both knees. However, she remains functionally independent. She describes a long-standing history of symptoms of exertional shortness of  breath dating back many years, prior to her original mitral valve repair in 2010. She states that the symptoms have been very stable and she thought she was doing well prior to her stroke. She states that her shortness of breath does wax and wane somewhat in severity, but for the most part it does not limit her day-to-day physical activities. At times she reports her shortness of breath is worse if she lays flat in bed. She has had some bilateral lower extremity edema since her knee surgery, but none before that. She has had occasional palpitation. She denies any history of chest pain. She denies any history of resting shortness of breath or PND.  She was recently started on oral Lasix diuretic therapy by Dr. Jacinto Halim. She is not monitoring her weight.  Past Medical History  Diagnosis Date  . Family history of anesthesia complication     Daughter; takes a long time to wake up  . Hypertension   . Shortness of breath     while walking  . Frequency of urination     at time  . GERD (gastroesophageal reflux disease)   . Arthritis   . Hyperlipemia   . Atrial fibrillation 2010  . S/P minimally invasive mitral valve repair 07/05/2009    Complex valvuloplasty including quadrangular resection of posterior leaflet with CorMatrix patch augmentation of posterior leaflet and 30 mm Sorin Memo 3-D ring annuloplasty via right mini thoracotomy approach   . Pulmonary hypertension   . Cerebral infarction due to unspecified mechanism 10/31/2014    Right frontal small thrombotic stroke   . Acute ischemic stroke 10/31/2014  . CVA (cerebral infarction) 10/31/2014  . Upper GI  bleed 2010    Reportedly normal EGD and colonoscopy  . Postoperative anemia due to acute blood loss 10/27/2014  . Anemia   . Mitral stenosis   . Chronic combined systolic and diastolic CHF, NYHA class 2     Past Surgical History  Procedure Laterality Date  . Mitral valve repair  07/05/2009    quadrangular resection of posterior leaflet with  CorMatrix patch  . Abdominal hysterectomy    . Eye surgery Bilateral     cataract removed; lens implant  . Total knee arthroplasty Right 06/03/2014    Procedure: RIGHT TOTAL KNEE ARTHROPLASTY ;  Surgeon: Harvie Junior, MD;  Location: [facility] OR;  Service: Orthopedics;  Laterality: Right;  . Total knee arthroplasty Left 10/26/2014    Procedure: LEFT TOTAL KNEE ARTHROPLASTY;  Surgeon: Harvie Junior, MD;  Location: [facility] OR;  Service: Orthopedics;  Laterality: Left;  . Cholecystectomy    . Tee without cardioversion N/A 11/02/2014    Procedure: TRANSESOPHAGEAL ECHOCARDIOGRAM (TEE);  Surgeon: Vesta Mixer, MD;  Location: [facility] ENDOSCOPY;  Service: Cardiovascular;  Laterality: N/A;  . Patent foramen ovale closure      07/05/2009    Family History  Problem Relation Age of Onset  . Hypertension Mother   . Hyperlipidemia Mother   . Alzheimer's disease Mother   . Hypertension Father   . Cerebral aneurysm Father     History   Social History  . Marital Status: Widowed    Spouse Name: N/A    Number of Children: N/A  . Years of Education: N/A   Occupational History  . Not on file.   Social History Main Topics  . Smoking status: Former Smoker    Quit date: 12/20/2007  . Smokeless tobacco: Not on file  . Alcohol Use: No  . Drug Use: No  . Sexual Activity: Not on file   Other Topics Concern  . Not on file   Social History Narrative    Current Outpatient Prescriptions  Medication Sig Dispense Refill  . aspirin EC 81 MG tablet Take 81 mg by mouth daily.    . clopidogrel (PLAVIX) 75 MG tablet Take 1 tablet (75 mg total) by mouth daily. 30 tablet 6  . dicyclomine (BENTYL) 10 MG capsule Take 10 mg by mouth 3 (three) times daily before meals.     Marland Kitchen diltiazem (DILACOR XR) 240 MG 24 hr capsule Take 240 mg by mouth daily.    . furosemide (LASIX) 40 MG tablet Take 40 mg by mouth daily.    . metoprolol tartrate (LOPRESSOR) 25 MG tablet Take 25 mg by mouth 2 (two) times daily.     Marland Kitchen omeprazole  (PRILOSEC) 20 MG capsule Take 20 mg by mouth daily.    . potassium chloride (K-DUR) 10 MEQ tablet Take 1 tablet by mouth daily.  0  . simvastatin (ZOCOR) 20 MG tablet Take 20 mg by mouth at bedtime.      No current facility-administered medications for this visit.    No Known Allergies    Review of Systems:   General:  marginal appetite, good energy, no weight gain, no weight loss, no fever  Cardiac:  no chest pain with exertion, no chest pain at rest, + SOB with exertion, no resting SOB, no PND, + orthopnea, occasional palpitations, no arrhythmia, no atrial fibrillation, + LE edema, no dizzy spells, no syncope  Respiratory:  + exertional shortness of breath, no home oxygen, no productive cough, no dry cough, no bronchitis, no wheezing,  no hemoptysis, no asthma, no pain with inspiration or cough, + sleep apnea, no CPAP at night  GI:   no difficulty swallowing, no reflux, no frequent heartburn, no hiatal hernia, no abdominal pain, occasional constipation, no diarrhea, no hematochezia, no hematemesis, no melena  GU:   no dysuria,  no frequency, no urinary tract infection, no hematuria, no enlarged prostate, no kidney stones, no kidney disease  Vascular:  no pain suggestive of claudication, + pain in feet, no leg cramps, no varicose veins, no DVT, no non-healing foot ulcer  Neuro:   + recent stroke, no TIA's, no seizures, no headaches, no temporary blindness one eye,  no slurred speech, no peripheral neuropathy, no chronic pain, + mild instability of gait, no memory/cognitive dysfunction  Musculoskeletal: + arthritis, + joint swelling, no myalgias, mild difficulty walking, somewhat limited mobility but improving, walks using cane  Skin:   no rash, no itching, no skin infections, no pressure sores or ulcerations  Psych:   no anxiety, no depression, no nervousness, no unusual recent stress  Eyes:   no blurry vision, no floaters, no recent vision changes, does not wears glasses or  contacts  ENT:   no hearing loss, no loose or painful teeth, full set dentures  Hematologic:  no easy bruising, no abnormal bleeding, no clotting disorder, no frequent epistaxis  Endocrine:  no diabetes, does not check CBG's at home     Physical Exam:   BP 135/86 mmHg  Pulse 73  Resp 18  Ht 4\' 11"  (1.499 m)  Wt 134 lb (60.782 kg)  BMI 27.05 kg/m2  SpO2 97%  General:  Elderly but  well-appearing  HEENT:  Unremarkable   Neck:   no JVD, no bruits, no adenopathy   Chest:   clear to auscultation, symmetrical breath sounds, no wheezes, no rhonchi   CV:   RRR, no murmur   Abdomen:  soft, non-tender, no masses   Extremities:  warm, well-perfused, pulses not palpable, + mild LE edema  Rectal/GU  Deferred  Neuro:   Grossly non-focal and symmetrical throughout  Skin:   Clean and dry, no rashes, no breakdown   Diagnostic Tests:  Transthoracic Echocardiography  Patient:  Estelle GrumblesWood, [place] A MR #:    [id number] Study Date: 11/01/2014 Gender:   F Age:    [age] Height:   149.9 cm Weight:   61.2 kg BSA:    1.62 m^2 Pt. Status: Room:    4N32C  ADMITTING  Joines, Pollyann KennedyJerry Dale ATTENDING  Joines, Pollyann KennedyJerry Dale ORDERING   Joines, Pollyann KennedyJerry Dale REFERRING  Joines, Pollyann KennedyJerry Dale SONOGRAPHER Christy Little, RCS PERFORMING  [facility], Inpatient  cc:  ------------------------------------------------------------------- LV EF: 55% -  60%  ------------------------------------------------------------------- Indications:   CVA 436.  ------------------------------------------------------------------- History:  PMH:  Dyspnea. Atrial fibrillation. Risk factors: Former tobacco use. Dyslipidemia.  ------------------------------------------------------------------- Study Conclusions  - Left ventricle: The cavity size was normal. There was mild concentric hypertrophy. Systolic function was normal. The estimated ejection fraction was in the range of 55% to 60%.  Wall motion was normal; there were no regional wall motion abnormalities. - Aortic valve: Trileaflet; normal thickness, mildly calcified leaflets. There was moderate regurgitation. - Mitral valve: In the apical 4 chamber view the anterior mitral valve leaflet appears very thickened, suggestive of either a myoxomatous MV or a vegeation. Suggest TEE if clinically indicated for further evaluation. Prior procedures included surgical repair. An annular ring prosthesis was present. Moderate diffuse thickening. Moderate diffuse calcification, with moderate involvement of chords. The findings are  consistent with severe stenosis. Valve area by continuity equation (using LVOT flow): 1.22 cm^2. - Left atrium: The atrium was severely dilated. - Right atrium: The atrium was mildly dilated. - Tricuspid valve: There was moderate regurgitation. - Pulmonary arteries: PA peak pressure: 76 mm Hg (S).  Impressions:  - The right ventricular systolic pressure was increased consistent with severe pulmonary hypertension.  ------------------------------------------------------------------- Labs, prior tests, procedures, and surgery: Valve surgery.   Mitral valve repair.  Transthoracic echocardiography. M-mode, complete 2D, spectral Doppler, and color Doppler. Birthdate: Patient birthdate: 23-Apr-1934. Age: Patient is 81 yr old. Sex: Gender: female. BMI: 27.3 kg/m^2. Blood pressure:   154/88 Patient status: Inpatient. Study date: Study date: 11/01/2014. Study time: 09:53 AM. Location: Echo laboratory.  -------------------------------------------------------------------  ------------------------------------------------------------------- Left ventricle: The cavity size was normal. There was mild concentric hypertrophy. Systolic function was normal. The estimated ejection fraction was in the range of 55% to 60%. Wall motion was normal; there were no regional wall  motion abnormalities.  ------------------------------------------------------------------- Aortic valve:  Trileaflet; normal thickness, mildly calcified leaflets. Mobility was not restricted. Doppler: Transvalvular velocity was within the normal range. There was no stenosis. There was moderate regurgitation.  ------------------------------------------------------------------- Aorta: Aortic root: The aortic root was normal in size.  ------------------------------------------------------------------- Mitral valve: In the apical 4 chamber view the anterior mitral valve leaflet appears very thickened, suggestive of either a myoxomatous MV or a vegeation. Suggest TEE if clinically indicated for further evaluation. Prior procedures included surgical repair. An annular ring prosthesis was present. Moderate diffuse thickening. Moderate diffuse calcification, with moderate involvement of chords. Mobility was not restricted. Doppler:  The findings are consistent with severe stenosis.  There was no regurgitation.  Valve area by continuity equation (using LVOT flow): 1.22 cm^2. Indexed valve area by continuity equation (using LVOT flow): 0.75 cm^2/m^2.  Mean gradient (D): 17 mm Hg. Peak gradient (D): 18 mm Hg.  ------------------------------------------------------------------- Left atrium: The atrium was severely dilated.  ------------------------------------------------------------------- Right ventricle: The cavity size was normal. Wall thickness was normal. Systolic function was normal.  ------------------------------------------------------------------- Pulmonic valve:  Doppler: Transvalvular velocity was within the normal range. There was no evidence for stenosis.  ------------------------------------------------------------------- Tricuspid valve:  Structurally normal valve.  Doppler: Transvalvular velocity was within the normal range. There was moderate  regurgitation.  Mean gradient (D): 12 mm Hg.  ------------------------------------------------------------------- Pulmonary artery:  The main pulmonary artery was normal-sized. Systolic pressure was within the normal range.  ------------------------------------------------------------------- Right atrium: The atrium was mildly dilated.  ------------------------------------------------------------------- Pericardium: There was no pericardial effusion.  ------------------------------------------------------------------- Systemic veins: Inferior vena cava: The vessel was mildly dilated.  ------------------------------------------------------------------- Measurements  Left ventricle              Value     Reference LV ID, ED, PLAX chordal      (L)   37.3 mm    43 - 52 LV ID, ES, PLAX chordal          29  mm    23 - 38 LV fx shortening, PLAX chordal  (L)   22  %    >=29 LV PW thickness, ED            12.9 mm    --------- IVS/LV PW ratio, ED            0.95      <=1.3 Stroke volume, 2D             63  ml    --------- Stroke volume/bsa, 2D  39  ml/m^2  ---------  Ventricular septum            Value     Reference IVS thickness, ED             12.3 mm    ---------  LVOT                   Value     Reference LVOT ID, S                20  mm    --------- LVOT area                 3.14 cm^2   --------- LVOT peak velocity, S           112  cm/s   --------- LVOT mean velocity, S           71.7 cm/s   --------- LVOT VTI, S                20  cm    --------- LVOT peak gradient, S           5   mm Hg  ---------  Aortic valve               Value     Reference Aortic  regurg pressure half-time     290  ms    ---------  Aorta                   Value     Reference Aortic root ID, ED            31  mm    ---------  Left atrium                Value     Reference LA ID, A-P, ES              46  mm    --------- LA ID/bsa, A-P          (H)   2.85 cm/m^2  <=2.2 LA volume, S               82  ml    --------- LA volume/bsa, S             50.7 ml/m^2  --------- LA volume, ES, 1-p A4C          107  ml    --------- LA volume/bsa, ES, 1-p A4C        66.2 ml/m^2  --------- LA volume, ES, 1-p A2C          60  ml    --------- LA volume/bsa, ES, 1-p A2C        37.1 ml/m^2  ---------  Mitral valve               Value     Reference Mitral E-wave peak velocity        214  cm/s   --------- Mitral A-wave peak velocity        214  cm/s   --------- Mitral mean velocity, D          199  cm/s   --------- Mitral deceleration time     (H)   269  ms    150 - 230 Mitral mean gradient, D          17  mm Hg  --------- Mitral peak gradient, D  18  mm Hg  --------- Mitral E/A ratio, peak          1       --------- Mitral valve area, LVOT          1.22 cm^2   --------- continuity Mitral valve area/bsa, LVOT        0.75 cm^2/m^2 --------- continuity Mitral annulus VTI, D           51.5 cm    ---------  Pulmonary arteries            Value     Reference PA pressure, S, DP        (H)   76  mm Hg  <=30  Tricuspid valve              Value     Reference Tricuspid mean velocity, D        169  cm/s   --------- Tricuspid mean gradient, D        12  mm Hg   --------- Tricuspid VTI at annulus, D        428  mm    --------- Tricuspid regurg peak velocity      413  cm/s   --------- Tricuspid peak RV-RA gradient       68  mm Hg  ---------  Systemic veins              Value     Reference Estimated CVP               8   mm Hg  ---------  Right ventricle              Value     Reference RV pressure, S, DP        (H)   76  mm Hg  <=30  Legend: (L) and (H) mark values outside specified reference range.  ------------------------------------------------------------------- Prepared and Electronically Authenticated by  Armanda Magic, MD 2015-11-17T14:24:50    Transesophageal Echocardiography  (Report amended )  Patient:  Wynelle, Dreier MR #:    [id number] Study Date: 11/02/2014 Gender:   F Age:    89 Height:   149.9 cm Weight:   61.4 kg BSA:    1.62 m^2 Pt. Status: Room:    4N32C  PERFORMING  Kristeen Miss, M.D. ADMITTING  Joines, Pollyann Kennedy ATTENDING  Joines, Idelle Crouch REFERRING  Dwana Melena SONOGRAPHER Arvil Chaco  cc:  ------------------------------------------------------------------- LV EF: 35% -  40%  ------------------------------------------------------------------- Indications:   MVD [non-rheumatic] 424.0.  ------------------------------------------------------------------- History:  PMH: History of mitral valve repair. Stroke.  ------------------------------------------------------------------- Study Conclusions  - Left ventricle: Systolic function was moderately reduced. The estimated ejection fraction was in the range of 35% to 40%. - Aortic valve: No evidence of vegetation. There was mild regurgitation. - Mitral valve: Prior procedures included surgical repair. - Left atrium: No evidence of thrombus in the atrial cavity  or appendage. - Atrial septum: No defect or patent foramen ovale was identified. - Tricuspid valve: No evidence of vegetation. There was moderate regurgitation.  Diagnostic transesophageal echocardiography. 2D and color Doppler. Birthdate: Patient birthdate: 04-Jul-1934. Age: Patient is 81 yr old. Sex: Gender: female.  BMI: 27.3 kg/m^2. Blood pressure: 140/79 Patient status: Outpatient. Study date: Study date: 11/02/2014. Study time: 09:26 AM. Location: Endoscopy.  -------------------------------------------------------------------  ------------------------------------------------------------------- Left ventricle: Systolic function was moderately reduced. The estimated ejection fraction was in the range of 35% to 40%.  -------------------------------------------------------------------  Aortic valve:  Structurally normal valve.  Cusp separation was normal. No evidence of vegetation. Doppler: There was mild regurgitation.  ------------------------------------------------------------------- Aorta: The aorta was mildly calcified.  ------------------------------------------------------------------- Mitral valve: The patient is s/p mitral valve repair for MVP and severe MR. There is now restricted valve mobility and a gradient of 13 mm Hg across the MV.  Prior procedures included surgical repair. Doppler:   Mean gradient (D): 13 mm Hg.  ------------------------------------------------------------------- Left atrium:  No evidence of thrombus in the atrial cavity or appendage.  ------------------------------------------------------------------- Atrial septum: No defect or patent foramen ovale was identified.  ------------------------------------------------------------------- Pulmonic valve:  The valve appears to be grossly normal. Doppler: There was mild  regurgitation.  ------------------------------------------------------------------- Tricuspid valve:  Structurally normal valve.  Leaflet separation was normal. No evidence of vegetation. Doppler: There was moderate regurgitation.  ------------------------------------------------------------------- Post procedure conclusions Ascending Aorta:  - The aorta was mildly calcified.  ------------------------------------------------------------------- Measurements  Mitral valve         Value Mitral mean velocity, D   176  cm/s Mitral mean gradient, D   13  mm Hg Mitral annulus VTI, D    39.8 cm  Legend: (L) and (H) mark values outside specified reference range.  ------------------------------------------------------------------- Deno Lunger, M.D. 2015-11-18T17:55:58       Impression:  The patient has been referred for possible redo mitral valve replacement having recently suffered a minor embolic stroke following left total knee replacement. Transthoracic and transesophageal echocardiograms did not reveal any source of embolization and there is no reported history of atrial fibrillation.  She is currently anticoagulated using aspirin and Plavix because of her recent stroke.  The patient describes stable long-standing symptoms of exertional shortness of breath consistent with chronic combined systolic and diastolic congestive heart failure, New York Heart Association functional class II. She states that symptoms have become somewhat worse since her recent knee replacement and stroke, and this coincides with an acute exacerbation of chronic anemia and volume overload.  However, she denies resting shortness of breath and she states that her breathing has been improving over the last 2 weeks.  I have personally reviewed the patient's recent transthoracic and transesophageal echocardiograms. The patient has intact mitral valve repair with no  significant mitral regurgitation. She does have at least mild mitral stenosis (which is expected following repair) but I am not convinced that the mitral stenosis is severe.  On careful review of the recent transesophageal echocardiogram the degree of valve opening appears similar in comparison with the transesophageal echocardiogram performed at the time of surgery in 2010.  There was mild to moderate aortic insufficiency (which affects mitral valve opening and transmitral flow), mild to moderate systolic and diastolic dysfunction of the LV, and moderate tricuspid regurgitation.   Furthermore, the assessment of transmitral gradient by Doppler may be affected by both the location where the Doppler signal was recorded and the presence of increased flow associated her early postoperative state and anemia.   Plan:  I favor continued medical therapy to include diuretics as prescribed by Dr. Jacinto Halim. I have recommended that the patient monitor her weight on a daily basis to facilitate appropriate titration of therapy.  I have also recommended that the patient begin taking an iron supplement, and at some point she should have follow-up CBC to make sure that her hemoglobin is recovering appropriately.  Finally, it might be reasonable to perform a Holter monitor or other outpatient cardiac monitor to rule out the possibility of paroxysmal atrial fibrillation as  a source of embolization and her recent stroke.  If the patient does not improve with medical therapy and symptoms of congestive heart failure get worse, I would favor repeat TEE and left and right heart catheterization with transseptal puncture for simultaneous measurement of left atrial and left ventricular pressure.  We will have the patient return in 6 months to see how she is getting along.   I spent in excess of 45 minutes during the conduct of this office consultation and >50% of this time involved direct face-to-face encounter with the patient for  counseling and/or coordination of their care.   Salvatore Decent. Cornelius Moras, MD 12/20/2014 2:54 PM

## 2014-12-20 NOTE — Patient Instructions (Signed)
Continue taking lasix (furosemide) as prescribed by Dr Jacinto HalimGanji  Record your weight on a daily basis and keep a log to report to Dr Jacinto HalimGanji  Consult with Dr Jacinto HalimGanji regarding outpatient heart rhythm monitor test to look for possible atrial fibrillation  Begin taking iron supplementation on a daily basis.  You may need a stool softener while taking iron

## 2015-01-04 ENCOUNTER — Ambulatory Visit: Payer: Self-pay | Admitting: Neurology

## 2015-02-21 DIAGNOSIS — D649 Anemia, unspecified: Secondary | ICD-10-CM | POA: Diagnosis not present

## 2015-02-21 DIAGNOSIS — I10 Essential (primary) hypertension: Secondary | ICD-10-CM | POA: Diagnosis not present

## 2015-03-28 DIAGNOSIS — Z96652 Presence of left artificial knee joint: Secondary | ICD-10-CM | POA: Diagnosis not present

## 2015-03-28 DIAGNOSIS — Z09 Encounter for follow-up examination after completed treatment for conditions other than malignant neoplasm: Secondary | ICD-10-CM | POA: Diagnosis not present

## 2015-05-24 DIAGNOSIS — I10 Essential (primary) hypertension: Secondary | ICD-10-CM | POA: Diagnosis not present

## 2015-05-24 DIAGNOSIS — D649 Anemia, unspecified: Secondary | ICD-10-CM | POA: Diagnosis not present

## 2015-06-26 ENCOUNTER — Ambulatory Visit: Payer: Medicare Other | Admitting: Thoracic Surgery (Cardiothoracic Vascular Surgery)

## 2015-06-27 DIAGNOSIS — M25562 Pain in left knee: Secondary | ICD-10-CM | POA: Diagnosis not present

## 2015-06-27 DIAGNOSIS — M25561 Pain in right knee: Secondary | ICD-10-CM | POA: Diagnosis not present

## 2015-07-17 DIAGNOSIS — R0602 Shortness of breath: Secondary | ICD-10-CM | POA: Diagnosis not present

## 2015-07-17 DIAGNOSIS — I672 Cerebral atherosclerosis: Secondary | ICD-10-CM | POA: Diagnosis not present

## 2015-07-17 DIAGNOSIS — I272 Other secondary pulmonary hypertension: Secondary | ICD-10-CM | POA: Diagnosis not present

## 2015-07-17 DIAGNOSIS — I342 Nonrheumatic mitral (valve) stenosis: Secondary | ICD-10-CM | POA: Diagnosis not present

## 2015-08-03 DIAGNOSIS — Z9889 Other specified postprocedural states: Secondary | ICD-10-CM | POA: Diagnosis not present

## 2015-08-03 DIAGNOSIS — I342 Nonrheumatic mitral (valve) stenosis: Secondary | ICD-10-CM | POA: Diagnosis not present

## 2015-08-23 DIAGNOSIS — I10 Essential (primary) hypertension: Secondary | ICD-10-CM | POA: Diagnosis not present

## 2015-09-25 DIAGNOSIS — Z6828 Body mass index (BMI) 28.0-28.9, adult: Secondary | ICD-10-CM | POA: Diagnosis not present

## 2015-09-25 DIAGNOSIS — I10 Essential (primary) hypertension: Secondary | ICD-10-CM | POA: Diagnosis not present

## 2015-12-21 DIAGNOSIS — M1712 Unilateral primary osteoarthritis, left knee: Secondary | ICD-10-CM | POA: Diagnosis not present

## 2015-12-21 DIAGNOSIS — M1711 Unilateral primary osteoarthritis, right knee: Secondary | ICD-10-CM | POA: Diagnosis not present

## 2016-01-01 DIAGNOSIS — R5383 Other fatigue: Secondary | ICD-10-CM | POA: Diagnosis not present

## 2016-01-01 DIAGNOSIS — E785 Hyperlipidemia, unspecified: Secondary | ICD-10-CM | POA: Diagnosis not present

## 2016-01-01 DIAGNOSIS — I10 Essential (primary) hypertension: Secondary | ICD-10-CM | POA: Diagnosis not present

## 2016-01-01 DIAGNOSIS — Z0000 Encounter for general adult medical examination without abnormal findings: Secondary | ICD-10-CM | POA: Diagnosis not present

## 2016-01-04 DIAGNOSIS — R799 Abnormal finding of blood chemistry, unspecified: Secondary | ICD-10-CM | POA: Diagnosis not present

## 2016-03-04 DIAGNOSIS — I10 Essential (primary) hypertension: Secondary | ICD-10-CM | POA: Diagnosis not present

## 2016-03-04 DIAGNOSIS — D649 Anemia, unspecified: Secondary | ICD-10-CM | POA: Diagnosis not present

## 2016-04-17 DIAGNOSIS — I10 Essential (primary) hypertension: Secondary | ICD-10-CM | POA: Diagnosis not present

## 2016-06-03 DIAGNOSIS — I10 Essential (primary) hypertension: Secondary | ICD-10-CM | POA: Diagnosis not present

## 2016-07-30 DIAGNOSIS — I10 Essential (primary) hypertension: Secondary | ICD-10-CM | POA: Diagnosis not present

## 2016-07-30 DIAGNOSIS — M1990 Unspecified osteoarthritis, unspecified site: Secondary | ICD-10-CM | POA: Diagnosis not present

## 2016-09-25 DIAGNOSIS — Z23 Encounter for immunization: Secondary | ICD-10-CM | POA: Diagnosis not present

## 2016-09-25 DIAGNOSIS — I10 Essential (primary) hypertension: Secondary | ICD-10-CM | POA: Diagnosis not present

## 2016-12-04 DIAGNOSIS — D649 Anemia, unspecified: Secondary | ICD-10-CM | POA: Diagnosis not present

## 2016-12-04 DIAGNOSIS — E785 Hyperlipidemia, unspecified: Secondary | ICD-10-CM | POA: Diagnosis not present

## 2016-12-04 DIAGNOSIS — I10 Essential (primary) hypertension: Secondary | ICD-10-CM | POA: Diagnosis not present

## 2017-03-03 DIAGNOSIS — D649 Anemia, unspecified: Secondary | ICD-10-CM | POA: Diagnosis not present

## 2017-03-03 DIAGNOSIS — I10 Essential (primary) hypertension: Secondary | ICD-10-CM | POA: Diagnosis not present

## 2017-03-03 DIAGNOSIS — R001 Bradycardia, unspecified: Secondary | ICD-10-CM | POA: Diagnosis not present

## 2017-06-02 DIAGNOSIS — R5382 Chronic fatigue, unspecified: Secondary | ICD-10-CM | POA: Diagnosis not present

## 2017-06-02 DIAGNOSIS — I10 Essential (primary) hypertension: Secondary | ICD-10-CM | POA: Diagnosis not present

## 2017-08-25 DIAGNOSIS — J069 Acute upper respiratory infection, unspecified: Secondary | ICD-10-CM | POA: Diagnosis not present

## 2017-08-25 DIAGNOSIS — R05 Cough: Secondary | ICD-10-CM | POA: Diagnosis not present

## 2018-01-05 DIAGNOSIS — D649 Anemia, unspecified: Secondary | ICD-10-CM | POA: Diagnosis not present

## 2018-01-05 DIAGNOSIS — E785 Hyperlipidemia, unspecified: Secondary | ICD-10-CM | POA: Diagnosis not present

## 2018-01-05 DIAGNOSIS — I10 Essential (primary) hypertension: Secondary | ICD-10-CM | POA: Diagnosis not present

## 2018-04-06 DIAGNOSIS — E785 Hyperlipidemia, unspecified: Secondary | ICD-10-CM | POA: Diagnosis not present

## 2018-04-06 DIAGNOSIS — I10 Essential (primary) hypertension: Secondary | ICD-10-CM | POA: Diagnosis not present

## 2018-07-08 DIAGNOSIS — K219 Gastro-esophageal reflux disease without esophagitis: Secondary | ICD-10-CM | POA: Diagnosis not present

## 2018-07-08 DIAGNOSIS — I10 Essential (primary) hypertension: Secondary | ICD-10-CM | POA: Diagnosis not present

## 2018-07-08 DIAGNOSIS — E785 Hyperlipidemia, unspecified: Secondary | ICD-10-CM | POA: Diagnosis not present

## 2018-09-18 ENCOUNTER — Inpatient Hospital Stay (HOSPITAL_COMMUNITY)
Admission: EM | Admit: 2018-09-18 | Discharge: 2018-10-16 | DRG: 306 | Disposition: E | Payer: Medicare Other | Attending: Oncology | Admitting: Oncology

## 2018-09-18 ENCOUNTER — Other Ambulatory Visit: Payer: Self-pay

## 2018-09-18 ENCOUNTER — Encounter (HOSPITAL_COMMUNITY): Payer: Self-pay | Admitting: Emergency Medicine

## 2018-09-18 ENCOUNTER — Emergency Department (HOSPITAL_COMMUNITY): Payer: Medicare Other

## 2018-09-18 ENCOUNTER — Inpatient Hospital Stay (HOSPITAL_COMMUNITY): Payer: Medicare Other

## 2018-09-18 DIAGNOSIS — I361 Nonrheumatic tricuspid (valve) insufficiency: Secondary | ICD-10-CM | POA: Diagnosis not present

## 2018-09-18 DIAGNOSIS — R0602 Shortness of breath: Secondary | ICD-10-CM

## 2018-09-18 DIAGNOSIS — K219 Gastro-esophageal reflux disease without esophagitis: Secondary | ICD-10-CM | POA: Diagnosis present

## 2018-09-18 DIAGNOSIS — R112 Nausea with vomiting, unspecified: Secondary | ICD-10-CM | POA: Diagnosis not present

## 2018-09-18 DIAGNOSIS — Z79899 Other long term (current) drug therapy: Secondary | ICD-10-CM

## 2018-09-18 DIAGNOSIS — Z8249 Family history of ischemic heart disease and other diseases of the circulatory system: Secondary | ICD-10-CM | POA: Diagnosis not present

## 2018-09-18 DIAGNOSIS — R350 Frequency of micturition: Secondary | ICD-10-CM | POA: Diagnosis not present

## 2018-09-18 DIAGNOSIS — I5040 Unspecified combined systolic (congestive) and diastolic (congestive) heart failure: Secondary | ICD-10-CM | POA: Diagnosis not present

## 2018-09-18 DIAGNOSIS — Z8679 Personal history of other diseases of the circulatory system: Secondary | ICD-10-CM | POA: Diagnosis not present

## 2018-09-18 DIAGNOSIS — I272 Pulmonary hypertension, unspecified: Secondary | ICD-10-CM

## 2018-09-18 DIAGNOSIS — Z7982 Long term (current) use of aspirin: Secondary | ICD-10-CM

## 2018-09-18 DIAGNOSIS — E8770 Fluid overload, unspecified: Secondary | ICD-10-CM | POA: Diagnosis not present

## 2018-09-18 DIAGNOSIS — Z87891 Personal history of nicotine dependence: Secondary | ICD-10-CM

## 2018-09-18 DIAGNOSIS — Z743 Need for continuous supervision: Secondary | ICD-10-CM | POA: Diagnosis not present

## 2018-09-18 DIAGNOSIS — E872 Acidosis: Secondary | ICD-10-CM | POA: Diagnosis not present

## 2018-09-18 DIAGNOSIS — Z66 Do not resuscitate: Secondary | ICD-10-CM | POA: Diagnosis present

## 2018-09-18 DIAGNOSIS — E785 Hyperlipidemia, unspecified: Secondary | ICD-10-CM | POA: Diagnosis not present

## 2018-09-18 DIAGNOSIS — J209 Acute bronchitis, unspecified: Secondary | ICD-10-CM

## 2018-09-18 DIAGNOSIS — I5043 Acute on chronic combined systolic (congestive) and diastolic (congestive) heart failure: Secondary | ICD-10-CM | POA: Diagnosis not present

## 2018-09-18 DIAGNOSIS — I2510 Atherosclerotic heart disease of native coronary artery without angina pectoris: Secondary | ICD-10-CM | POA: Diagnosis present

## 2018-09-18 DIAGNOSIS — Z952 Presence of prosthetic heart valve: Secondary | ICD-10-CM

## 2018-09-18 DIAGNOSIS — R011 Cardiac murmur, unspecified: Secondary | ICD-10-CM | POA: Diagnosis not present

## 2018-09-18 DIAGNOSIS — N179 Acute kidney failure, unspecified: Secondary | ICD-10-CM | POA: Diagnosis not present

## 2018-09-18 DIAGNOSIS — Z8774 Personal history of (corrected) congenital malformations of heart and circulatory system: Secondary | ICD-10-CM

## 2018-09-18 DIAGNOSIS — I110 Hypertensive heart disease with heart failure: Secondary | ICD-10-CM | POA: Diagnosis present

## 2018-09-18 DIAGNOSIS — I2729 Other secondary pulmonary hypertension: Secondary | ICD-10-CM | POA: Diagnosis not present

## 2018-09-18 DIAGNOSIS — I342 Nonrheumatic mitral (valve) stenosis: Secondary | ICD-10-CM | POA: Diagnosis not present

## 2018-09-18 DIAGNOSIS — I058 Other rheumatic mitral valve diseases: Secondary | ICD-10-CM | POA: Diagnosis not present

## 2018-09-18 DIAGNOSIS — I491 Atrial premature depolarization: Secondary | ICD-10-CM | POA: Diagnosis not present

## 2018-09-18 DIAGNOSIS — R0689 Other abnormalities of breathing: Secondary | ICD-10-CM | POA: Diagnosis not present

## 2018-09-18 DIAGNOSIS — Z7902 Long term (current) use of antithrombotics/antiplatelets: Secondary | ICD-10-CM

## 2018-09-18 DIAGNOSIS — Z8673 Personal history of transient ischemic attack (TIA), and cerebral infarction without residual deficits: Secondary | ICD-10-CM

## 2018-09-18 DIAGNOSIS — I4891 Unspecified atrial fibrillation: Secondary | ICD-10-CM | POA: Diagnosis not present

## 2018-09-18 DIAGNOSIS — Z96653 Presence of artificial knee joint, bilateral: Secondary | ICD-10-CM | POA: Diagnosis present

## 2018-09-18 DIAGNOSIS — J810 Acute pulmonary edema: Secondary | ICD-10-CM

## 2018-09-18 DIAGNOSIS — R0902 Hypoxemia: Secondary | ICD-10-CM | POA: Diagnosis not present

## 2018-09-18 DIAGNOSIS — I050 Rheumatic mitral stenosis: Secondary | ICD-10-CM | POA: Diagnosis not present

## 2018-09-18 DIAGNOSIS — Z96651 Presence of right artificial knee joint: Secondary | ICD-10-CM | POA: Diagnosis not present

## 2018-09-18 DIAGNOSIS — J90 Pleural effusion, not elsewhere classified: Secondary | ICD-10-CM | POA: Diagnosis not present

## 2018-09-18 DIAGNOSIS — J811 Chronic pulmonary edema: Secondary | ICD-10-CM | POA: Diagnosis not present

## 2018-09-18 LAB — URINALYSIS, ROUTINE W REFLEX MICROSCOPIC
BILIRUBIN URINE: NEGATIVE
GLUCOSE, UA: NEGATIVE mg/dL
HGB URINE DIPSTICK: NEGATIVE
KETONES UR: NEGATIVE mg/dL
NITRITE: NEGATIVE
Protein, ur: NEGATIVE mg/dL
Specific Gravity, Urine: 1.033 — ABNORMAL HIGH (ref 1.005–1.030)
pH: 5 (ref 5.0–8.0)

## 2018-09-18 LAB — LIPASE, BLOOD: LIPASE: 29 U/L (ref 11–51)

## 2018-09-18 LAB — CBC WITH DIFFERENTIAL/PLATELET
ABS IMMATURE GRANULOCYTES: 0 10*3/uL (ref 0.0–0.1)
Basophils Absolute: 0 10*3/uL (ref 0.0–0.1)
Basophils Relative: 0 %
Eosinophils Absolute: 0 10*3/uL (ref 0.0–0.7)
Eosinophils Relative: 0 %
HCT: 44.3 % (ref 36.0–46.0)
Hemoglobin: 13.9 g/dL (ref 12.0–15.0)
IMMATURE GRANULOCYTES: 0 %
LYMPHS PCT: 28 %
Lymphs Abs: 2.2 10*3/uL (ref 0.7–4.0)
MCH: 28.4 pg (ref 26.0–34.0)
MCHC: 31.4 g/dL (ref 30.0–36.0)
MCV: 90.6 fL (ref 78.0–100.0)
Monocytes Absolute: 0.7 10*3/uL (ref 0.1–1.0)
Monocytes Relative: 9 %
NEUTROS ABS: 5 10*3/uL (ref 1.7–7.7)
NEUTROS PCT: 63 %
Platelets: 218 10*3/uL (ref 150–400)
RBC: 4.89 MIL/uL (ref 3.87–5.11)
RDW: 13.6 % (ref 11.5–15.5)
WBC: 8 10*3/uL (ref 4.0–10.5)

## 2018-09-18 LAB — COMPREHENSIVE METABOLIC PANEL
ALT: 8 U/L (ref 0–44)
AST: 22 U/L (ref 15–41)
Albumin: 3.9 g/dL (ref 3.5–5.0)
Alkaline Phosphatase: 63 U/L (ref 38–126)
Anion gap: 11 (ref 5–15)
BUN: 16 mg/dL (ref 8–23)
CALCIUM: 9.3 mg/dL (ref 8.9–10.3)
CHLORIDE: 104 mmol/L (ref 98–111)
CO2: 23 mmol/L (ref 22–32)
Creatinine, Ser: 1.23 mg/dL — ABNORMAL HIGH (ref 0.44–1.00)
GFR calc Af Amer: 46 mL/min — ABNORMAL LOW (ref >60)
GFR, EST NON AFRICAN AMERICAN: 40 mL/min — AB (ref >60)
Glucose, Bld: 109 mg/dL — ABNORMAL HIGH (ref 70–99)
Potassium: 3.8 mmol/L (ref 3.5–5.1)
SODIUM: 138 mmol/L (ref 135–145)
Total Bilirubin: 0.8 mg/dL (ref 0.3–1.2)
Total Protein: 6.7 g/dL (ref 6.5–8.1)

## 2018-09-18 LAB — I-STAT CG4 LACTIC ACID, ED
LACTIC ACID, VENOUS: 2.35 mmol/L — AB (ref 0.5–1.9)
Lactic Acid, Venous: 2.13 mmol/L (ref 0.5–1.9)

## 2018-09-18 LAB — ECHOCARDIOGRAM COMPLETE
HEIGHTINCHES: 59 in
WEIGHTICAEL: 2080 [oz_av]

## 2018-09-18 LAB — I-STAT TROPONIN, ED
TROPONIN I, POC: 0.05 ng/mL (ref 0.00–0.08)
Troponin i, poc: 0.07 ng/mL (ref 0.00–0.08)

## 2018-09-18 LAB — BRAIN NATRIURETIC PEPTIDE: B Natriuretic Peptide: 693.3 pg/mL — ABNORMAL HIGH (ref 0.0–100.0)

## 2018-09-18 MED ORDER — SIMVASTATIN 20 MG PO TABS
20.0000 mg | ORAL_TABLET | Freq: Every day | ORAL | Status: DC
  Administered 2018-09-18 – 2018-09-19 (×2): 20 mg via ORAL
  Filled 2018-09-18 (×2): qty 1

## 2018-09-18 MED ORDER — ORAL CARE MOUTH RINSE
15.0000 mL | Freq: Two times a day (BID) | OROMUCOSAL | Status: DC
  Administered 2018-09-18: 15 mL via OROMUCOSAL

## 2018-09-18 MED ORDER — CLOPIDOGREL BISULFATE 75 MG PO TABS
75.0000 mg | ORAL_TABLET | Freq: Every day | ORAL | Status: DC
  Administered 2018-09-19: 75 mg via ORAL
  Filled 2018-09-18: qty 1

## 2018-09-18 MED ORDER — ENOXAPARIN SODIUM 30 MG/0.3ML ~~LOC~~ SOLN
30.0000 mg | SUBCUTANEOUS | Status: DC
  Administered 2018-09-18 – 2018-09-19 (×2): 30 mg via SUBCUTANEOUS
  Filled 2018-09-18 (×2): qty 0.3

## 2018-09-18 MED ORDER — ALBUTEROL SULFATE (2.5 MG/3ML) 0.083% IN NEBU
2.5000 mg | INHALATION_SOLUTION | Freq: Four times a day (QID) | RESPIRATORY_TRACT | Status: DC | PRN
  Administered 2018-09-19: 2.5 mg via RESPIRATORY_TRACT
  Filled 2018-09-18: qty 3

## 2018-09-18 MED ORDER — METOPROLOL TARTRATE 25 MG PO TABS: 25.0000 mg | ORAL_TABLET | Freq: Two times a day (BID) | ORAL | Status: DC

## 2018-09-18 MED ORDER — DILTIAZEM HCL ER COATED BEADS 240 MG PO CP24
240.0000 mg | ORAL_CAPSULE | Freq: Every day | ORAL | Status: DC
  Administered 2018-09-19: 240 mg via ORAL
  Filled 2018-09-18: qty 1

## 2018-09-18 MED ORDER — FUROSEMIDE 10 MG/ML IJ SOLN
20.0000 mg | Freq: Once | INTRAMUSCULAR | Status: AC
  Administered 2018-09-18: 20 mg via INTRAVENOUS
  Filled 2018-09-18: qty 2

## 2018-09-18 MED ORDER — ACETAMINOPHEN 650 MG RE SUPP: 650.0000 mg | Freq: Four times a day (QID) | RECTAL | Status: DC | PRN

## 2018-09-18 MED ORDER — METOPROLOL TARTRATE 50 MG PO TABS
50.0000 mg | ORAL_TABLET | Freq: Two times a day (BID) | ORAL | Status: DC
  Administered 2018-09-18: 50 mg via ORAL
  Filled 2018-09-18: qty 1

## 2018-09-18 MED ORDER — ALBUTEROL SULFATE (2.5 MG/3ML) 0.083% IN NEBU: 5.0000 mg | INHALATION_SOLUTION | Freq: Once | RESPIRATORY_TRACT | Status: DC

## 2018-09-18 MED ORDER — METOPROLOL TARTRATE 50 MG PO TABS
50.0000 mg | ORAL_TABLET | Freq: Two times a day (BID) | ORAL | Status: DC
  Administered 2018-09-19 (×2): 50 mg via ORAL
  Filled 2018-09-18 (×2): qty 1

## 2018-09-18 MED ORDER — METOPROLOL TARTRATE 25 MG/10 ML ORAL SUSPENSION
25.0000 mg | Freq: Two times a day (BID) | ORAL | Status: DC
  Administered 2018-09-18: 25 mg via ORAL
  Filled 2018-09-18 (×2): qty 10

## 2018-09-18 MED ORDER — FUROSEMIDE 10 MG/ML IJ SOLN
40.0000 mg | Freq: Once | INTRAMUSCULAR | Status: AC
  Administered 2018-09-18: 40 mg via INTRAVENOUS
  Filled 2018-09-18: qty 4

## 2018-09-18 MED ORDER — DILTIAZEM HCL ER 240 MG PO CP24: 240.0000 mg | ORAL_CAPSULE | Freq: Every day | ORAL | Status: DC

## 2018-09-18 MED ORDER — SENNOSIDES-DOCUSATE SODIUM 8.6-50 MG PO TABS: 1.0000 | ORAL_TABLET | Freq: Every evening | ORAL | Status: DC | PRN

## 2018-09-18 MED ORDER — HYDRALAZINE HCL 50 MG PO TABS
75.0000 mg | ORAL_TABLET | Freq: Every day | ORAL | Status: DC
  Administered 2018-09-18 – 2018-09-19 (×2): 75 mg via ORAL
  Filled 2018-09-18: qty 1
  Filled 2018-09-18: qty 3

## 2018-09-18 MED ORDER — INFLUENZA VAC SPLIT HIGH-DOSE 0.5 ML IM SUSY: 0.5000 mL | PREFILLED_SYRINGE | INTRAMUSCULAR | Status: DC | PRN

## 2018-09-18 MED ORDER — ACETAMINOPHEN 325 MG PO TABS: 650.0000 mg | ORAL_TABLET | Freq: Four times a day (QID) | ORAL | Status: DC | PRN

## 2018-09-18 NOTE — Progress Notes (Signed)
  Echocardiogram 2D Echocardiogram has been performed.  [name] 22-Sep-2018, 3:27 PM

## 2018-09-18 NOTE — ED Notes (Signed)
Family updated.

## 2018-09-18 NOTE — ED Provider Notes (Signed)
[facility] EMERGENCY DEPARTMENT Provider Note   CSN: [id number] Arrival date & time: 09/15/2018  4098     History   Chief Complaint Chief Complaint  Patient presents with  . Shortness of Breath    HPI [name] is a 84 y.o. female.  The history is provided by the patient and medical records. No language interpreter was used.  Shortness of Breath  This is a recurrent problem. The average episode lasts 2 days. The problem occurs continuously.The current episode started yesterday. The problem has been gradually improving. Associated symptoms include cough, sputum production, wheezing and leg swelling. Pertinent negatives include no fever, no headaches, no rhinorrhea, no neck pain, no hemoptysis, no chest pain, no syncope, no vomiting, no abdominal pain, no rash and no leg pain. She has tried nothing for the symptoms. The treatment provided no relief. Associated medical issues include CAD and heart failure.    Past Medical History:  Diagnosis Date  . Acute ischemic stroke 10/31/2014  . Anemia   . Arthritis   . Atrial fibrillation 2010  . Cerebral infarction due to unspecified mechanism 10/31/2014   Right frontal small thrombotic stroke   . Chronic combined systolic and diastolic CHF, NYHA class 2   . CVA (cerebral infarction) 10/31/2014  . Family history of anesthesia complication    Daughter; takes a long time to wake up  . Frequency of urination    at time  . GERD (gastroesophageal reflux disease)   . Hyperlipemia   . Hypertension   . Mitral stenosis   . Postoperative anemia due to acute blood loss 10/27/2014  . Pulmonary hypertension   . S/P minimally invasive mitral valve repair 07/05/2009   Complex valvuloplasty including quadrangular resection of posterior leaflet with CorMatrix patch augmentation of posterior leaflet and 30 mm Sorin Memo 3-D ring annuloplasty via right mini thoracotomy approach   . Shortness of breath    while walking  . Upper GI  bleed 2010   Reportedly normal EGD and colonoscopy    Patient Active Problem List   Diagnosis Date Noted  . Hypertension   . Pulmonary hypertension (HCC)   . Upper GI bleed   . Anemia   . Mitral stenosis   . Chronic combined systolic and diastolic CHF, NYHA class 2 (HCC)   . Cerebral infarction due to unspecified mechanism   . CVA (cerebral infarction) 10/31/2014  . Acute ischemic stroke (HCC) 10/31/2014  . Elevated troponin   . Absolute anemia   . Hypokalemia 10/27/2014  . Postoperative anemia due to acute blood loss 10/27/2014  . Primary osteoarthritis of left knee 10/26/2014  . Status post total right knee replacement 10/26/2014  . Osteoarthritis of right knee 06/03/2014  . S/P minimally invasive mitral valve repair 07/05/2009    Past Surgical History:  Procedure Laterality Date  . ABDOMINAL HYSTERECTOMY    . CHOLECYSTECTOMY    . EYE SURGERY Bilateral    cataract removed; lens implant  . MITRAL VALVE REPAIR  07/05/2009   quadrangular resection of posterior leaflet with CorMatrix patch  . PATENT FORAMEN OVALE CLOSURE     07/05/2009  . TEE WITHOUT CARDIOVERSION N/A 11/02/2014   Procedure: TRANSESOPHAGEAL ECHOCARDIOGRAM (TEE);  Surgeon: Vesta Mixer, MD;  Location: [facility] ENDOSCOPY;  Service: Cardiovascular;  Laterality: N/A;  . TOTAL KNEE ARTHROPLASTY Right 06/03/2014   Procedure: RIGHT TOTAL KNEE ARTHROPLASTY ;  Surgeon: Harvie Junior, MD;  Location: [facility] OR;  Service: Orthopedics;  Laterality: Right;  . TOTAL  KNEE ARTHROPLASTY Left 10/26/2014   Procedure: LEFT TOTAL KNEE ARTHROPLASTY;  Surgeon: Harvie Junior, MD;  Location: [facility] OR;  Service: Orthopedics;  Laterality: Left;     OB History   None      Home Medications    Prior to Admission medications   Medication Sig Start Date End Date Taking? Authorizing Provider  aspirin EC 81 MG tablet Take 81 mg by mouth daily.    [provider]  clopidogrel (PLAVIX) 75 MG tablet Take 1 tablet (75 mg total) by mouth  daily. 11/02/14   Burns, Tinnie Gens, MD  dicyclomine (BENTYL) 10 MG capsule Take 10 mg by mouth 3 (three) times daily before meals.     [provider]  diltiazem (DILACOR XR) 240 MG 24 hr capsule Take 240 mg by mouth daily.    [provider]  furosemide (LASIX) 40 MG tablet Take 40 mg by mouth daily.    [provider]  metoprolol tartrate (LOPRESSOR) 25 MG tablet Take 25 mg by mouth 2 (two) times daily.     [provider]  omeprazole (PRILOSEC) 20 MG capsule Take 20 mg by mouth daily.    [provider]  potassium chloride (K-DUR) 10 MEQ tablet Take 1 tablet by mouth daily. 10/21/14   [provider]  simvastatin (ZOCOR) 20 MG tablet Take 20 mg by mouth at bedtime.     [provider]    Family History Family History  Problem Relation Age of Onset  . Hypertension Mother   . Hyperlipidemia Mother   . Alzheimer's disease Mother   . Hypertension Father   . Cerebral aneurysm Father     Social History Social History   Tobacco Use  . Smoking status: Former Smoker    Last attempt to quit: 12/20/2007    Years since quitting: 10.7  Substance Use Topics  . Alcohol use: No    Alcohol/week: 0.0 standard drinks  . Drug use: No     Allergies   Patient has no known allergies.   Review of Systems Review of Systems  Constitutional: Positive for chills, diaphoresis and fatigue. Negative for fever.  HENT: Negative for congestion and rhinorrhea.   Eyes: Negative for photophobia and visual disturbance.  Respiratory: Positive for cough, sputum production, chest tightness, shortness of breath and wheezing. Negative for hemoptysis and stridor.   Cardiovascular: Positive for palpitations and leg swelling. Negative for chest pain and syncope.  Gastrointestinal: Positive for constipation and nausea. Negative for abdominal pain, diarrhea and vomiting.  Genitourinary: Negative for dysuria and flank pain.  Musculoskeletal: Negative for  back pain, neck pain and neck stiffness.  Skin: Negative for rash and wound.  Neurological: Negative for light-headedness and headaches.  Psychiatric/Behavioral: Negative for agitation.  All other systems reviewed and are negative.    Physical Exam Updated Vital Signs BP 118/89 (BP Location: Right Arm)   Pulse 90   Temp 98 F (36.7 C) (Oral)   Resp (!) 24   Ht 4\' 11"  (1.499 m)   Wt 59 kg   SpO2 100%   BMI 26.26 kg/m   Physical Exam  Constitutional: She is oriented to person, place, and time. She appears well-developed and well-nourished.  Non-toxic appearance. She does not appear ill. No distress.  HENT:  Head: Normocephalic and atraumatic.  Mouth/Throat: Oropharynx is clear and moist. No oropharyngeal exudate.  Eyes: Pupils are equal, round, and reactive to light. Conjunctivae are normal.  Neck: Neck supple.  Cardiovascular: Normal rate. A  regularly irregular rhythm present.  Murmur heard. Pulmonary/Chest: Effort normal. Tachypnea noted. No respiratory distress. She has decreased breath sounds in the right lower field and the left lower field. She has no wheezes. She has no rhonchi. She has rales in the right middle field, the right lower field, the left middle field and the left lower field. She exhibits no tenderness.  Abdominal: Soft. There is no tenderness. There is no guarding.  Musculoskeletal: She exhibits edema. She exhibits no tenderness.  Neurological: She is alert and oriented to person, place, and time. No sensory deficit. She exhibits normal muscle tone.  Skin: Skin is warm and dry. Capillary refill takes less than 2 seconds. No rash noted. She is not diaphoretic. No erythema.  Psychiatric: She has a normal mood and affect.  Nursing note and vitals reviewed.    ED Treatments / Results  Labs (all labs ordered are listed, but only abnormal results are displayed) Labs Reviewed  COMPREHENSIVE METABOLIC PANEL - Abnormal; Notable for the following components:       Result Value   Glucose, Bld 109 (*)    Creatinine, Ser 1.23 (*)    GFR calc non Af Amer 40 (*)    GFR calc Af Amer 46 (*)    All other components within normal limits  BRAIN NATRIURETIC PEPTIDE - Abnormal; Notable for the following components:   B Natriuretic Peptide 693.3 (*)    All other components within normal limits  URINALYSIS, ROUTINE W REFLEX MICROSCOPIC - Abnormal; Notable for the following components:   APPearance HAZY (*)    Specific Gravity, Urine 1.033 (*)    Leukocytes, UA TRACE (*)    Bacteria, UA RARE (*)    All other components within normal limits  I-STAT CG4 LACTIC ACID, ED - Abnormal; Notable for the following components:   Lactic Acid, Venous 2.35 (*)    All other components within normal limits  I-STAT CG4 LACTIC ACID, ED - Abnormal; Notable for the following components:   Lactic Acid, Venous 2.13 (*)    All other components within normal limits  URINE CULTURE  RESPIRATORY PANEL BY PCR  CBC WITH DIFFERENTIAL/PLATELET  LIPASE, BLOOD  PROCALCITONIN  BASIC METABOLIC PANEL  I-STAT TROPONIN, ED  I-STAT TROPONIN, ED    EKG EKG Interpretation  Date/Time:  Friday September 18 2018 08:30:15 EDT Ventricular Rate:  94 PR Interval:    QRS Duration: 96 QT Interval:  393 QTC Calculation: 492 R Axis:   -51 Text Interpretation:  Sinus rhythm Atrial premature complex Left anterior fascicular block Probable anteroseptal infarct, old Nonspecific T abnormalities, lateral leads when compared to prior, slightly longer QTC.  No STEMI Confirmed by Theda Belfast ([id number]) on 10/01/2018 9:00:58 AM   Radiology Dg Chest 2 View  Result Date: 09/24/2018 CLINICAL DATA:  Shortness of Breath EXAM: CHEST - 2 VIEW COMPARISON:  May 25, 2014 FINDINGS: There is cardiomegaly with pulmonary venous hypertension. There is interstitial edema with small left pleural effusion. No consolidation. Patient is status post mitral valve replacement. There is aortic atherosclerosis. No adenopathy. No  bone lesions. IMPRESSION: Pulmonary vascular congestion with interstitial edema and small left pleural effusion. Suspect a degree of congestive heart failure. No consolidation. Status post mitral valve replacement. There is aortic atherosclerosis. Aortic Atherosclerosis (ICD10-I70.0). Electronically Signed   By: Bretta Bang III M.D.   On: 09/28/2018 09:02    Procedures Procedures (including critical care time)    Medications Ordered in ED Medications  albuterol (PROVENTIL) (2.5 MG/3ML) 0.083% nebulizer  solution 5 mg (0 mg Nebulization Hold 10/06/2018 1005)  hydrALAZINE (APRESOLINE) tablet 75 mg (has no administration in time range)  simvastatin (ZOCOR) tablet 20 mg (has no administration in time range)  clopidogrel (PLAVIX) tablet 75 mg (has no administration in time range)  enoxaparin (LOVENOX) injection 30 mg (has no administration in time range)  acetaminophen (TYLENOL) tablet 650 mg (has no administration in time range)    Or  acetaminophen (TYLENOL) suppository 650 mg (has no administration in time range)  senna-docusate (Senokot-S) tablet 1 tablet (has no administration in time range)  albuterol (PROVENTIL) (2.5 MG/3ML) 0.083% nebulizer solution 2.5 mg (has no administration in time range)  metoprolol tartrate (LOPRESSOR) 25 mg/10 mL oral suspension 25 mg (has no administration in time range)  Influenza vac split quadrivalent PF (FLUZONE HIGH-DOSE) injection 0.5 mL (has no administration in time range)  furosemide (LASIX) injection 40 mg (40 mg Intravenous Given Oct 06, 2018 1331)     Initial Impression / Assessment and Plan / ED Course  I have reviewed the triage vital signs and the nursing notes.  Pertinent labs & imaging results that were available during my care of the patient were reviewed by me and considered in my medical decision making (see chart for details).     SHACARA COZINE is a 84 y.o. female with a past medical history significant for hypertension, CHF on Lasix,  atrial fibrillation not on anticoagulation, prior stroke on Plavix, CAD status post PCI, prior mitral valve repair, and chronic anemia who presents with shortness of breath, chills, productive cough, worsening peripheral edema, fatigue, and nausea.  Patient reports that last night she began having worsening shortness of breath.  She reports that over the last few days she has not been able to lay flat and is more short of breath when she tries to lay down.  She reports her legs been more swollen.  She reports she is continue take her Lasix.  She reports this morning she was nauseous but had no vomiting.  She was diaphoretic with her shortness of breath.  She reports some chest tightness but no chest pain or chest pressure.  She reports she has had some chills but no fever at home.  She reports she is had no urinary symptoms but has chronic constipation.  No diarrhea.  She reports that this morning she had increased work of breathing and called for help.  According to EMS report to nursing, patient had oxygen saturations around 90% and was placed on nasal cannula oxygen with improvement.  Patient also was given albuterol which improved her breathing and a small amount of wheezing that she had initially.  On my exam, she has no wheezing but does have significant rales in the base of the lungs bilaterally.  Decreased breath sounds in the bases.  Murmur was appreciated.  Patient had irregular heartbeat on palpated pulse.  Abdomen nontender.  Legs had edema in both legs.  Back was nontender.    EKG showed a sinus rhythm with no STEMI.  Clinically I am concerned about CHF exacerbation versus some reactive airway disease with no wheezing on EMS arrival.  Next  Patient is now on 2 L nasal cannula with improved oxygen.  Anticipate reassessment after laboratory testing imaging and further monitoring.  Anticipate patient may need admission if she still has an oxygen requirement.  Patient's work-up revealed evidence  of fluid overload.  Patient's BNP was elevated, x-ray shows fluid on lungs, and patient is new oxygen requirement.  Next  Given patient's fluid overload, patient will be admitted for diuresis and CHF exacerbation.  Final Clinical Impressions(s) / ED Diagnoses   Final diagnoses:  SOB (shortness of breath)  Hypervolemia, unspecified hypervolemia type    ED Discharge Orders    None      Clinical Impression: 1. SOB (shortness of breath)   2. Hypervolemia, unspecified hypervolemia type     Disposition: Admit  This note was prepared with assistance of Dragon voice recognition software. Occasional wrong-word or sound-a-like substitutions may have occurred due to the inherent limitations of voice recognition software.     Tegeler, Canary Brim, MD 18-Oct-2018 [phone]

## 2018-09-18 NOTE — Progress Notes (Signed)
Patient had an episode of emesis after taking her liquid metoprolol.  Patient had complained the medicine was too sweet.  Pharmacist notified patient prefers tablet. Elnita Maxwell, RN

## 2018-09-18 NOTE — H&P (Addendum)
Date: 09-29-18               Patient Name:  [name] MRN: [id number]  DOB: Apr 22, 1934 Age / Sex: 84 y.o., female   PCP: No primary care provider on file.         Medical Service: Internal Medicine Teaching Service         Attending Physician: [name]    First Contact: [name] Pager: [phone]  Second Contact: [name] Pager: [phone]       After Hours (After 5p/  First Contact Pager: [phone]  weekends / holidays): Second Contact Pager: [phone]   Chief Complaint: SOB  History of Present Illness: [name] is an 84 yo African American female with a PMHx of HTN, HLD, left knee replacement, atrial fibrillation on plavix, mitral valve replacement, combined systolic and diastolic HF presenting with a one day history of SOB. She noticed last night that she was having trouble breathing sitting up and laying down. She also noticed pressure in her chest, neck pain, right hand numbness, nausea, LE swelling and fever. She said she had some back pain around her shoulder blade which made laying on her back difficult. She denied any abdominal pain or urinary symptoms but endorsed nausea. She has had a productive cough all summer with yellow phlegm but denied any other recent illnesses. She has had CHF exacerbations in the past that presented similarly. She also complained of left leg pain she relates to her knee surgery. Her daughter and daughter in law were at the bedside and noticed she has been less active the last few days and has a chronic poor appetite. She lives with her daughter and is able to get around on her own.   In the ED, she was mildly tachypnic at 23, normotensive, oxygen saturation of 99% on room air and afebrile. She received 5 mg of albuterol nebulizer. She was found to have a BNP of 693, lactic acid of 2.35 and CXR showed pulmonary vascular congestion with interstitial edema and small left pleural effusion, no consolidation.  Meds:    Current Meds  Medication Sig  . acetaminophen (TYLENOL) 650 MG CR tablet Take 1,300 mg by mouth 2 (two) times daily.  . Artificial Tear Ointment (DRY EYES OP) Place 2 drops into both eyes as needed.  Marland Kitchen aspirin EC 81 MG tablet Take 81 mg by mouth daily.  . clopidogrel (PLAVIX) 75 MG tablet Take 1 tablet (75 mg total) by mouth daily.  Marland Kitchen diltiazem (DILACOR XR) 240 MG 24 hr capsule Take 240 mg by mouth daily.  . ferrous gluconate (FERGON) 324 MG tablet Take 324 mg by mouth daily with breakfast.  . hydrALAZINE (APRESOLINE) 25 MG tablet Take 75 mg by mouth at bedtime.  . metoprolol tartrate (LOPRESSOR) 25 MG tablet Take 25 mg by mouth 2 (two) times daily.   Marland Kitchen omeprazole (PRILOSEC) 20 MG capsule Take 20 mg by mouth daily.  . simvastatin (ZOCOR) 20 MG tablet Take 20 mg by mouth at bedtime.      Allergies: Allergies as of 29-Sep-2018  . (No Known Allergies)   Past Medical History:  Diagnosis Date  . Acute ischemic stroke (HCC) 10/31/2014  . Anemia   . Arthritis   . Atrial fibrillation (HCC) 2010  . Cerebral infarction due to unspecified mechanism 10/31/2014   Right frontal small thrombotic stroke   . Chronic combined systolic and diastolic CHF, NYHA class 2 (HCC)   .  CVA (cerebral infarction) 10/31/2014  . Family history of anesthesia complication    Daughter; takes a long time to wake up  . Frequency of urination    at time  . GERD (gastroesophageal reflux disease)   . Hyperlipemia   . Hypertension   . Mitral stenosis   . Postoperative anemia due to acute blood loss 10/27/2014  . Pulmonary hypertension (HCC)   . S/P minimally invasive mitral valve repair 07/05/2009   Complex valvuloplasty including quadrangular resection of posterior leaflet with CorMatrix patch augmentation of posterior leaflet and 30 mm Sorin Memo 3-D ring annuloplasty via right mini thoracotomy approach   . Shortness of breath    while walking  . Upper GI bleed 2010   Reportedly normal EGD and colonoscopy     Family History:  Family History  Problem Relation Age of Onset  . Hypertension Mother   . Hyperlipidemia Mother   . Alzheimer's disease Mother   . Hypertension Father   . Cerebral aneurysm Father     Social History:  Social History   Socioeconomic History  . Marital status: Widowed    Spouse name: Not on file  . Number of children: Not on file  . Years of education: Not on file  . Highest education level: Not on file  Occupational History  . Not on file  Social Needs  . Financial resource strain: Not on file  . Food insecurity:    Worry: Not on file    Inability: Not on file  . Transportation needs:    Medical: Not on file    Non-medical: Not on file  Tobacco Use  . Smoking status: Former Smoker    Last attempt to quit: 12/20/2007    Years since quitting: 10.7  Substance and Sexual Activity  . Alcohol use: No    Alcohol/week: 0.0 standard drinks  . Drug use: No  . Sexual activity: Not on file  Lifestyle  . Physical activity:    Days per week: Not on file    Minutes per session: Not on file  . Stress: Not on file  Relationships  . Social connections:    Talks on phone: Not on file    Gets together: Not on file    Attends religious service: Not on file    Active member of club or organization: Not on file    Attends meetings of clubs or organizations: Not on file    Relationship status: Not on file  . Intimate partner violence:    Fear of current or ex partner: Not on file    Emotionally abused: Not on file    Physically abused: Not on file    Forced sexual activity: Not on file  Other Topics Concern  . Not on file  Social History Narrative  . Not on file    Review of Systems: A complete ROS was negative except as per HPI.   Physical Exam: Blood pressure 107/88, pulse 78, temperature 98 F (36.7 C), temperature source Oral, resp. rate (!) 27, height 4\' 11"  (1.499 m), weight 59 kg, SpO2 93 %.   Physical exam: General- seen sitting up in bed, in  NAD Heart- irregularly irregular Lungs- no breath sounds appreciated, normal effort Abdomen- non distended, non tender Extremities- no edema  EKG: personally reviewed my interpretation is sinus rhythm, PACs appreciated in the lateral leads  CXR: personally reviewed my interpretation is mild pulmonary vascular congestion  Assessment & Plan by Problem: Active Problems:   Acute on  chronic systolic (congestive) heart failure (HCC)  Ms. Langseth is an 84 yo [place] American female with a PMHx of HTN, HLD, CVA, atrial fibrillation on plavix, mitral valve replacement, combined systolic and diastolic HF presenting with a one day history of SOB, orthopnea, subjective fever, neck and back pain.   Acute on chronic systolic heart failure - lung exam unremarkable, no breath sounds appreciated; no lower extremity edema appreciated - CXR illustrated pulmonary vascular congestion with interstitial edema and small left pleural effusion. Suspect a degree of congestive heart failure. No consolidation.  - last echo from 2015 showed LV EF 35-40%, moderately reduced systolic function - saturating well on room air  - given one dose of lasix 40 mg IV  - albuterol 2.5 mg q6h prn  - strict ins and outs  - f/u bladder scan - f/u echo - repeat ekg in the am - telemetry   HTN - continue hydralazine 75 mg qd, metoprolol 25 mg bid  MVR s/p repair - continue home dose of clopidogrel 75 mg qd, metoprolol 25 mg bid   HLD - continue simvastatin 20 mg qd  Prior CVA 11/15 - MRI/MRA showed mall subtle cortical ischemic infarct involving the posterior right frontal lobe - patient had left hand weakness and numbness at that time  Diet: Regular DVT Prophylaxis: Lovenox Full code  Dispo: Admit patient to Inpatient with expected length of stay greater than 2 midnights.  SignedJaci Standard, DO 10/02/2018, 3:21 PM  Pager: [phone]

## 2018-09-18 NOTE — ED Notes (Signed)
Echo at bedside

## 2018-09-18 NOTE — ED Notes (Signed)
Dr. Tegeler notified of elevated CG-4 

## 2018-09-18 NOTE — ED Notes (Signed)
Patient transported to X-ray 

## 2018-09-18 NOTE — ED Triage Notes (Signed)
Per EMS pt from home and complains of SOB around 2300 last night.  It slowly got worse and she called 911.  Pt currently denies SOB she did receive 5mg  of Albuterol Neb.  Denies pain.  Has a hx of AFIB and is on Clopidogrel, cardiac stent, and hypertension.  AOx4 NAD noted at this time. 99% RA currently

## 2018-09-19 ENCOUNTER — Inpatient Hospital Stay (HOSPITAL_COMMUNITY): Payer: Medicare Other

## 2018-09-19 DIAGNOSIS — J810 Acute pulmonary edema: Secondary | ICD-10-CM

## 2018-09-19 DIAGNOSIS — I342 Nonrheumatic mitral (valve) stenosis: Secondary | ICD-10-CM

## 2018-09-19 DIAGNOSIS — I2729 Other secondary pulmonary hypertension: Secondary | ICD-10-CM

## 2018-09-19 DIAGNOSIS — I5040 Unspecified combined systolic (congestive) and diastolic (congestive) heart failure: Secondary | ICD-10-CM

## 2018-09-19 DIAGNOSIS — J811 Chronic pulmonary edema: Secondary | ICD-10-CM

## 2018-09-19 DIAGNOSIS — R0602 Shortness of breath: Secondary | ICD-10-CM

## 2018-09-19 DIAGNOSIS — N179 Acute kidney failure, unspecified: Secondary | ICD-10-CM

## 2018-09-19 DIAGNOSIS — E872 Acidosis: Secondary | ICD-10-CM

## 2018-09-19 DIAGNOSIS — I050 Rheumatic mitral stenosis: Principal | ICD-10-CM

## 2018-09-19 LAB — RESPIRATORY PANEL BY PCR
Adenovirus: NOT DETECTED
Bordetella pertussis: NOT DETECTED
CHLAMYDOPHILA PNEUMONIAE-RVPPCR: NOT DETECTED
CORONAVIRUS HKU1-RVPPCR: NOT DETECTED
CORONAVIRUS NL63-RVPPCR: NOT DETECTED
Coronavirus 229E: NOT DETECTED
Coronavirus OC43: NOT DETECTED
INFLUENZA A-RVPPCR: NOT DETECTED
Influenza B: NOT DETECTED
MYCOPLASMA PNEUMONIAE-RVPPCR: NOT DETECTED
Metapneumovirus: NOT DETECTED
PARAINFLUENZA VIRUS 1-RVPPCR: NOT DETECTED
PARAINFLUENZA VIRUS 4-RVPPCR: NOT DETECTED
Parainfluenza Virus 2: NOT DETECTED
Parainfluenza Virus 3: NOT DETECTED
RESPIRATORY SYNCYTIAL VIRUS-RVPPCR: NOT DETECTED
Rhinovirus / Enterovirus: NOT DETECTED

## 2018-09-19 LAB — LACTIC ACID, PLASMA
LACTIC ACID, VENOUS: 3.5 mmol/L — AB (ref 0.5–1.9)
Lactic Acid, Venous: 3.8 mmol/L (ref 0.5–1.9)
Lactic Acid, Venous: 4 mmol/L (ref 0.5–1.9)

## 2018-09-19 LAB — BASIC METABOLIC PANEL
ANION GAP: 15 (ref 5–15)
BUN: 24 mg/dL — ABNORMAL HIGH (ref 8–23)
CALCIUM: 9.2 mg/dL (ref 8.9–10.3)
CO2: 18 mmol/L — AB (ref 22–32)
Chloride: 105 mmol/L (ref 98–111)
Creatinine, Ser: 1.29 mg/dL — ABNORMAL HIGH (ref 0.44–1.00)
GFR calc non Af Amer: 38 mL/min — ABNORMAL LOW (ref >60)
GFR, EST AFRICAN AMERICAN: 44 mL/min — AB (ref >60)
Glucose, Bld: 139 mg/dL — ABNORMAL HIGH (ref 70–99)
Potassium: 3.1 mmol/L — ABNORMAL LOW (ref 3.5–5.1)
SODIUM: 138 mmol/L (ref 135–145)

## 2018-09-19 LAB — GLUCOSE, CAPILLARY: Glucose-Capillary: 140 mg/dL — ABNORMAL HIGH (ref 70–99)

## 2018-09-19 LAB — PROCALCITONIN: Procalcitonin: <0.1 ng/mL

## 2018-09-19 MED ORDER — FUROSEMIDE 10 MG/ML IJ SOLN: 20.0000 mg | Freq: Two times a day (BID) | INTRAMUSCULAR | Status: DC

## 2018-09-19 MED ORDER — PROMETHAZINE HCL 25 MG/ML IJ SOLN: 12.5000 mg | Freq: Four times a day (QID) | INTRAMUSCULAR | Status: DC | PRN

## 2018-09-19 MED ORDER — ALUM & MAG HYDROXIDE-SIMETH 200-200-20 MG/5ML PO SUSP
30.0000 mL | Freq: Four times a day (QID) | ORAL | Status: DC | PRN
  Administered 2018-09-19: 30 mL via ORAL
  Filled 2018-09-19: qty 30

## 2018-09-19 MED ORDER — FUROSEMIDE 10 MG/ML IJ SOLN
20.0000 mg | Freq: Every day | INTRAMUSCULAR | Status: DC
  Administered 2018-09-19: 20 mg via INTRAVENOUS

## 2018-09-19 MED ORDER — GUAIFENESIN-DM 100-10 MG/5ML PO SYRP
5.0000 mL | ORAL_SOLUTION | ORAL | Status: DC | PRN
  Administered 2018-09-19: 5 mL via ORAL
  Filled 2018-09-19: qty 5

## 2018-09-19 MED ORDER — POTASSIUM CHLORIDE CRYS ER 20 MEQ PO TBCR
40.0000 meq | EXTENDED_RELEASE_TABLET | Freq: Two times a day (BID) | ORAL | Status: AC
  Administered 2018-09-19 (×2): 40 meq via ORAL
  Filled 2018-09-19 (×2): qty 2

## 2018-09-19 MED ORDER — SODIUM CHLORIDE 0.9 % IV BOLUS
250.0000 mL | Freq: Once | INTRAVENOUS | Status: AC
  Administered 2018-09-19: 250 mL via INTRAVENOUS

## 2018-09-19 MED ORDER — FUROSEMIDE 10 MG/ML IJ SOLN
INTRAMUSCULAR | Status: AC
  Administered 2018-09-19: 20 mg via INTRAVENOUS
  Filled 2018-09-19: qty 2

## 2018-09-19 MED ORDER — FUROSEMIDE 10 MG/ML IJ SOLN
INTRAMUSCULAR | Status: AC
  Administered 2018-09-19: 20 mg
  Filled 2018-09-19: qty 4

## 2018-09-20 LAB — URINE CULTURE

## 2018-09-20 NOTE — Progress Notes (Signed)
   10/19/2018 0100  Clinical Encounter Type  Visited With Patient and family together  Visit Type Initial  Referral From Nurse  Stress Factors  Patient Stress Factors Other (Comment)  Family Stress Factors Exhausted;Major life changes;Loss of control;Loss;Health changes   Responded to page. Upon arriving on floor, Nurse informed me that family was at beside and in need of spiritual care after PT's transition. I offered ministry of presence and words of encouragement. Chaplain available as needed.   Chaplain Orest Dikes  [phone]

## 2018-10-16 NOTE — Progress Notes (Addendum)
   Subjective: patient was evaluated this morning on rounds. She was sitting up eating her breakfast and appeared comfortable. She states she continues to have shortness of breath.  Objective:  Vital signs in last 24 hours: Vitals:   10/07/2018 1953 Sep 30, 2018 0422 2018/09/30 0437 2018/09/30 0921  BP: (!) 130/101  (!) 132/102 (!) 127/99  Pulse: 89  90   Resp: 18  18   Temp: 98.1 F (36.7 C)  97.6 F (36.4 C)   TempSrc: Oral  Oral   SpO2: 99%  100%   Weight:  62.4 kg    Height:       Physical Exam  Constitutional: She is well-developed, well-nourished, and in no distress.  Cardiovascular: Normal rate.  Murmur heard. Irregular rhythm   Pulmonary/Chest: Effort normal and breath sounds normal. No respiratory distress. She has no wheezes. She has no rales.  Abdominal: Soft. She exhibits no distension. There is no tenderness.  Musculoskeletal: She exhibits no edema.  Skin: Skin is warm and dry.     Assessment/Plan:  Active Problems:   Severe mitral valve stenosis   Acute bronchitis  Ms. Held is an 84 yo [place] American female with a PMHx of HTN, HLD, CVA,, mitral valve repair, hx of combined systolic and diastolic HF presenting with a one day history of SOB, orthopnea.  Severe mitral valve stenosis Noted on echocardiogram this admission. Discussed with cardiology for medication recommendations. Overnight increased her dose of metoprolol from 25 mg BID to 50 mg BID. Will continue this.  This morning patient continues to have symptoms of shortness of breath. She is satting 100% on room air. Will obtain formal cardiology consult to continue to help with medical management. Will discuss case with CVTS to see if she is a candidate for mitral valve replacement. She had a MVRepair in 2010 (although can not see procedure note).  There was discussion of MVR replacement in the past with Dr. Cornelius Moras however medical management at that time was decided. -metoprolol 50 mg BID -Hydralazine 75 mg  daily -clopidogrel 75mg  qd -Appreciate cardiology recommendations - appreciate CVTS recommendations  Lactic acidosis Patient presented with an increase in lactic acidosis. There was a trend downward however this morning it was elevated.  Unclear etiology at this time.  Will get a repeat. Patient is not diabetic and no signs of DKA, pro-calcitonin was low, she is not hypotensive.  Will trend. -Trend LA  AKI creatinine 1.2 baseline appears to be 0.75 4 years ago. Likely from diuresis. Will get back fluids gently -250cc bolus over one hour  HTN - continue hydralazine 75 mg qd, metoprolol 50 mg bid HLD - continue simvastatin 20 mg qd Prior CVA 11/15 No residual deficits.   -clopidogrel 75 mg qd  Dispo: Anticipated discharge pending clinical improvement  Camelia Phenes, DO September 30, 2018, 10:01 AM Pager: [phone]

## 2018-10-16 NOTE — Progress Notes (Signed)
Went to evaluate the patient at bedside this afternoon.  Patient's daughter and grandson were present. I discussed the current tenuous state of the patient.  I explained that the patient is in heart failure and we are treating with medications specifically Lasix and metoprolol and that we have discussed this plan with the help of cardiology and cardiothoracic surgery. I told them that I was very concerned about her current state as her lactic acid was increasing and her symptoms were not improving.  They expressed understanding.  Dr. Cyndie Chime and I went to re-evaluate patient at bedside after my discussion with the patient's daughter and grandson. At that time the daughter and granddaughter( via cell phone) were present.  We communicated again the clinical course and at this time discussed future goals.  The patient stated she did not want to be placed on a ventilator if her symptoms worsened.  Later in the discussion the son and his wife were present.  Family members were in agreement to follow their mother's wishes and not pursue heroic meaures.  Patient has been made a DO NOT RESUSCITATE.  We will continue with current medical management and continue to communicate with cardiology daily.

## 2018-10-16 NOTE — Consult Note (Signed)
Cardiology Consultation:   Patient ID: [name] MRN: [id number]; DOB: 20-Mar-1934  Admit date: 09-22-2018 Date of Consult: 10/02/2018  Primary Care Provider: Dr. Loleta Chance Primary Cardiologist: Dr. Jacinto Halim Primary Electrophysiologist:  None    Patient Profile:   [name] is a 84 y.o. female with a hx of CVA, prior PFO closure and mitral valve repair who is being seen today for the evaluation of severe mitral stenosis at the request of Dr. Cyndie Chime. It was not discovered that she has followed with Dr. Jacinto Halim until after the consult, so this is being offered as a one time consult. Primary team instructed to call Dr. Verl Dicker office for further management.  History of Present Illness:   [name] is an 84 year old woman with history of stroke, prior PFO and mitral valve repair, paroxysmal atrial fibrillation who presented with acute shortness of breath, PND, orthopnea, and chest tightness. She endorses recent productive cough and possible subjective fevers.   [name] can barely speak as she is extremely dyspneic. History is provided by family. They note that overall she had been doing very well until just recently. No recent atrial fibrillation that they are aware of. She was on aspiring and clopidogrel (unsure why) but not coumadin as an outpatient. She has chronic lower extremity edema. They are unsure about weight changes. Just recently she has had PND and orthopnea. No syncope. No palpitations.  Past Medical History:  Diagnosis Date  . Acute ischemic stroke (HCC) 10/31/2014  . Anemia   . Arthritis   . Atrial fibrillation (HCC) 2010  . Cerebral infarction due to unspecified mechanism 10/31/2014   Right frontal small thrombotic stroke   . Chronic combined systolic and diastolic CHF, NYHA class 2 (HCC)   . CVA (cerebral infarction) 10/31/2014  . Family history of anesthesia complication    Daughter; takes a long time to wake up  . Frequency of urination    at time  . GERD  (gastroesophageal reflux disease)   . Hyperlipemia   . Hypertension   . Mitral stenosis   . Postoperative anemia due to acute blood loss 10/27/2014  . Pulmonary hypertension (HCC)   . S/P minimally invasive mitral valve repair 07/05/2009   Complex valvuloplasty including quadrangular resection of posterior leaflet with CorMatrix patch augmentation of posterior leaflet and 30 mm Sorin Memo 3-D ring annuloplasty via right mini thoracotomy approach   . Shortness of breath    while walking  . Upper GI bleed 2010   Reportedly normal EGD and colonoscopy    Past Surgical History:  Procedure Laterality Date  . ABDOMINAL HYSTERECTOMY    . CHOLECYSTECTOMY    . EYE SURGERY Bilateral    cataract removed; lens implant  . MITRAL VALVE REPAIR  07/05/2009   quadrangular resection of posterior leaflet with CorMatrix patch  . PATENT FORAMEN OVALE CLOSURE     07/05/2009  . TEE WITHOUT CARDIOVERSION N/A 11/02/2014   Procedure: TRANSESOPHAGEAL ECHOCARDIOGRAM (TEE);  Surgeon: Vesta Mixer, MD;  Location: [facility] ENDOSCOPY;  Service: Cardiovascular;  Laterality: N/A;  . TOTAL KNEE ARTHROPLASTY Right 06/03/2014   Procedure: RIGHT TOTAL KNEE ARTHROPLASTY ;  Surgeon: Harvie Junior, MD;  Location: [facility] OR;  Service: Orthopedics;  Laterality: Right;  . TOTAL KNEE ARTHROPLASTY Left 10/26/2014   Procedure: LEFT TOTAL KNEE ARTHROPLASTY;  Surgeon: Harvie Junior, MD;  Location: [facility] OR;  Service: Orthopedics;  Laterality: Left;     Home Medications:  Prior to Admission medications   Medication Sig  Start Date End Date Taking? Authorizing Provider  acetaminophen (TYLENOL) 650 MG CR tablet Take 1,300 mg by mouth 2 (two) times daily.   Yes [provider]  Artificial Tear Ointment (DRY EYES OP) Place 2 drops into both eyes as needed.   Yes [provider]  aspirin EC 81 MG tablet Take 81 mg by mouth daily.   Yes [provider]  clopidogrel (PLAVIX) 75 MG tablet Take 1 tablet (75 mg total) by mouth  daily. 11/02/14  Yes Burns, Tinnie Gens, MD  diltiazem (DILACOR XR) 240 MG 24 hr capsule Take 240 mg by mouth daily.   Yes [provider]  ferrous gluconate (FERGON) 324 MG tablet Take 324 mg by mouth daily with breakfast.   Yes [provider]  hydrALAZINE (APRESOLINE) 25 MG tablet Take 75 mg by mouth at bedtime. 08/19/18  Yes [provider]  metoprolol tartrate (LOPRESSOR) 25 MG tablet Take 25 mg by mouth 2 (two) times daily.    Yes [provider]  omeprazole (PRILOSEC) 20 MG capsule Take 20 mg by mouth daily.   Yes [provider]  simvastatin (ZOCOR) 20 MG tablet Take 20 mg by mouth at bedtime.    Yes [provider]    Inpatient Medications: Scheduled Meds: . albuterol  5 mg Nebulization Once  . clopidogrel  75 mg Oral Daily  . diltiazem  240 mg Oral Daily  . enoxaparin (LOVENOX) injection  30 mg Subcutaneous Q24H  . hydrALAZINE  75 mg Oral QHS  . mouth rinse  15 mL Mouth Rinse BID  . metoprolol tartrate  50 mg Oral BID  . potassium chloride  40 mEq Oral BID  . simvastatin  20 mg Oral QHS   Continuous Infusions: . sodium chloride 250 mL (09/26/2018 1132)   PRN Meds: acetaminophen **OR** acetaminophen, albuterol, alum & mag hydroxide-simeth, guaiFENesin-dextromethorphan, Influenza vac split quadrivalent PF, senna-docusate  Allergies:   No Known Allergies  Social History:   Social History   Socioeconomic History  . Marital status: Widowed    Spouse name: Not on file  . Number of children: Not on file  . Years of education: Not on file  . Highest education level: Not on file  Occupational History  . Not on file  Social Needs  . Financial resource strain: Not on file  . Food insecurity:    Worry: Not on file    Inability: Not on file  . Transportation needs:    Medical: Not on file    Non-medical: Not on file  Tobacco Use  . Smoking status: Former Smoker    Last attempt to quit: 12/20/2007    Years since quitting: 10.7   . Smokeless tobacco: Never Used  Substance and Sexual Activity  . Alcohol use: No    Alcohol/week: 0.0 standard drinks  . Drug use: No  . Sexual activity: Not on file  Lifestyle  . Physical activity:    Days per week: Not on file    Minutes per session: Not on file  . Stress: Not on file  Relationships  . Social connections:    Talks on phone: Not on file    Gets together: Not on file    Attends religious service: Not on file    Active member of club or organization: Not on file    Attends meetings of clubs or organizations: Not on file    Relationship status: Not on file  . Intimate partner violence:    Fear  of current or ex partner: Not on file    Emotionally abused: Not on file    Physically abused: Not on file    Forced sexual activity: Not on file  Other Topics Concern  . Not on file  Social History Narrative  . Not on file    Family History:    Family History  Problem Relation Age of Onset  . Hypertension Mother   . Hyperlipidemia Mother   . Alzheimer's disease Mother   . Hypertension Father   . Cerebral aneurysm Father      ROS:  Please see the history of present illness.  Review of Systems  Constitutional: Positive for malaise/fatigue.       Possible subjective fever  HENT: Negative for congestion and ear pain.   Eyes: Negative for double vision and pain.  Respiratory: Positive for cough, sputum production and shortness of breath.   Cardiovascular: Positive for chest pain, orthopnea, leg swelling and PND. Negative for palpitations and claudication.  Gastrointestinal: Negative for abdominal pain and nausea.  Genitourinary: Negative for dysuria and frequency.  Musculoskeletal: Negative for falls and myalgias.  Skin: Negative for rash.  Neurological: Negative for sensory change and focal weakness.  Endo/Heme/Allergies: Bruises/bleeds easily.   All other ROS reviewed and negative.     Physical Exam/Data:   Vitals:   09/25/2018 1953 September 28, 2018 0422  September 28, 2018 0437 09-28-18 0921  BP: (!) 130/101  (!) 132/102 (!) 127/99  Pulse: 89  90   Resp: 18  18   Temp: 98.1 F (36.7 C)  97.6 F (36.4 C)   TempSrc: Oral  Oral   SpO2: 99%  100%   Weight:  62.4 kg    Height:        Intake/Output Summary (Last 24 hours) at 2018-09-28 1203 Last data filed at Sep 28, 2018 0600 Gross per 24 hour  Intake 240 ml  Output 350 ml  Net -110 ml   Filed Weights   09/22/2018 0833 10/05/2018 1545 09/28/18 0422  Weight: 59 kg 61.2 kg 62.4 kg   Body mass index is 27.77 kg/m.  General:  Sitting in chair, tachypneic at rest and with brief speech. HEENT: normal Lymph: no adenopathy Neck: JVD above clavicle at 90 degrees Endocrine:  No thryomegaly Vascular: No carotid bruits; radial pulses 2+ bilaterally Cardiac:  Irregularly irregular S1, S2; Loud P2. SEM murmur heard at sternal border most consistent with mild AS. Did not appreciate diastolic murmur. Lungs:  clear to auscultation bilaterally, no wheezing, rhonchi or rales  Abd: soft, nontender, no hepatomegaly  Ext: cool but palpable pulses. Bilateral 1+ pitting edema Musculoskeletal:  No deformities, BUE and BLE strength normal and equal Skin: warm and dry  Neuro:  CNs 2-12 intact, no focal abnormalities noted Psych:  Normal affect   EKG:  The EKG was personally reviewed and demonstrates:  SR with PAC Telemetry:  Telemetry was personally reviewed and demonstrates:  Sinus arrhythmia, sinus tachycardia, occasional PVCs  Relevant CV Studies: Study Conclusions  - Left ventricle: The cavity size was normal. Wall thickness was   increased in a pattern of moderate LVH. Systolic function was   vigorous. The estimated ejection fraction was in the range of 65%   to 70%. Wall motion was normal; there were no regional wall   motion abnormalities. The study is not technically sufficient to   allow evaluation of LV diastolic function. - Mitral valve: Reported prior mitral valve repair. Severe   stenosis. There  was trivial regurgitation. Deceleration time: 52  ms. Mean gradient (D): 15 mm Hg. Valve area by continuity   equation (using LVOT flow): 0.68 cm^2. - Left atrium: Severely dilated. - Right atrium: The atrium was mildly dilated. - Tricuspid valve: There was mild regurgitation. - Pulmonary arteries: PA peak pressure: 72 mm Hg (S). - Inferior vena cava: The vessel was dilated. The respirophasic   diameter changes were blunted (< 50%), consistent with elevated   central venous pressure.  Impressions: - Compared to a prior study in 2015, the LVEF has improved to   65-70%. There is severe stenosis of the previously repaired   mitral valve with severe LAE and severe pulmonary hypertension.   TEE and surgical evaluation are recommended.  Laboratory Data:  Chemistry Recent Labs  Lab 10/03/2018 0930 10/05/2018 0623  NA 138 138  K 3.8 3.1*  CL 104 105  CO2 23 18*  GLUCOSE 109* 139*  BUN 16 24*  CREATININE 1.23* 1.29*  CALCIUM 9.3 9.2  GFRNONAA 40* 38*  GFRAA 46* 44*  ANIONGAP 11 15    Recent Labs  Lab 09/30/2018 0930  PROT 6.7  ALBUMIN 3.9  AST 22  ALT 8  ALKPHOS 63  BILITOT 0.8   Hematology Recent Labs  Lab 10/03/2018 0930  WBC 8.0  RBC 4.89  HGB 13.9  HCT 44.3  MCV 90.6  MCH 28.4  MCHC 31.4  RDW 13.6  PLT 218   Cardiac EnzymesNo results for input(s): TROPONINI in the last 168 hours.  Recent Labs  Lab 09/21/2018 1000 09/17/2018 1251  TROPIPOC 0.05 0.07    BNP Recent Labs  Lab 09/17/2018 0930  BNP 693.3*    DDimer No results for input(s): DDIMER in the last 168 hours.  Radiology/Studies:  Dg Chest 2 View  Result Date: 09/15/2018 CLINICAL DATA:  Shortness of Breath EXAM: CHEST - 2 VIEW COMPARISON:  May 25, 2014 FINDINGS: There is cardiomegaly with pulmonary venous hypertension. There is interstitial edema with small left pleural effusion. No consolidation. Patient is status post mitral valve replacement. There is aortic atherosclerosis. No adenopathy. No bone  lesions. IMPRESSION: Pulmonary vascular congestion with interstitial edema and small left pleural effusion. Suspect a degree of congestive heart failure. No consolidation. Status post mitral valve replacement. There is aortic atherosclerosis. Aortic Atherosclerosis (ICD10-I70.0). Electronically Signed   By: Bretta Bang III M.D.   On: 09/29/2018 09:02    Assessment and Plan:   1. Severe mitral stenosis: very symptomatic. I personally reviewed her echo. Her valve is very calcified, and there is only a small area open to receive blood flow. TEE recommended for better visualization, but she is high risk for this and should be done with anesthesia given her high pulmonary pressures and her respiratory status. She will likely require R/L heart cath as well, but I am not sure that she could tolerate lying flat at this time. Ideally would like to afterload reduce her as much as possible. She does appear to have some excess volume on. I would be concerned that she may have cardiorenal syndrome given her cool feet and elevated lactate and creatinine. With a prior mitral valve repair, she likely wouldn't be a candidate for a valvotomy, and it appears her [name] score would not support that anyway. I think she will need surgery sooner rather than later, but her clinical status is tenuous. -she will not tolerate tachycardia at all. While bradycardia is not ideal, given that her cardiac output is likely driven by her heart rate (because of low stroke volume),  tachycardia will not given her time to fill her ventricle. She is currently on diltiazem. If her rates become difficult to control, could consider holding oral diltiazem and titrating HR with a dilt drip. -continue afterload reduction as much as her pressures tolerate. Agree with hydralazine -will need to be seen by CT surgery as soon as possible. Surgical replacement likely the only option she has at this time. -contact Dr. Jacinto Halim for further management  planning.  -I would keep her NPO at midnight for possible TEE on Monday. Coordinate with Dr. Jacinto Halim if he would like to perform it, or please page our group if you would like [place] to do it. Recommend using anesthesia services given her tenuous state.  2. Report of prior atrial fibrillation: with severe MS, severely dilated left atrium and very high PA pressures, she is at high risk for atrial fibrillation and clot. She endorses some unclear history of atrial fibrillation. There are notes in the chart that state she is on clopidogrel for atrial fibrillation, but this would not be the indication for clopidogrel. She does not think she has ever been on anticoagulation. I would have an extremely low threshold for starting heparin if she has any sustained atrial arrhythmia. I am contemplating starting prophylactic heparin therapy now given her risk and the fact that her blood counts have recovered from prior anemia, but I will defer to Dr. Jacinto Halim as he knows her history better.  3. Medication management: -on chart review, appears that clopidogrel was started on 11/02/2014 for stroke. Denies any stents or recent vascular procedures. Would hold this in anticipation of possible intervention  [facility] will sign off given anticipation of care by Dr. Jacinto Halim. Please do not hesitate to contact us for additional assisstance.    For questions or updates, please contact [facility] Please consult [email or web address] for contact info under   Signed, Jodelle Red, MD  10/09/2018 12:03 PM

## 2018-10-16 NOTE — Progress Notes (Signed)
We were called about Ms. [name] found to be decreased responsiveness with decreased blood pressure. When we arrived in the room [name] had agonal breathing and a reduced pulse with gradually decreasing HR. We called to notify her family and I was able to reach her daughter [name] who is on her way to the hospital now.

## 2018-10-16 NOTE — Progress Notes (Signed)
Medicine attending: I examined this patient today and reviewed all pertinent clinical laboratory and EKG data and I attest to the accuracy of the evaluation and management plan as recorded by resident physician Dr. Geralyn Corwin. We greatly appreciate cardiology input and recommendations on medications.  Cardiovascular surgery notified.  They will evaluate the patient on Monday to see if she is a surgical candidate.  Per cardiology, optimally she would require a right heart cath and a transesophageal echocardiogram but she may not be able to tolerate being in the supine position to have these tests done. After an initial fall, lactic acid levels are now starting to rise suggesting tissue hypoperfusion from stenotic mitral valve.  Patient remains dyspneic at rest.  Oxygenation borderline on 2 L nasal cannula at 94%.  Blood pressure holding at 123/93.  Beta-blocker dose increased per cardiology.  Heart rate down to 65.  We are continuing gentle furosemide diuresis. Prognosis is guarded at this time.

## 2018-10-16 NOTE — Progress Notes (Signed)
Difficult to get urine output as pt puts toilet paper in bsc, instructed to use trash can instead of toilet so we can measure urine

## 2018-10-16 NOTE — Progress Notes (Addendum)
Patient has a bed available at 4E room 25.  RN attempted to give report, wast told receiving nurse not available to take report at this time.  RN left call back number.  Elnita Maxwell, RN   Patient report given to 4E RN.  Patient transferred to 4E rm 25.  Elnita Maxwell, RN

## 2018-10-16 NOTE — Progress Notes (Signed)
Patient admitted to unit from [facility] after receiving report. She is visibly dyspneic and has orthopnea, needing multiple pillows or bed at 45 degrees for comfortable breathing. She is oriented to room, family at bedside and will monitor.

## 2018-10-16 NOTE — Progress Notes (Addendum)
[name] has been complaining of dyspnea throughout shift. Around Apr 23, 2329, her pulse started dropping to 30s. Pt was not responding to me. Paged MD and rapid response. Around 04/24/47, pt stopped breathing. Pt experienced pulseless electrical activity. 2 RNs assessed [name] & [name]) pt, no heart sounds and breath sounds heard over 2 mins by each nurse. Pt time of death 2351/04/24. Called granddaughter and daughter multiple times. MD called granddaughter and spoke to her. Family on their way.

## 2018-10-16 NOTE — Death Summary Note (Signed)
Name: [name] MRN: [id number] DOB: [birth year] 84 y.o.  Date of Admission: 10/10/2018  8:19 AM Date of Discharge: 10/08/18 Attending Physician: Dr. Cephas Darby  Discharge Diagnosis: Active Problems:   Severe mitral valve stenosis   Acute bronchitis   Acute pulmonary edema (HCC)   Cause of death: Acute Systolic Heart Failure worsened 2/2 Mitral Valve Stenosis Time of death: 05-12-51  Disposition and follow-up:   [name] was discharged from [facility] in expired condition.    Hospital Course: [name] presented with a one day history of SOB and orthopnea. She also noticed pressure in her chest, LE swelling and subjective fever. She said she had some back pain around her shoulder blade which made laying on her back difficult. She has had a productive cough all summer with yellow phlegm but denied any other recent illnesses. She has had CHF exacerbations in the past that presented similarly. Her daughter and daughter in law were at the bedside and noticed she had been less active the last few days and has a chronic poor appetite.   In the ED, she was mildly tachypnic at 23, normotensive, oxygen saturation of 99% on room air and afebrile. She received 5 mg of albuterol nebulizer. She was found to have a BNP of 693, lactic acid of 2.35 and CXR showed pulmonary vascular congestion with interstitial edema and small left pleural effusion, no consolidation. She had no signs of infectious etiology.  An echocardiogram was done to better assess the status of her HF and showed a previously repaired mitral valve to be to be tightly stenosed and severe pulmonary HTN. LV EF was hyperdynamic with estimated EF 65-70% with no regional wall abnormalities. On Friday evening 10/4, Dr. Mikey Bussing spoke with cardiologist Dr. Eden Emms who recommended IV lasix and to increase beta blocker therapy. Cardiology was formally consulted on 10/5 to consider preload reduction and MVR. They  recommended TEE with anesthesia given patient's high pulmonary pressures and also recommended a CT surgery evaluation. She also recommended contacting Dr. Jacinto Halim as this was his patient. Dr. Jacinto Halim was out of the office so Dr. Mikey Bussing spoke with Dr. Stevphen Meuse who recommended to continue the current course with IV lasix and beta blocker.   Dr. Silvano Bilis, CVTS, saw the patient on 10/5 and noted there was no indication for emergent surgical treatment. Their goal was to treat her congestive heart failure with diuretics in hopes to get her in a condition to tolerate a TEE and a right/left heart cath. She could then be considered for possible open surgical mitral valve replacement or transcatheter valve in ring replacement. This was discussed with the patient's daughter and grandson.  Dr. Mikey Bussing went to evaluate the patient later that same day, 10/5, and discussed the tenuous state of the patient with the patient's daughter and grandson. She explained the patient is in heart failure and that we are treating her with medications, specifically lasix and metoprolol, and that this plan was discussed with cardiology and cardiothoracic surgery. Dr. Mikey Bussing told the family she was very concerned about the patient's current state as her lactic acid was increasing and symptoms not improving. The family expressed understanding.   Dr. Mikey Bussing and Dr. Cyndie Chime returned to re-evaluate the patient at bedside after that previous discussion. At that time the daughter and granddaughter( via cell phone) were present.  We communicated again the clinical course and at this time discussed future goals.  The patient stated she did not want to be placed on  a ventilator if her symptoms worsened.  Later in the discussion the son and his wife were present.  Family members were in agreement to follow their mother's wishes and not pursue heroic meaures.  Patient was made a DO NOT RESUSCITATE.    Later that same night the night team was called  about [name] due to decreased responsiveness with decreased blood pressure. Dr. Obie Dredge went to evaluate the patient and found her to have agonal breathing with a reduced pulse and gradually decreasing HR. Her family was called at that time. Dr. Obie Dredge was able to reach the patient's daughter [name], who stated she was on her way to the hospital.   Patient passed away at May 12, 2351.  Signed: Jaci Standard, DO 09/25/2018, 12:50 PM

## 2018-10-16 NOTE — Progress Notes (Addendum)
CRITICAL VALUE ALERT  Critical Value:  Lactic acid 3.8  Date & Time Notied:  12-Oct-2018 0945  Provider Notified: MD paged  Orders Received/Actions taken: waiting for call back  Saga Balthazar Lynnell Dike   10/12/18 1004: Md notified, no verbal orders.  Elnita Maxwell, RN

## 2018-10-16 NOTE — Consult Note (Signed)
[address]       [place], [address]             [phone]      Cardiothoracic Surgery Consultation  Reason for Consult: Severe mitral stenosis with acute on chronic diastolic congestive heart failure and pulmonary edema Referring Physician: Dr. Letta Pate is an 84 y.o. female.  HPI:   The patient is an 84 year old woman with a history of hypertension, hyperlipidemia, atrial fibrillation, remote stroke, chronic combined systolic and diastolic congestive heart failure, and mitral valve prolapse status post mitral valve repair in 2010 by Dr. Roxy Manns.  She had a quadrangular resection of the posterior leaflet with a core matrix patch augmentation of the posterior leaflet and a 30 mm ring angioplasty through a right minithoracotomy approach.  She was last seen by Dr. Roxy Manns on 12/20/2014 for consideration of mitral valve replacement for severe mitral stenosis.  She was felt to be doing fairly well at that time with controlled symptoms and continued medical therapy was chosen.  He was planning to see her back in 6 months for follow-up but there are no further notes of any visits with him.  The patient has apparently seen Dr. Einar Gip several times since.  The patient is unable to give any history due to shortness of breath and restlessness.  She lives with her daughter who is here with her now.  Her daughter said that she has done fairly well and has still been relatively active until she developed acute onset of shortness of breath on Thursday.  Reportedly the patient could not sleep all night and woke her daughter up early in the morning and told her about the shortness of breath.  Her daughter reports that she has had some bilateral leg swelling for a while.  Her daughter said that last Saturday they were out shopping all day.  The chest x-ray on admission showed pulmonary vascular congestion.  Her troponin was 0.05 and 0.07.  BNP was 693.  A 2D echocardiogram yesterday  showed an ejection fraction of 65 to 70%.  There is severe mitral stenosis with a mean gradient of 15 mmHg and a peak of 19 mmHg.  The valve area by continuity equation was 0.68 cm.  The left atrium was severely dilated.  She has become more short of breath since admission with a rising lactate level today.  It was 2.13 at noon yesterday and rose to 4.0 this afternoon.  She has been hemodynamically stable.  Her creatinine was 1.23 on admission and liver function profile was normal.  Hemoglobin was 13.9.  Past Medical History:  Diagnosis Date  . Acute ischemic stroke ([facility]) 10/31/2014  . Anemia   . Arthritis   . Atrial fibrillation ([facility]) 2010  . Cerebral infarction due to unspecified mechanism 10/31/2014   Right frontal small thrombotic stroke   . Chronic combined systolic and diastolic CHF, NYHA class 2 ([facility])   . CVA (cerebral infarction) 10/31/2014  . Family history of anesthesia complication    Daughter; takes a long time to wake up  . Frequency of urination    at time  . GERD (gastroesophageal reflux disease)   . Hyperlipemia   . Hypertension   . Mitral stenosis   . Postoperative anemia due to acute blood loss 10/27/2014  . Pulmonary hypertension ([place])   . S/P minimally invasive mitral valve repair 07/05/2009   Complex valvuloplasty including quadrangular resection of posterior leaflet with CorMatrix patch  augmentation of posterior leaflet and 30 mm Sorin Memo 3-D ring annuloplasty via right mini thoracotomy approach   . Shortness of breath    while walking  . Upper GI bleed 2010   Reportedly normal EGD and colonoscopy    Past Surgical History:  Procedure Laterality Date  . ABDOMINAL HYSTERECTOMY    . CHOLECYSTECTOMY    . EYE SURGERY Bilateral    cataract removed; lens implant  . MITRAL VALVE REPAIR  07/05/2009   quadrangular resection of posterior leaflet with CorMatrix patch  . PATENT FORAMEN OVALE CLOSURE     07/05/2009  . TEE WITHOUT CARDIOVERSION N/A 11/02/2014    Procedure: TRANSESOPHAGEAL ECHOCARDIOGRAM (TEE);  Surgeon: Thayer Headings, MD;  Location: [facility];  Service: Cardiovascular;  Laterality: N/A;  . TOTAL KNEE ARTHROPLASTY Right 06/03/2014   Procedure: RIGHT TOTAL KNEE ARTHROPLASTY ;  Surgeon: Alta Corning, MD;  Location: [facility];  Service: Orthopedics;  Laterality: Right;  . TOTAL KNEE ARTHROPLASTY Left 10/26/2014   Procedure: LEFT TOTAL KNEE ARTHROPLASTY;  Surgeon: Alta Corning, MD;  Location: [facility];  Service: Orthopedics;  Laterality: Left;    Family History  Problem Relation Age of Onset  . Hypertension Mother   . Hyperlipidemia Mother   . Alzheimer's disease Mother   . Hypertension Father   . Cerebral aneurysm Father     Social History:  reports that she quit smoking about 10 years ago. She has never used smokeless tobacco. She reports that she does not drink alcohol or use drugs.  Allergies: No Known Allergies  Medications:  I have reviewed the patient's current medications. Prior to Admission:  Medications Prior to Admission  Medication Sig Dispense Refill Last Dose  . acetaminophen (TYLENOL) 650 MG CR tablet Take 1,300 mg by mouth 2 (two) times daily.   09/22/2018 at Unknown time  . Artificial Tear Ointment (DRY EYES OP) Place 2 drops into both eyes as needed.   09/15/2018  . aspirin EC 81 MG tablet Take 81 mg by mouth daily.   10/01/2018 at Unknown time  . clopidogrel (PLAVIX) 75 MG tablet Take 1 tablet (75 mg total) by mouth daily. 30 tablet 6 09/15/2018 at Unknown time  . diltiazem (DILACOR XR) 240 MG 24 hr capsule Take 240 mg by mouth daily.   10/15/2018 at Unknown time  . ferrous gluconate (FERGON) 324 MG tablet Take 324 mg by mouth daily with breakfast.   10/13/2018 at Unknown time  . hydrALAZINE (APRESOLINE) 25 MG tablet Take 75 mg by mouth at bedtime.  2 09/17/2018 at Unknown time  . metoprolol tartrate (LOPRESSOR) 25 MG tablet Take 25 mg by mouth 2 (two) times daily.    10/15/2018 at 0630  . omeprazole (PRILOSEC) 20 MG  capsule Take 20 mg by mouth daily.   10/11/2018 at Unknown time  . simvastatin (ZOCOR) 20 MG tablet Take 20 mg by mouth at bedtime.    09/17/2018 at Unknown time   Scheduled: . albuterol  5 mg Nebulization Once  . diltiazem  240 mg Oral Daily  . enoxaparin (LOVENOX) injection  30 mg Subcutaneous Q24H  . [START ON 10/07/2018] furosemide  20 mg Intravenous BID  . hydrALAZINE  75 mg Oral QHS  . mouth rinse  15 mL Mouth Rinse BID  . metoprolol tartrate  50 mg Oral BID  . potassium chloride  40 mEq Oral BID  . simvastatin  20 mg Oral QHS   Continuous:  WCB:[id number] **OR** acetaminophen, albuterol, alum & mag hydroxide-simeth,  guaiFENesin-dextromethorphan, Influenza vac split quadrivalent PF, promethazine, senna-docusate  Results for orders placed or performed during the hospital encounter of 09/23/2018 (from the past 48 hour(s))  CBC with Differential     Status: None   Collection Time: 10/01/2018  9:30 AM  Result Value Ref Range   WBC 8.0 4.0 - 10.5 K/uL   RBC 4.89 3.87 - 5.11 MIL/uL   Hemoglobin 13.9 12.0 - 15.0 g/dL   HCT 44.3 36.0 - 46.0 %   MCV 90.6 78.0 - 100.0 fL   MCH 28.4 26.0 - 34.0 pg   MCHC 31.4 30.0 - 36.0 g/dL   RDW 13.6 11.5 - 15.5 %   Platelets 218 150 - 400 K/uL   Neutrophils Relative % 63 %   Neutro Abs 5.0 1.7 - 7.7 K/uL   Lymphocytes Relative 28 %   Lymphs Abs 2.2 0.7 - 4.0 K/uL   Monocytes Relative 9 %   Monocytes Absolute 0.7 0.1 - 1.0 K/uL   Eosinophils Relative 0 %   Eosinophils Absolute 0.0 0.0 - 0.7 K/uL   Basophils Relative 0 %   Basophils Absolute 0.0 0.0 - 0.1 K/uL   Immature Granulocytes 0 %   Abs Immature Granulocytes 0.0 0.0 - 0.1 K/uL    Comment: Performed at [facility], [address], [place], [place] [address]  Comprehensive metabolic panel     Status: Abnormal   Collection Time: 10/06/2018  9:30 AM  Result Value Ref Range   Sodium 138 135 - 145 mmol/L   Potassium 3.8 3.5 - 5.1 mmol/L    Comment: SLIGHT HEMOLYSIS   Chloride 104 98 -  111 mmol/L   CO2 23 22 - 32 mmol/L   Glucose, Bld 109 (H) 70 - 99 mg/dL   BUN 16 8 - 23 mg/dL   Creatinine, Ser 1.23 (H) 0.44 - 1.00 mg/dL   Calcium 9.3 8.9 - 10.3 mg/dL   Total Protein 6.7 6.5 - 8.1 g/dL   Albumin 3.9 3.5 - 5.0 g/dL   AST 22 15 - 41 U/L   ALT 8 0 - 44 U/L   Alkaline Phosphatase 63 38 - 126 U/L   Total Bilirubin 0.8 0.3 - 1.2 mg/dL   GFR calc non Af Amer 40 (L) >60 mL/min   GFR calc Af Amer 46 (L) >60 mL/min    Comment: (NOTE) The eGFR has been calculated using the CKD EPI equation. This calculation has not been validated in all clinical situations. eGFR's persistently <60 mL/min signify possible Chronic Kidney Disease.    Anion gap 11 5 - 15    Comment: Performed at [facility] [address] [place], [place] [address]  Brain natriuretic peptide     Status: Abnormal   Collection Time: 10/03/2018  9:30 AM  Result Value Ref Range   B Natriuretic Peptide 693.3 (H) 0.0 - 100.0 pg/mL    Comment: Performed at [facility] [address], [place], [place] [address]  Lipase, blood     Status: None   Collection Time: 09/30/2018  9:30 AM  Result Value Ref Range   Lipase 29 11 - 51 U/L    Comment: Performed at [facility] [address], [place], [place] [address]  I-stat troponin, ED     Status: None   Collection Time: 10/03/2018 10:00 AM  Result Value Ref Range   Troponin i, poc 0.05 0.00 - 0.08 ng/mL   Comment 3  Comment: Due to the release kinetics of cTnI, a negative result within the first hours of the onset of symptoms does not rule out myocardial infarction with certainty. If myocardial infarction is still suspected, repeat the test at appropriate intervals.   I-Stat CG4 Lactic Acid, ED     Status: Abnormal   Collection Time: 09/16/2018 10:03 AM  Result Value Ref Range   Lactic Acid, Venous 2.35 (HH) 0.5 - 1.9 mmol/L   Comment NOTIFIED PHYSICIAN   Urinalysis, Routine w reflex microscopic     Status: Abnormal   Collection Time:  10/03/2018 12:12 PM  Result Value Ref Range   Color, Urine YELLOW YELLOW   APPearance HAZY (A) CLEAR   Specific Gravity, Urine 1.033 (H) 1.005 - 1.030   pH 5.0 5.0 - 8.0   Glucose, UA NEGATIVE NEGATIVE mg/dL   Hgb urine dipstick NEGATIVE NEGATIVE   Bilirubin Urine NEGATIVE NEGATIVE   Ketones, ur NEGATIVE NEGATIVE mg/dL   Protein, ur NEGATIVE NEGATIVE mg/dL   Nitrite NEGATIVE NEGATIVE   Leukocytes, UA TRACE (A) NEGATIVE   RBC / HPF 0-5 0 - 5 RBC/hpf   WBC, UA 0-5 0 - 5 WBC/hpf   Bacteria, UA RARE (A) NONE SEEN   Squamous Epithelial / LPF 0-5 0 - 5   Mucus PRESENT    Hyaline Casts, UA PRESENT     Comment: Performed at [facility], [address], [place], [place] [address]  Urine culture     Status: None (Preliminary result)   Collection Time: 10/03/2018 12:12 PM  Result Value Ref Range   Specimen Description URINE, RANDOM    Special Requests NONE    Culture      CULTURE REINCUBATED FOR BETTER GROWTH Performed at [facility], [place] [address], [place], [place] [address]    Report Status PENDING   I-stat troponin, ED     Status: None   Collection Time: 09/22/2018 12:51 PM  Result Value Ref Range   Troponin i, poc 0.07 0.00 - 0.08 ng/mL   Comment 3            Comment: Due to the release kinetics of cTnI, a negative result within the first hours of the onset of symptoms does not rule out myocardial infarction with certainty. If myocardial infarction is still suspected, repeat the test at appropriate intervals.   I-Stat CG4 Lactic Acid, ED     Status: Abnormal   Collection Time: 09/17/2018 12:54 PM  Result Value Ref Range   Lactic Acid, Venous 2.13 (HH) 0.5 - 1.9 mmol/L   Comment NOTIFIED PHYSICIAN   Procalcitonin - Baseline     Status: None   Collection Time: 10/12/2018  5:36 PM  Result Value Ref Range   Procalcitonin <0.10 ng/mL    Comment:        Interpretation: PCT (Procalcitonin) <= 0.5 ng/mL: Systemic infection (sepsis) is not likely. Local bacterial infection  is possible. (NOTE)       Sepsis PCT Algorithm           Lower Respiratory Tract                                      Infection PCT Algorithm    ----------------------------     ----------------------------         PCT < 0.25 ng/mL  PCT < 0.10 ng/mL         Strongly encourage             Strongly discourage   discontinuation of antibiotics    initiation of antibiotics    ----------------------------     -----------------------------       PCT 0.25 - 0.50 ng/mL            PCT 0.10 - 0.25 ng/mL               OR       >80% decrease in PCT            Discourage initiation of                                            antibiotics      Encourage discontinuation           of antibiotics    ----------------------------     -----------------------------         PCT >= 0.50 ng/mL              PCT 0.26 - 0.50 ng/mL               AND        <80% decrease in PCT             Encourage initiation of                                             antibiotics       Encourage continuation           of antibiotics    ----------------------------     -----------------------------        PCT >= 0.50 ng/mL                  PCT > 0.50 ng/mL               AND         increase in PCT                  Strongly encourage                                      initiation of antibiotics    Strongly encourage escalation           of antibiotics                                     -----------------------------                                           PCT <= 0.25 ng/mL                                                 OR                                        >  80% decrease in PCT                                     Discontinue / Do not initiate                                             antibiotics Performed at [facility], [place] [address], [place], [place] [address]   Respiratory Panel by PCR     Status: None   Collection Time: 09/28/2018  6:19 PM  Result Value Ref Range   Adenovirus NOT  DETECTED NOT DETECTED   Coronavirus 229E NOT DETECTED NOT DETECTED   Coronavirus HKU1 NOT DETECTED NOT DETECTED   Coronavirus NL63 NOT DETECTED NOT DETECTED   Coronavirus OC43 NOT DETECTED NOT DETECTED   Metapneumovirus NOT DETECTED NOT DETECTED   Rhinovirus / Enterovirus NOT DETECTED NOT DETECTED   Influenza A NOT DETECTED NOT DETECTED   Influenza B NOT DETECTED NOT DETECTED   Parainfluenza Virus 1 NOT DETECTED NOT DETECTED   Parainfluenza Virus 2 NOT DETECTED NOT DETECTED   Parainfluenza Virus 3 NOT DETECTED NOT DETECTED   Parainfluenza Virus 4 NOT DETECTED NOT DETECTED   Respiratory Syncytial Virus NOT DETECTED NOT DETECTED   Bordetella pertussis NOT DETECTED NOT DETECTED   Chlamydophila pneumoniae NOT DETECTED NOT DETECTED   Mycoplasma pneumoniae NOT DETECTED NOT DETECTED    Comment: Performed at [facility] [address], [place], [place] [address]  Basic metabolic panel     Status: Abnormal   Collection Time: September 22, 2018  6:23 AM  Result Value Ref Range   Sodium 138 135 - 145 mmol/L   Potassium 3.1 (L) 3.5 - 5.1 mmol/L   Chloride 105 98 - 111 mmol/L   CO2 18 (L) 22 - 32 mmol/L   Glucose, Bld 139 (H) 70 - 99 mg/dL   BUN 24 (H) 8 - 23 mg/dL   Creatinine, Ser 1.29 (H) 0.44 - 1.00 mg/dL   Calcium 9.2 8.9 - 10.3 mg/dL   GFR calc non Af Amer 38 (L) >60 mL/min   GFR calc Af Amer 44 (L) >60 mL/min    Comment: (NOTE) The eGFR has been calculated using the CKD EPI equation. This calculation has not been validated in all clinical situations. eGFR's persistently <60 mL/min signify possible Chronic Kidney Disease.    Anion gap 15 5 - 15    Comment: Performed at [facility] [address] [place], [place] [address]  Lactic acid, plasma     Status: Abnormal   Collection Time: 2018-09-22  7:44 AM  Result Value Ref Range   Lactic Acid, Venous 3.8 (HH) 0.5 - 1.9 mmol/L    Comment: CRITICAL RESULT CALLED TO, READ BACK BY AND VERIFIED WITH: Newman Nickels AT 2683 09/22/2018 BY L  BENFIELD Performed at [facility], [place] [address], [place], [place] [address]   Lactic acid, plasma     Status: Abnormal   Collection Time: 09-22-18 10:22 AM  Result Value Ref Range   Lactic Acid, Venous 3.5 (HH) 0.5 - 1.9 mmol/L    Comment: CRITICAL RESULT CALLED TO, READ BACK BY AND VERIFIED WITH: S CAMPBELL,RN AT 1141 22-Sep-2018 BY L BENFIELD Performed at [facility], [place] [address] [place], [place] [address]  Lactic acid, plasma     Status: Abnormal   Collection Time: 10/09/2018  1:06 PM  Result Value Ref Range   Lactic Acid, Venous 4.0 (HH) 0.5 - 1.9 mmol/L    Comment: CRITICAL RESULT CALLED TO, READ BACK BY AND VERIFIED WITH: A IDI,RN AT 1408 10-09-18 BY L BENFIELD Performed at [facility], [place] [address], [place],  [address]     Dg Chest 2 View  Result Date: 10/06/2018 CLINICAL DATA:  Shortness of Breath EXAM: CHEST - 2 VIEW COMPARISON:  May 25, 2014 FINDINGS: There is cardiomegaly with pulmonary venous hypertension. There is interstitial edema with small left pleural effusion. No consolidation. Patient is status post mitral valve replacement. There is aortic atherosclerosis. No adenopathy. No bone lesions. IMPRESSION: Pulmonary vascular congestion with interstitial edema and small left pleural effusion. Suspect a degree of congestive heart failure. No consolidation. Status post mitral valve replacement. There is aortic atherosclerosis. Aortic Atherosclerosis (ICD10-I70.0). Electronically Signed   By: Lowella Grip III M.D.   On: 10/02/2018 09:02   Dg Abd 1 View  Result Date: Oct 09, 2018 CLINICAL DATA:  Nausea and vomiting. EXAM: ABDOMEN - 1 VIEW COMPARISON:  None. FINDINGS: The bowel gas pattern is normal. No radio-opaque calculi or other significant radiographic abnormality are seen. Cholecystectomy clips and clips over the right inferior pubic ramus are noted. Atherosclerosis is identified. IMPRESSION: No acute finding. Atherosclerosis. Electronically  Signed   By: Inge Rise M.D.   On: 10-09-2018 14:09    Review of Systems  Respiratory: Positive for shortness of breath.   Cardiovascular: Positive for orthopnea, leg swelling and PND. Negative for chest pain.  Gastrointestinal: Positive for nausea and vomiting.  Musculoskeletal: Positive for joint pain.   Blood pressure 119/88, pulse 60, temperature (!) 94.6 F (34.8 C), resp. rate (!) 28, height '4\' 11"'$  (1.499 m), weight 62.4 kg, SpO2 93 %. Physical Exam  Constitutional:  Elderly woman sitting straight up in bed who is dyspneic and restless.  HENT:  Head: Normocephalic and atraumatic.  Eyes: Pupils are equal, round, and reactive to light. EOM are normal.  Neck: No JVD present.  Cardiovascular: Normal rate, regular rhythm, normal heart sounds and intact distal pulses.  No murmur heard. Respiratory: She is in respiratory distress. She has rales.  GI: Soft. Bowel sounds are normal. She exhibits no distension. There is no tenderness.  Musculoskeletal: She exhibits edema.  Neurological:  Restless but follows commands.    Assessment/Plan:  This 84 year old woman has severe mitral stenosis status post mitral valve repair in 2010 presenting with acute on chronic diastolic congestive heart failure with pulmonary edema on chest x-ray, shortness of breath at rest with orthopnea and PND.  She has been hemodynamically stable but her lactate level has been elevated suggesting the possibility of a low cardiac output state.  So far her creatinine has been stable at 1.29.  She had been doing fairly well at home until this recent decompensation.  There is no indication for emergent surgical treatment in this 84 year old patient with prior mitral valve repair and pulmonary edema.  The goal at this point should be treatment of her congestive heart failure with diuretics hopefully to get her in condition to tolerate a TEE and right/left heart cath.  She could then be considered for possible open  surgical mitral valve replacement or transcatheter valve in ring replacement.  I discussed this with the patient's daughter and grandson at the bedside.  We will follow her progress and I will discuss  with Dr. Roxy Manns on Monday morning.  Fernande Boyden Bartle 09/23/18, 4:07 PM

## 2018-10-16 NOTE — Significant Event (Addendum)
Rapid Response Event Note:  Called by RN about patient's HR being in the 30s, agonal breathing, and decreased LOC. I asked the RN to call the IMTS MDs and I  immediately went to assess the patient. Upon arrival, HR was in the 20s-30s, + agonal breathing, and patient was unresponsive. MDs arrived at bedside. Family notified.   Patient passed away 05-07-51 confirmed by 2 4E RNs.  Event Summary:  Call Time 05-06-32 Arrival Time 2234-05-06 End Time 2355    at    Peniel Biel R

## 2018-10-16 DEATH — deceased
